# Patient Record
Sex: Female | Born: 1985 | Race: White | Hispanic: No | Marital: Married | Smoking: Former smoker
Health system: Southern US, Community
[De-identification: ages and names within clinical notes are randomized; demographics above are authoritative.]

## PROBLEM LIST (undated history)

## (undated) ENCOUNTER — Inpatient Hospital Stay (HOSPITAL_COMMUNITY): Payer: Self-pay

## (undated) ENCOUNTER — Inpatient Hospital Stay (HOSPITAL_COMMUNITY): Admission: AD | Payer: Federal, State, Local not specified - Other | Source: Intra-hospital | Admitting: Psychiatry

## (undated) DIAGNOSIS — R569 Unspecified convulsions: Secondary | ICD-10-CM

## (undated) DIAGNOSIS — F1020 Alcohol dependence, uncomplicated: Secondary | ICD-10-CM

## (undated) DIAGNOSIS — J40 Bronchitis, not specified as acute or chronic: Secondary | ICD-10-CM

## (undated) DIAGNOSIS — K759 Inflammatory liver disease, unspecified: Secondary | ICD-10-CM

## (undated) DIAGNOSIS — F32A Depression, unspecified: Secondary | ICD-10-CM

## (undated) DIAGNOSIS — J189 Pneumonia, unspecified organism: Secondary | ICD-10-CM

## (undated) DIAGNOSIS — F329 Major depressive disorder, single episode, unspecified: Secondary | ICD-10-CM

## (undated) HISTORY — DX: Major depressive disorder, single episode, unspecified: F32.9

## (undated) HISTORY — DX: Depression, unspecified: F32.A

## (undated) HISTORY — DX: Unspecified convulsions: R56.9

---

## 2006-04-13 HISTORY — PX: BREAST ENHANCEMENT SURGERY: SHX7

## 2016-06-12 ENCOUNTER — Inpatient Hospital Stay (HOSPITAL_COMMUNITY)
Admission: EM | Admit: 2016-06-12 | Discharge: 2016-06-14 | DRG: 897 | Disposition: A | Discharge status: Home / Self Care | Payer: Self-pay | Attending: Internal Medicine | Admitting: Internal Medicine

## 2016-06-12 ENCOUNTER — Emergency Department (HOSPITAL_COMMUNITY): Payer: Self-pay

## 2016-06-12 ENCOUNTER — Encounter (HOSPITAL_COMMUNITY): Payer: Self-pay | Admitting: Emergency Medicine

## 2016-06-12 DIAGNOSIS — G4089 Other seizures: Secondary | ICD-10-CM | POA: Diagnosis present

## 2016-06-12 DIAGNOSIS — F10239 Alcohol dependence with withdrawal, unspecified: Secondary | ICD-10-CM

## 2016-06-12 DIAGNOSIS — F10939 Alcohol use, unspecified with withdrawal, unspecified: Secondary | ICD-10-CM

## 2016-06-12 DIAGNOSIS — F10230 Alcohol dependence with withdrawal, uncomplicated: Secondary | ICD-10-CM

## 2016-06-12 DIAGNOSIS — F1010 Alcohol abuse, uncomplicated: Secondary | ICD-10-CM | POA: Diagnosis present

## 2016-06-12 DIAGNOSIS — R569 Unspecified convulsions: Secondary | ICD-10-CM

## 2016-06-12 DIAGNOSIS — F10930 Alcohol use, unspecified with withdrawal, uncomplicated: Secondary | ICD-10-CM

## 2016-06-12 DIAGNOSIS — Z91011 Allergy to milk products: Secondary | ICD-10-CM

## 2016-06-12 DIAGNOSIS — F10231 Alcohol dependence with withdrawal delirium: Principal | ICD-10-CM | POA: Diagnosis present

## 2016-06-12 DIAGNOSIS — K7010 Alcoholic hepatitis without ascites: Secondary | ICD-10-CM | POA: Diagnosis present

## 2016-06-12 LAB — CBC WITH DIFFERENTIAL/PLATELET
BASOS PCT: 0 %
Basophils Absolute: 0 10*3/uL (ref 0.0–0.1)
EOS ABS: 0 10*3/uL (ref 0.0–0.7)
EOS PCT: 0 %
HCT: 38.3 % (ref 36.0–46.0)
HEMOGLOBIN: 13.5 g/dL (ref 12.0–15.0)
Lymphocytes Relative: 16 %
Lymphs Abs: 0.4 10*3/uL — ABNORMAL LOW (ref 0.7–4.0)
MCH: 33.4 pg (ref 26.0–34.0)
MCHC: 35.2 g/dL (ref 30.0–36.0)
MCV: 94.8 fL (ref 78.0–100.0)
Monocytes Absolute: 0.2 10*3/uL (ref 0.1–1.0)
Monocytes Relative: 8 %
Neutro Abs: 2.1 10*3/uL (ref 1.7–7.7)
Neutrophils Relative %: 76 %
PLATELETS: DECREASED 10*3/uL (ref 150–400)
RBC: 4.04 MIL/uL (ref 3.87–5.11)
RDW: 14.2 % (ref 11.5–15.5)
WBC: 2.8 10*3/uL — ABNORMAL LOW (ref 4.0–10.5)

## 2016-06-12 LAB — I-STAT CHEM 8, ED
CALCIUM ION: 1.04 mmol/L — AB (ref 1.15–1.40)
CHLORIDE: 90 mmol/L — AB (ref 101–111)
CREATININE: 0.5 mg/dL (ref 0.44–1.00)
Glucose, Bld: 97 mg/dL (ref 65–99)
HCT: 41 % (ref 36.0–46.0)
Hemoglobin: 13.9 g/dL (ref 12.0–15.0)
Potassium: 3.5 mmol/L (ref 3.5–5.1)
Sodium: 132 mmol/L — ABNORMAL LOW (ref 135–145)
TCO2: 29 mmol/L (ref 0–100)

## 2016-06-12 LAB — HEPATIC FUNCTION PANEL
ALK PHOS: 101 U/L (ref 38–126)
ALT: 154 U/L — ABNORMAL HIGH (ref 14–54)
AST: 363 U/L — AB (ref 15–41)
Albumin: 4 g/dL (ref 3.5–5.0)
BILIRUBIN DIRECT: 0.6 mg/dL — AB (ref 0.1–0.5)
BILIRUBIN TOTAL: 1.9 mg/dL — AB (ref 0.3–1.2)
Indirect Bilirubin: 1.3 mg/dL — ABNORMAL HIGH (ref 0.3–0.9)
Total Protein: 7.1 g/dL (ref 6.5–8.1)

## 2016-06-12 LAB — RAPID HIV SCREEN (HIV 1/2 AB+AG)
HIV 1/2 Antibodies: NONREACTIVE
HIV-1 P24 ANTIGEN - HIV24: NONREACTIVE

## 2016-06-12 LAB — POC URINE PREG, ED: Preg Test, Ur: NEGATIVE

## 2016-06-12 LAB — PLATELET COUNT: PLATELETS: DECREASED 10*3/uL (ref 150–400)

## 2016-06-12 LAB — ETHANOL

## 2016-06-12 MED ORDER — VITAMIN B-1 100 MG PO TABS
100.0000 mg | ORAL_TABLET | Freq: Every day | ORAL | Status: DC
  Filled 2016-06-12: qty 1

## 2016-06-12 MED ORDER — VITAMIN B-1 100 MG PO TABS
100.0000 mg | ORAL_TABLET | Freq: Every day | ORAL | Status: DC
  Administered 2016-06-12 – 2016-06-14 (×3): 100 mg via ORAL
  Filled 2016-06-12 (×3): qty 1

## 2016-06-12 MED ORDER — LORAZEPAM 1 MG PO TABS
0.0000 mg | ORAL_TABLET | Freq: Four times a day (QID) | ORAL | Status: DC
Start: 2016-06-12 — End: 2016-06-14
  Administered 2016-06-12: 4 mg via ORAL
  Administered 2016-06-13: 1 mg via ORAL
  Administered 2016-06-13 (×2): 2 mg via ORAL
  Administered 2016-06-14: 1 mg via ORAL
  Filled 2016-06-12: qty 4
  Filled 2016-06-12 (×2): qty 1
  Filled 2016-06-12 (×2): qty 2

## 2016-06-12 MED ORDER — NAPROXEN SODIUM 220 MG PO TABS: 220.0000 mg | ORAL_TABLET | Freq: Two times a day (BID) | ORAL | Status: DC | PRN

## 2016-06-12 MED ORDER — LORAZEPAM 1 MG PO TABS
1.0000 mg | ORAL_TABLET | Freq: Four times a day (QID) | ORAL | Status: DC
  Administered 2016-06-12: 1 mg via ORAL
  Filled 2016-06-12: qty 1

## 2016-06-12 MED ORDER — SODIUM CHLORIDE 0.9 % IV SOLN
INTRAVENOUS | Status: AC
  Administered 2016-06-12: 22:00:00 via INTRAVENOUS

## 2016-06-12 MED ORDER — LORAZEPAM 1 MG PO TABS
1.0000 mg | ORAL_TABLET | Freq: Three times a day (TID) | ORAL | Status: DC
  Filled 2016-06-12: qty 1

## 2016-06-12 MED ORDER — HYDROXYZINE HCL 25 MG PO TABS: 25.0000 mg | ORAL_TABLET | Freq: Four times a day (QID) | ORAL | Status: DC | PRN

## 2016-06-12 MED ORDER — FOLIC ACID 1 MG PO TABS
1.0000 mg | ORAL_TABLET | Freq: Every day | ORAL | Status: DC
  Administered 2016-06-12 – 2016-06-14 (×3): 1 mg via ORAL
  Filled 2016-06-12 (×3): qty 1

## 2016-06-12 MED ORDER — ADULT MULTIVITAMIN W/MINERALS CH
1.0000 | ORAL_TABLET | Freq: Every day | ORAL | Status: DC
  Administered 2016-06-12 – 2016-06-14 (×3): 1 via ORAL
  Filled 2016-06-12 (×3): qty 1

## 2016-06-12 MED ORDER — LORAZEPAM 1 MG PO TABS: 0.0000 mg | ORAL_TABLET | Freq: Two times a day (BID) | ORAL | Status: DC

## 2016-06-12 MED ORDER — ONDANSETRON HCL 4 MG/2ML IJ SOLN: 4.0000 mg | Freq: Four times a day (QID) | INTRAMUSCULAR | Status: DC | PRN

## 2016-06-12 MED ORDER — LORAZEPAM 1 MG PO TABS: 1.0000 mg | ORAL_TABLET | Freq: Four times a day (QID) | ORAL | Status: DC | PRN

## 2016-06-12 MED ORDER — ADULT MULTIVITAMIN W/MINERALS CH
1.0000 | ORAL_TABLET | Freq: Every day | ORAL | Status: DC
  Administered 2016-06-12: 1 via ORAL
  Filled 2016-06-12: qty 1

## 2016-06-12 MED ORDER — VITAMIN B-1 100 MG PO TABS
100.0000 mg | ORAL_TABLET | Freq: Every day | ORAL | Status: DC
  Administered 2016-06-12: 100 mg via ORAL

## 2016-06-12 MED ORDER — ONDANSETRON HCL 4 MG PO TABS: 4.0000 mg | ORAL_TABLET | Freq: Four times a day (QID) | ORAL | Status: DC | PRN

## 2016-06-12 MED ORDER — LORAZEPAM 2 MG/ML IJ SOLN: 1.0000 mg | Freq: Four times a day (QID) | INTRAMUSCULAR | Status: DC | PRN

## 2016-06-12 MED ORDER — LORAZEPAM 1 MG PO TABS: 1.0000 mg | ORAL_TABLET | Freq: Every day | ORAL | Status: DC

## 2016-06-12 MED ORDER — LOPERAMIDE HCL 2 MG PO CAPS: 2.0000 mg | ORAL_CAPSULE | ORAL | Status: DC | PRN

## 2016-06-12 MED ORDER — LORAZEPAM 1 MG PO TABS: 1.0000 mg | ORAL_TABLET | Freq: Two times a day (BID) | ORAL | Status: DC

## 2016-06-12 MED ORDER — THIAMINE HCL 100 MG/ML IJ SOLN
100.0000 mg | Freq: Once | INTRAMUSCULAR | Status: DC
  Filled 2016-06-12: qty 2

## 2016-06-12 MED ORDER — SODIUM CHLORIDE 0.9% FLUSH
3.0000 mL | Freq: Two times a day (BID) | INTRAVENOUS | Status: DC
  Administered 2016-06-12 – 2016-06-14 (×3): 3 mL via INTRAVENOUS

## 2016-06-12 MED ORDER — THIAMINE HCL 100 MG/ML IJ SOLN: 100.0000 mg | Freq: Every day | INTRAMUSCULAR | Status: DC

## 2016-06-12 MED ORDER — ONDANSETRON 4 MG PO TBDP: 4.0000 mg | ORAL_TABLET | Freq: Four times a day (QID) | ORAL | Status: DC | PRN

## 2016-06-12 NOTE — ED Notes (Signed)
Attempted to call report
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
## 2016-06-13 DIAGNOSIS — F1010 Alcohol abuse, uncomplicated: Secondary | ICD-10-CM

## 2016-06-13 DIAGNOSIS — F10230 Alcohol dependence with withdrawal, uncomplicated: Secondary | ICD-10-CM

## 2016-06-13 DIAGNOSIS — R569 Unspecified convulsions: Secondary | ICD-10-CM

## 2016-06-13 DIAGNOSIS — F10239 Alcohol dependence with withdrawal, unspecified: Secondary | ICD-10-CM

## 2016-06-13 NOTE — Care Management Note (Signed)
Case Management Note  Patient Details  Name: Charlotterose Nissen MRN: [id number] Date of Birth: [birth year]  Subjective/Objective:  Receieved CM consult for outpt Substance Abuse treatment. Made referral to CSW.                   Action/Plan:CM will follow closely for disposition/discharge needs.    Expected Discharge Date:                  Expected Discharge Plan:  Home/Self Care  In-House Referral:  Clinical Social Work  Discharge planning Services  CM Consult  Post Acute Care Choice:    Choice offered to:     DME Arranged:    DME Agency:     HH Arranged:    HH Agency:     Status of Service:  In process, will continue to follow  If discussed at Long Length of Stay Meetings, dates discussed:    Additional Comments:  Delrae Sawyers, RN 06/13/2016, 4:25 PM
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
## 2016-06-14 LAB — COMPREHENSIVE METABOLIC PANEL
ALK PHOS: 106 U/L (ref 38–126)
ALT: 116 U/L — ABNORMAL HIGH (ref 14–54)
ANION GAP: 9 (ref 5–15)
AST: 194 U/L — ABNORMAL HIGH (ref 15–41)
Albumin: 3.6 g/dL (ref 3.5–5.0)
BILIRUBIN TOTAL: 1.7 mg/dL — AB (ref 0.3–1.2)
BUN: <5 mg/dL — ABNORMAL LOW (ref 6–20)
CALCIUM: 9.8 mg/dL (ref 8.9–10.3)
CO2: 28 mmol/L (ref 22–32)
Chloride: 99 mmol/L — ABNORMAL LOW (ref 101–111)
Creatinine, Ser: 0.65 mg/dL (ref 0.44–1.00)
Glucose, Bld: 120 mg/dL — ABNORMAL HIGH (ref 65–99)
Potassium: 4.9 mmol/L (ref 3.5–5.1)
Sodium: 136 mmol/L (ref 135–145)
TOTAL PROTEIN: 7.1 g/dL (ref 6.5–8.1)

## 2016-06-14 NOTE — Progress Notes (Signed)
Nsg Discharge Note  Admit Date:  06/12/2016 Discharge date: 06/14/2016   Sylvia Maxwell to be D/C'd Home per MD order.  AVS completed.  Copy for chart, and copy for patient signed, and dated. Patient/caregiver able to verbalize understanding.  Discharge Medication: Allergies as of 06/14/2016      Reactions   Lactose Intolerance (gi) Diarrhea, Nausea And Vomiting   Depends on the type of dairy-based product consumed as to which reaction occurs      Medication List    TAKE these medications   acetaminophen 325 MG tablet Commonly known as:  TYLENOL Take 325-650 mg by mouth every 6 (six) hours as needed (for headaches).   ALEVE 220 MG tablet Generic drug:  naproxen sodium Take 220-440 mg by mouth 2 (two) times daily as needed (for headaches).   b complex vitamins tablet Take 1 tablet by mouth daily.   folic acid A999333 MCG tablet Commonly known as:  FOLVITE Take 400-800 mcg by mouth daily.   ibuprofen 200 MG tablet Commonly known as:  ADVIL,MOTRIN Take 800 mg by mouth every 6 (six) hours as needed (for headaches).   MAGNESIUM PO Take 1 tablet by mouth daily.   multivitamin with minerals Tabs tablet Take 1 tablet by mouth daily.       Discharge Assessment: Vitals:   06/14/16 0658 06/14/16 1250  BP: 108/77 119/81  Pulse: 71 81  Resp: 18 18  Temp: 97.9 F (36.6 C) 98.5 F (36.9 C)   Skin clean, dry and intact without evidence of skin break down, no evidence of skin tears noted. IV catheter discontinued intact. Site without signs and symptoms of complications - no redness or edema noted at insertion site, patient denies c/o pain - only slight tenderness at site.  Dressing with slight pressure applied.  D/c Instructions-Education: Discharge instructions given to patient/family with verbalized understanding. D/c education completed with patient/family including follow up instructions, medication list, d/c activities limitations if indicated, with other d/c  instructions as indicated by MD - patient able to verbalize understanding, all questions fully answered. Patient instructed to return to ED, call 911, or call MD for any changes in condition.  Patient escorted via [place], and D/C home via private auto.  Salley Slaughter, RN 06/14/2016 3:07 PM

## 2016-06-14 NOTE — Discharge Instructions (Signed)
Avoid driving until cleared by PCP
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
## 2016-08-02 ENCOUNTER — Emergency Department (HOSPITAL_COMMUNITY)
Admission: EM | Admit: 2016-08-02 | Discharge: 2016-08-02 | Disposition: A | Discharge status: Home / Self Care | Payer: Self-pay | Attending: Emergency Medicine | Admitting: Emergency Medicine

## 2016-08-02 ENCOUNTER — Encounter (HOSPITAL_COMMUNITY): Payer: Self-pay

## 2016-08-02 DIAGNOSIS — Z79899 Other long term (current) drug therapy: Secondary | ICD-10-CM | POA: Insufficient documentation

## 2016-08-02 DIAGNOSIS — F17210 Nicotine dependence, cigarettes, uncomplicated: Secondary | ICD-10-CM | POA: Insufficient documentation

## 2016-08-02 DIAGNOSIS — Y999 Unspecified external cause status: Secondary | ICD-10-CM | POA: Insufficient documentation

## 2016-08-02 DIAGNOSIS — Z7289 Other problems related to lifestyle: Secondary | ICD-10-CM

## 2016-08-02 DIAGNOSIS — R0981 Nasal congestion: Secondary | ICD-10-CM | POA: Insufficient documentation

## 2016-08-02 DIAGNOSIS — Y92410 Unspecified street and highway as the place of occurrence of the external cause: Secondary | ICD-10-CM | POA: Insufficient documentation

## 2016-08-02 DIAGNOSIS — F1010 Alcohol abuse, uncomplicated: Secondary | ICD-10-CM | POA: Insufficient documentation

## 2016-08-02 DIAGNOSIS — Y939 Activity, unspecified: Secondary | ICD-10-CM | POA: Insufficient documentation

## 2016-08-02 DIAGNOSIS — F1090 Alcohol use, unspecified, uncomplicated: Secondary | ICD-10-CM

## 2016-08-02 DIAGNOSIS — S301XXA Contusion of abdominal wall, initial encounter: Secondary | ICD-10-CM | POA: Insufficient documentation

## 2016-08-02 MED ORDER — LORATADINE 10 MG PO TABS: 10.0000 mg | ORAL_TABLET | Freq: Every day | ORAL | 0 refills | Status: DC

## 2016-08-02 MED ORDER — IBUPROFEN 800 MG PO TABS: 800.0000 mg | ORAL_TABLET | Freq: Three times a day (TID) | ORAL | 0 refills | Status: DC | PRN

## 2016-08-02 NOTE — ED Notes (Addendum)
Dr.Knott at bedside. 
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
## 2016-08-02 NOTE — ED Notes (Signed)
Pt given ice pack
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
## 2016-09-27 ENCOUNTER — Encounter (HOSPITAL_COMMUNITY): Payer: Self-pay

## 2016-09-27 ENCOUNTER — Emergency Department (HOSPITAL_COMMUNITY): Payer: Self-pay

## 2016-09-27 ENCOUNTER — Emergency Department (HOSPITAL_COMMUNITY)
Admission: EM | Admit: 2016-09-27 | Discharge: 2016-09-27 | Disposition: A | Discharge status: Home / Self Care | Payer: Self-pay | Attending: Emergency Medicine | Admitting: Emergency Medicine

## 2016-09-27 DIAGNOSIS — W010XXA Fall on same level from slipping, tripping and stumbling without subsequent striking against object, initial encounter: Secondary | ICD-10-CM | POA: Insufficient documentation

## 2016-09-27 DIAGNOSIS — S63502A Unspecified sprain of left wrist, initial encounter: Secondary | ICD-10-CM

## 2016-09-27 DIAGNOSIS — Y999 Unspecified external cause status: Secondary | ICD-10-CM | POA: Insufficient documentation

## 2016-09-27 DIAGNOSIS — F17210 Nicotine dependence, cigarettes, uncomplicated: Secondary | ICD-10-CM | POA: Insufficient documentation

## 2016-09-27 DIAGNOSIS — Y9301 Activity, walking, marching and hiking: Secondary | ICD-10-CM | POA: Insufficient documentation

## 2016-09-27 DIAGNOSIS — Y929 Unspecified place or not applicable: Secondary | ICD-10-CM | POA: Insufficient documentation

## 2016-09-27 DIAGNOSIS — M25532 Pain in left wrist: Secondary | ICD-10-CM

## 2016-09-27 DIAGNOSIS — W19XXXA Unspecified fall, initial encounter: Secondary | ICD-10-CM

## 2016-09-27 DIAGNOSIS — Z79899 Other long term (current) drug therapy: Secondary | ICD-10-CM | POA: Insufficient documentation

## 2016-09-27 MED ORDER — HYDROCODONE-ACETAMINOPHEN 5-325 MG PO TABS: 1.0000 | ORAL_TABLET | Freq: Four times a day (QID) | ORAL | 0 refills | Status: DC | PRN

## 2016-09-27 NOTE — ED Triage Notes (Signed)
Onset 1 hour PTA pt was walking two dogs and got tangled up and pt fell.  Obvious deformity to left wrist.
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
## 2016-09-27 NOTE — ED Notes (Signed)
Waiting on ortho 

## 2016-09-27 NOTE — Discharge Instructions (Signed)
Do not take your prescription pain medication tonight.  Please take Ibuprofen (Advil, motrin) and Tylenol (acetaminophen) to relieve your pain.  You may take up to 800 MG (4 pills) of normal strength ibuprofen every 8 hours as needed.  In between doses of ibuprofen you make take tylenol, up to 1,000 mg (two extra strength pills).  Do not take more than 3,000 mg tylenol in a 24 hour period.  Please check all medication labels as many medications such as pain and cold medications may contain tylenol.  Do not drink alcohol while taking these medications.  Do not take other NSAID'S while taking ibuprofen (such as aleve or naproxen).  Please take the ibuprofen with food to reduce stomach upset. Please be aware that your prescription pain medication contains Tylenol which needs to be counted in your daily allowance. Please only take a prescription pain medication if your pain is unable to be controlled with ibuprofen, Tylenol, ice, and elevation.   You are being prescribed a medication which may make you sleepy. Please please do not drive, operate heavy machinery, care for a small child with out another adult present, or perform any activities that may cause harm to you or someone else if you were to fall asleep or be impaired for 24 hours after one dose until you know how it effects you.

## 2016-09-27 NOTE — Progress Notes (Signed)
Orthopedic Tech Progress Note Patient Details:  Sylvia Maxwell [facility] 1985-08-27 [id number]  Ortho Devices Type of Ortho Device: Thumb velcro splint Ortho Device/Splint Location: lue Ortho Device/Splint Interventions: Ordered, Application, Adjustment   Karolee Stamps 09/27/2016, 9:30 PM

## 2017-01-29 ENCOUNTER — Encounter (HOSPITAL_COMMUNITY): Payer: Self-pay

## 2017-01-29 ENCOUNTER — Emergency Department (HOSPITAL_COMMUNITY)
Admission: EM | Admit: 2017-01-29 | Discharge: 2017-01-31 | Disposition: A | Discharge status: Home / Self Care | Payer: Self-pay | Attending: Emergency Medicine | Admitting: Emergency Medicine

## 2017-01-29 DIAGNOSIS — Z79899 Other long term (current) drug therapy: Secondary | ICD-10-CM | POA: Insufficient documentation

## 2017-01-29 DIAGNOSIS — Y908 Blood alcohol level of 240 mg/100 ml or more: Secondary | ICD-10-CM | POA: Insufficient documentation

## 2017-01-29 DIAGNOSIS — F17210 Nicotine dependence, cigarettes, uncomplicated: Secondary | ICD-10-CM | POA: Insufficient documentation

## 2017-01-29 DIAGNOSIS — F10920 Alcohol use, unspecified with intoxication, uncomplicated: Secondary | ICD-10-CM

## 2017-01-29 DIAGNOSIS — R45851 Suicidal ideations: Secondary | ICD-10-CM | POA: Insufficient documentation

## 2017-01-29 DIAGNOSIS — F329 Major depressive disorder, single episode, unspecified: Secondary | ICD-10-CM | POA: Insufficient documentation

## 2017-01-29 DIAGNOSIS — F10239 Alcohol dependence with withdrawal, unspecified: Secondary | ICD-10-CM | POA: Diagnosis present

## 2017-01-29 DIAGNOSIS — F332 Major depressive disorder, recurrent severe without psychotic features: Secondary | ICD-10-CM

## 2017-01-29 LAB — SALICYLATE LEVEL: Salicylate Lvl: <7 mg/dL (ref 2.8–30.0)

## 2017-01-29 LAB — CBC
HCT: 42.8 % (ref 36.0–46.0)
HEMOGLOBIN: 15 g/dL (ref 12.0–15.0)
MCH: 31.1 pg (ref 26.0–34.0)
MCHC: 35 g/dL (ref 30.0–36.0)
MCV: 88.6 fL (ref 78.0–100.0)
Platelets: 302 10*3/uL (ref 150–400)
RBC: 4.83 MIL/uL (ref 3.87–5.11)
RDW: 12.9 % (ref 11.5–15.5)
WBC: 6.3 10*3/uL (ref 4.0–10.5)

## 2017-01-29 LAB — COMPREHENSIVE METABOLIC PANEL
ALBUMIN: 4.2 g/dL (ref 3.5–5.0)
ALT: 25 U/L (ref 14–54)
AST: 30 U/L (ref 15–41)
Alkaline Phosphatase: 60 U/L (ref 38–126)
Anion gap: 11 (ref 5–15)
BUN: 13 mg/dL (ref 6–20)
CHLORIDE: 104 mmol/L (ref 101–111)
CO2: 26 mmol/L (ref 22–32)
CREATININE: 0.6 mg/dL (ref 0.44–1.00)
Calcium: 8.8 mg/dL — ABNORMAL LOW (ref 8.9–10.3)
GFR calc Af Amer: >60 mL/min (ref >60)
GFR calc non Af Amer: >60 mL/min (ref >60)
GLUCOSE: 100 mg/dL — AB (ref 65–99)
POTASSIUM: 4 mmol/L (ref 3.5–5.1)
SODIUM: 141 mmol/L (ref 135–145)
Total Bilirubin: 0.5 mg/dL (ref 0.3–1.2)
Total Protein: 7.9 g/dL (ref 6.5–8.1)

## 2017-01-29 LAB — ACETAMINOPHEN LEVEL: Acetaminophen (Tylenol), Serum: <10 ug/mL — ABNORMAL LOW (ref 10–30)

## 2017-01-29 LAB — I-STAT BETA HCG BLOOD, ED (MC, WL, AP ONLY): I-stat hCG, quantitative: <5 m[IU]/mL (ref <5)

## 2017-01-29 LAB — ETHANOL: Alcohol, Ethyl (B): 395 mg/dL (ref <10)

## 2017-01-29 MED ORDER — NICOTINE 21 MG/24HR TD PT24
21.0000 mg | MEDICATED_PATCH | Freq: Every day | TRANSDERMAL | Status: DC
  Filled 2017-01-29: qty 1

## 2017-01-29 MED ORDER — LORAZEPAM 2 MG/ML IJ SOLN: 0.0000 mg | Freq: Two times a day (BID) | INTRAMUSCULAR | Status: DC

## 2017-01-29 MED ORDER — LORAZEPAM 2 MG/ML IJ SOLN
0.0000 mg | Freq: Four times a day (QID) | INTRAMUSCULAR | Status: DC
  Administered 2017-01-30: 2 mg via INTRAVENOUS
  Filled 2017-01-29: qty 1

## 2017-01-29 MED ORDER — LORAZEPAM 1 MG PO TABS: 0.0000 mg | ORAL_TABLET | Freq: Two times a day (BID) | ORAL | Status: DC

## 2017-01-29 MED ORDER — THIAMINE HCL 100 MG/ML IJ SOLN: 100.0000 mg | Freq: Every day | INTRAMUSCULAR | Status: DC

## 2017-01-29 MED ORDER — ACETAMINOPHEN 325 MG PO TABS
650.0000 mg | ORAL_TABLET | ORAL | Status: DC | PRN
  Administered 2017-01-30 (×2): 650 mg via ORAL
  Filled 2017-01-29 (×2): qty 2

## 2017-01-29 MED ORDER — LORAZEPAM 1 MG PO TABS
0.0000 mg | ORAL_TABLET | Freq: Four times a day (QID) | ORAL | Status: DC
  Administered 2017-01-30 (×3): 1 mg via ORAL
  Filled 2017-01-29 (×3): qty 1

## 2017-01-29 MED ORDER — ONDANSETRON 8 MG PO TBDP
8.0000 mg | ORAL_TABLET | Freq: Once | ORAL | Status: AC
  Administered 2017-01-29: 8 mg via ORAL
  Filled 2017-01-29: qty 1

## 2017-01-29 MED ORDER — VITAMIN B-1 100 MG PO TABS
100.0000 mg | ORAL_TABLET | Freq: Every day | ORAL | Status: DC
  Administered 2017-01-30 – 2017-01-31 (×2): 100 mg via ORAL
  Filled 2017-01-29 (×2): qty 1
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
## 2017-01-29 NOTE — ED Notes (Signed)
Bed: [facility]4 Expected date:  Expected time:  Means of arrival:  Comments: 

## 2017-01-30 DIAGNOSIS — F10239 Alcohol dependence with withdrawal, unspecified: Secondary | ICD-10-CM

## 2017-01-30 DIAGNOSIS — F332 Major depressive disorder, recurrent severe without psychotic features: Secondary | ICD-10-CM

## 2017-01-30 LAB — RAPID URINE DRUG SCREEN, HOSP PERFORMED
Amphetamines: NOT DETECTED
BARBITURATES: NOT DETECTED
Benzodiazepines: POSITIVE — AB
Cocaine: NOT DETECTED
Opiates: NOT DETECTED
Tetrahydrocannabinol: NOT DETECTED

## 2017-01-30 MED ORDER — PSEUDOEPHEDRINE HCL ER 120 MG PO TB12
120.0000 mg | ORAL_TABLET | Freq: Two times a day (BID) | ORAL | Status: DC | PRN
  Administered 2017-01-30: 120 mg via ORAL
  Filled 2017-01-30: qty 1

## 2017-01-30 MED ORDER — LORAZEPAM 1 MG PO TABS
1.0000 mg | ORAL_TABLET | Freq: Once | ORAL | Status: AC
Start: 2017-01-30 — End: 2017-01-30
  Administered 2017-01-30: 1 mg via ORAL
  Filled 2017-01-30: qty 1

## 2017-01-30 MED ORDER — ONDANSETRON 4 MG PO TBDP
4.0000 mg | ORAL_TABLET | Freq: Three times a day (TID) | ORAL | Status: DC | PRN
  Administered 2017-01-30: 4 mg via ORAL
  Filled 2017-01-30: qty 1

## 2017-01-30 NOTE — ED Notes (Signed)
Pt agreed to stay overnight per dr Cathleen Fears.  TTS aware

## 2017-01-30 NOTE — ED Notes (Signed)
Ambulatory w/o difficulty from room 26, scrubs changed on arrival.

## 2017-01-30 NOTE — Consult Note (Addendum)
[facility] Consult   Reason for Consult:  1. Alcohol withdrawal 2. Major Depression Disorder, recurrent, severe without psychosis 3. Suicidal ideation with plan  Referring Physician:  EDP Patient Identification: Sylvia Maxwell MRN:  [id number] Principal Diagnosis: Major depressive disorder, recurrent, severe without psychosis Diagnosis:   Patient Active Problem List   Diagnosis Date Noted  . Alcohol dependence with withdrawal with complication ([place]) [B01.751] 01/30/2017    Priority: High  . MDD (major depressive disorder), recurrent severe, without psychosis ([place]) [F33.2] 01/30/2017    Priority: High  . Alcohol withdrawal seizure ([place]) [W25.852, R56.9] 06/13/2016  . Seizure ([place]) [R56.9]   . ETOH abuse [F10.10] 06/12/2016  . Alcohol withdrawal seizure without complication ([place]) [D78.242] 06/12/2016    Total Time spent with patient: 45 minutes  Subjective:   Sylvia Maxwell is a 31 y.o. female patient admitted with to the ER after significant acute intoxication and verbalization of suicidal ideation on admission.  HPI:   Patient is a 31 year old caucasian female who presents to the ER following "a bender for the last few days". Patient states increasing alcohol intake since a music concert on Tuesday (4 days total of heavy drinking). Patient notes that she had been feeling depressed regarding her prior home history and current life situation. Patients boyfriend Sylvia Maxwell issued IVC paperwork and brought her to the ER for further evaluation by medical professionals. Patient notes a prior attempt at hurting herself 8-9 years ago with alcohol but stopped as she felt that it would be a selfish act. Patient notes she also has a prior history of alcohol withdrawal seizures. Patient expresses a desire to feel better about herself before medical provider left patients room.  No homicidal ideations, hallucinations.  Tremors fine on assessment, Ativan alcohol detox  protocol in place  Past Psychiatric History: Denies  Risk to Self:None Risk to Others: None Prior Inpatient Therapy: Prior Inpatient Therapy: No Prior Therapy Dates: NA Prior Therapy Facilty/Provider(s): NA Reason for Treatment: NA Prior Outpatient Therapy: Prior Outpatient Therapy: No Prior Therapy Dates: NA Prior Therapy Facilty/Provider(s): NA Reason for Treatment: NA Does patient have an ACCT team?: No Does patient have Intensive In-House Services?  : No Does patient have [facility] services? : No Does patient have P4CC services?: No  Past Medical History: History reviewed. No pertinent past medical history.  Past Surgical History:  Procedure Laterality Date  . BREAST ENHANCEMENT SURGERY  2008   Family History: History reviewed. No pertinent family history. Family Psychiatric  History: substance abuse Social History:  History  Alcohol Use  . Yes    Comment: 1 bottle of wine per day and "shots"     History  Drug Use No    Social History   Social History  . Marital status: Significant Other    Spouse name: N/A  . Number of children: N/A  . Years of education: N/A   Social History Main Topics  . Smoking status: Current Some Day Smoker    Types: Cigarettes  . Smokeless tobacco: Never Used  . Alcohol use Yes     Comment: 1 bottle of wine per day and "shots"  . Drug use: No  . Sexual activity: Yes   Other Topics Concern  . None   Social History Narrative  . None   Additional Social History:    Allergies:   Allergies  Allergen Reactions  . Lactose Intolerance (Gi) Diarrhea and Nausea And Vomiting    Depends on the type of dairy-based product  consumed as to which reaction occurs    Labs:  Results for orders placed or performed during the hospital encounter of 01/29/17 (from the past 48 hour(s))  Rapid urine drug screen (hospital performed)     Status: Abnormal   Collection Time: 01/29/17 10:27 PM  Result Value Ref Range   Opiates NONE DETECTED NONE  DETECTED   Cocaine NONE DETECTED NONE DETECTED   Benzodiazepines POSITIVE (A) NONE DETECTED   Amphetamines NONE DETECTED NONE DETECTED   Tetrahydrocannabinol NONE DETECTED NONE DETECTED   Barbiturates NONE DETECTED NONE DETECTED    Comment:        DRUG SCREEN FOR MEDICAL PURPOSES ONLY.  IF CONFIRMATION IS NEEDED FOR ANY PURPOSE, NOTIFY LAB WITHIN 5 DAYS.        LOWEST DETECTABLE LIMITS FOR URINE DRUG SCREEN Drug Class       Cutoff (ng/mL) Amphetamine      1000 Barbiturate      200 Benzodiazepine   998 Tricyclics       338 Opiates          300 Cocaine          300 THC              50   Comprehensive metabolic panel     Status: Abnormal   Collection Time: 01/29/17 10:29 PM  Result Value Ref Range   Sodium 141 135 - 145 mmol/L   Potassium 4.0 3.5 - 5.1 mmol/L   Chloride 104 101 - 111 mmol/L   CO2 26 22 - 32 mmol/L   Glucose, Bld 100 (H) 65 - 99 mg/dL   BUN 13 6 - 20 mg/dL   Creatinine, Ser 0.60 0.44 - 1.00 mg/dL   Calcium 8.8 (L) 8.9 - 10.3 mg/dL   Total Protein 7.9 6.5 - 8.1 g/dL   Albumin 4.2 3.5 - 5.0 g/dL   AST 30 15 - 41 U/L   ALT 25 14 - 54 U/L   Alkaline Phosphatase 60 38 - 126 U/L   Total Bilirubin 0.5 0.3 - 1.2 mg/dL   GFR calc non Af Amer >60 >60 mL/min   GFR calc Af Amer >60 >60 mL/min    Comment: (NOTE) The eGFR has been calculated using the CKD EPI equation. This calculation has not been validated in all clinical situations. eGFR's persistently <60 mL/min signify possible Chronic Kidney Disease.    Anion gap 11 5 - 15  Ethanol     Status: Abnormal   Collection Time: 01/29/17 10:29 PM  Result Value Ref Range   Alcohol, Ethyl (B) 395 (HH) <10 mg/dL    Comment:        LOWEST DETECTABLE LIMIT FOR SERUM ALCOHOL IS 10 mg/dL FOR MEDICAL PURPOSES ONLY CRITICAL RESULT CALLED TO, READ BACK BY AND VERIFIED WITH: TALKINGTON,J [id number] COVINGTON,N   Salicylate level     Status: None   Collection Time: 01/29/17 10:29 PM  Result Value Ref Range    Salicylate Lvl <7.6 2.8 - 30.0 mg/dL  Acetaminophen level     Status: Abnormal   Collection Time: 01/29/17 10:29 PM  Result Value Ref Range   Acetaminophen (Tylenol), Serum <10 (L) 10 - 30 ug/mL    Comment:        THERAPEUTIC CONCENTRATIONS VARY SIGNIFICANTLY. A RANGE OF 10-30 ug/mL MAY BE AN EFFECTIVE CONCENTRATION FOR MANY PATIENTS. HOWEVER, SOME ARE BEST TREATED AT CONCENTRATIONS OUTSIDE THIS RANGE. ACETAMINOPHEN CONCENTRATIONS >150 ug/mL AT 4 HOURS AFTER INGESTION AND >50 ug/mL AT 12  HOURS AFTER INGESTION ARE OFTEN ASSOCIATED WITH TOXIC REACTIONS.   cbc     Status: None   Collection Time: 01/29/17 10:29 PM  Result Value Ref Range   WBC 6.3 4.0 - 10.5 K/uL   RBC 4.83 3.87 - 5.11 MIL/uL   Hemoglobin 15.0 12.0 - 15.0 g/dL   HCT 42.8 36.0 - 46.0 %   MCV 88.6 78.0 - 100.0 fL   MCH 31.1 26.0 - 34.0 pg   MCHC 35.0 30.0 - 36.0 g/dL   RDW 12.9 11.5 - 15.5 %   Platelets 302 150 - 400 K/uL  I-Stat beta hCG blood, ED     Status: None   Collection Time: 01/29/17 10:40 PM  Result Value Ref Range   I-stat hCG, quantitative <5.0 <5 mIU/mL   Comment 3            Comment:   GEST. AGE      CONC.  (mIU/mL)   <=1 WEEK        5 - 50     2 WEEKS       50 - 500     3 WEEKS       100 - 10,000     4 WEEKS     1,000 - 30,000        FEMALE AND NON-PREGNANT FEMALE:     LESS THAN 5 mIU/mL     Current Facility-Administered Medications  Medication Dose Route Frequency Provider Last Rate Last Dose  . acetaminophen (TYLENOL) tablet 650 mg  650 mg Oral Q4H PRN Antonietta Breach, PA-C   650 mg at 01/30/17 0839  . LORazepam (ATIVAN) injection 0-4 mg  0-4 mg Intravenous Q6H Antonietta Breach, PA-C   2 mg at 01/30/17 0110   Or  . LORazepam (ATIVAN) tablet 0-4 mg  0-4 mg Oral Q6H Antonietta Breach, PA-C      . [START ON 02/01/2017] LORazepam (ATIVAN) injection 0-4 mg  0-4 mg Intravenous Q12H Antonietta Breach, PA-C       Or  . Derrill Memo ON 02/01/2017] LORazepam (ATIVAN) tablet 0-4 mg  0-4 mg Oral Q12H Humes, Kelly, PA-C       . nicotine (NICODERM CQ - dosed in mg/24 hours) patch 21 mg  21 mg Transdermal Daily Antonietta Breach, PA-C      . ondansetron (ZOFRAN-ODT) disintegrating tablet 4 mg  4 mg Oral Q8H PRN Dorie Rank, MD   4 mg at 01/30/17 7048  . thiamine (VITAMIN B-1) tablet 100 mg  100 mg Oral Daily Antonietta Breach, PA-C       Or  . thiamine (B-1) injection 100 mg  100 mg Intravenous Daily Antonietta Breach, PA-C       Current Outpatient Prescriptions  Medication Sig Dispense Refill  . b complex vitamins tablet Take 1 tablet by mouth daily.    . folic acid (FOLVITE) 889 MCG tablet Take 400-800 mcg by mouth daily.    Marland Kitchen MAGNESIUM PO Take 1 tablet by mouth daily.    . Multiple Vitamin (MULTIVITAMIN WITH MINERALS) TABS tablet Take 1 tablet by mouth daily.    . naproxen sodium (ALEVE) 220 MG tablet Take 220-440 mg by mouth 2 (two) times daily as needed (for headaches).    Marland Kitchen HYDROcodone-acetaminophen (NORCO/VICODIN) 5-325 MG tablet Take 1 tablet by mouth every 6 (six) hours as needed for severe pain. (Patient not taking: Reported on 01/29/2017) 10 tablet 0  . ibuprofen (ADVIL,MOTRIN) 800 MG tablet Take 1 tablet (800 mg total) by mouth every 8 (  eight) hours as needed for mild pain or moderate pain. (Patient not taking: Reported on 01/29/2017) 21 tablet 0  . loratadine (CLARITIN) 10 MG tablet Take 1 tablet (10 mg total) by mouth daily. (Patient not taking: Reported on 01/29/2017) 14 tablet 0    Musculoskeletal: Strength & Muscle Tone: within normal limits Gait & Station: normal Patient leans: Right  Psychiatric Specialty Exam: Physical Exam  Constitutional: She is oriented to person, place, and time. She appears well-developed and well-nourished.  HENT:  Head: Normocephalic.  Neck: Normal range of motion.  Respiratory: Effort normal.  Musculoskeletal: Normal range of motion.  Neurological: She is alert and oriented to person, place, and time.  Psychiatric: Her speech is normal and behavior is normal. Judgment  normal. Cognition and memory are normal. She exhibits a depressed mood. She expresses suicidal ideation. She expresses suicidal plans.    Review of Systems  Psychiatric/Behavioral: Positive for depression and suicidal ideas.  All other systems reviewed and are negative.   Blood pressure 112/73, pulse 80, temperature 98.5 F (36.9 C), temperature source Oral, resp. rate 16, height '5\' 7"'  (1.702 m), weight 59 kg (130 lb), SpO2 97 %.Body mass index is 20.36 kg/m.  General Appearance: Fairly Groomed  Eye Contact:  Minimal  Speech:  Clear and Coherent and Normal Rate  Volume:  Normal  Mood:  Depressed and Worthless  Affect:  Blunt, Depressed, Flat and Restricted  Thought Process:  Linear  Orientation:  Full (Time, Place, and Person)  Thought Content:  Logical  Suicidal Thoughts:  No  Homicidal Thoughts:  No  Memory:  Immediate;   Good Recent;   Good Remote;   Good  Judgement:  Fair  Insight:  Shallow  Psychomotor Activity:  Restlessness and Tremor  Concentration:  Concentration: Good and Attention Span: Good  Recall:  Good  Fund of Knowledge:  Good  Language:  Good  Akathisia:  No  Handed:  Right  AIMS (if indicated):     Assets:  Communication Skills Desire for Improvement Physical Health Resilience  ADL's:  Intact  Cognition:  Impaired,  Mild  Sleep:        Treatment Plan Summary: Daily contact with patient to assess and evaluate symptoms and progress in treatment  -Crisis stabilization -Medication management:  Started Ativan alcohol detox protocol,  -Individual and substance abuse counseling -Peer support referral  Disposition: Recommend psychiatric Inpatient admission when medically cleared.  Sylvia Boga, Sylvia Maxwell 01/30/2017 11:13 AM   Patient seen, chart reviewed and case discussed with the treatment team for this face-to-face psychiatric evaluation and developed treatment plan. Reviewed the information documented and agree with the treatment plan.  Sylvia Maxwell 02/02/2017 12:04 PM

## 2017-01-30 NOTE — ED Provider Notes (Addendum)
6:30 PM patient was expressing wish to leave the ED. Reportedly her IVC paperwork was rescinded earlier today by psychiatric attending. I had a lengthy conversation with the patient and she agrees to spend the night   Orlie Dakin, MD 01/30/17 2029    Orlie Dakin, MD 01/30/17 [phone]

## 2017-01-30 NOTE — ED Notes (Signed)
Up to the bathroom 

## 2017-01-30 NOTE — ED Notes (Signed)
Peer councilor into see, IVC has been rescinded

## 2017-01-30 NOTE — Patient Outreach (Signed)
ED Peer Support Specialist Patient Intake (Complete at intake & 30-60 Day Follow-up)  Name: Sylvia Maxwell  MRN: [id number]  Age: 31 y.o.   Date of Admission: 01/30/2017  Intake: Initial Comments:      Primary Reason Admitted: substance induced mood disorder, alcohol use  Lab values: Alcohol/ETOH: Positive Positive UDS? Yes Amphetamines: No Barbiturates: No Benzodiazepines: Yes Cocaine: No Opiates: No Cannabinoids: No  Demographic information: Gender: Female Ethnicity: White Marital Status: Single Insurance Status: Uninsured/Self-pay Ec[employer] (Work Neurosurgeon, Physicist, medical, etc.: No Lives with: Other (comment) (Boyfriend) Living situation: House/Apartment  Reported Patient History: Patient reported health conditions: Anxiety disorders, Depression Patient aware of HIV and hepatitis status: Yes (comment) (Negative)  In past year, has patient visited ED for any reason? Yes  Number of ED visits:    Reason(s) for visit: seuizures  In past year, has patient been hospitalized for any reason? Yes  Number of hospitalizations:    Reason(s) for hospitalization: seizures  In past year, has patient been arrested? Yes  Number of arrests: 1  Reason(s) for arrest: assault  In past year, has patient been incarcerated? Yes  Number of incarcerations: 1  Reason(s) for incarceration: assault  In past year, has patient received medication-assisted treatment?    In past year, patient received the following treatments:    In past year, has patient received any harm reduction services? No  Did this include any of the following?    In past year, has patient received care from a mental health provider for diagnosis other than SUD? No  In past year, is this first time patient has overdosed? No  Number of past overdoses: 0  In past year, is this first time patient has been hospitalized for an overdose? No  Number of hospitalizations  for overdose(s): 0  Is patient currently receiving treatment for a mental health diagnosis? No  Patient reports experiencing difficulty participating in SUD treatment: No    Most important reason(s) for this difficulty?    Has patient received prior services for treatment? No  In past, patient has received services from following agencies:    Plan of Care:  Suggested follow up at these agencies/treatment centers:  (Patient is interested in outpatient substance use treatment services. Plan to continue to work with the patient in helping her gain access to outpatient substance use treatment services.  )  Other information:   Mason Jim, CPSS  01/30/2017 4:57 PM

## 2017-01-30 NOTE — ED Notes (Signed)
TTS into see 

## 2017-01-30 NOTE — ED Notes (Signed)
Requested urine from patient. 

## 2017-01-30 NOTE — ED Notes (Signed)
Peer support in w/ pt.

## 2017-01-30 NOTE — ED Notes (Signed)
On the phone 

## 2017-01-30 NOTE — ED Notes (Signed)
SBAR Report received from previous nurse. Pt received calm and visible on unit. Pt denies current SI/ HI, A/V H, depression, anxiety, or pain at this time, and appears otherwise stable and free of distress. Pt reminded of camera surveillance, q 15 min rounds, and rules of the milieu. Will continue to assess. 
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
## 2017-01-30 NOTE — ED Notes (Signed)
Pt resting quielty, reports that she does not want to be admitted now.  Pt reports that she has a new job and is concerned that she will loose the job.

## 2017-01-30 NOTE — ED Notes (Signed)
22 g IV catheter removed from left posterior hand.  Catheter intact, Site unremarkable; gauze dressing applied.

## 2017-01-30 NOTE — ED Notes (Addendum)
Dr Winfred Leeds into see
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
## 2017-01-30 NOTE — BHH Counselor (Addendum)
Clinician contact pt's boyfriend who initiated IVC paperwork [facility], [phone]) to gather collateral information however the call ended.    Vertell Novak, MS, [facility] Triage Specialist [phone]

## 2017-01-31 ENCOUNTER — Ambulatory Visit (HOSPITAL_COMMUNITY): Payer: Self-pay

## 2017-01-31 MED ORDER — GABAPENTIN 300 MG PO CAPS
300.0000 mg | ORAL_CAPSULE | Freq: Three times a day (TID) | ORAL | Status: DC
  Administered 2017-01-31: 300 mg via ORAL
  Filled 2017-01-31: qty 1

## 2017-01-31 MED ORDER — GABAPENTIN 300 MG PO CAPS: 300.0000 mg | ORAL_CAPSULE | Freq: Three times a day (TID) | ORAL | 0 refills | Status: DC

## 2017-01-31 MED ORDER — GABAPENTIN 300 MG PO CAPS: 300.0000 mg | ORAL_CAPSULE | Freq: Three times a day (TID) | ORAL | 1 refills | Status: DC

## 2017-01-31 NOTE — ED Notes (Signed)
Pts ex-boyfriend called and spoke with this Probation officer after the patient called the ex-boyfriend telling him she was going to be released. Ex-boyfriend stated "I just want the doctor to know that if she gets released, she will be homeless and drink herself to death. She has burned all her bridges and I will not be supporting her. The only way she will get help is if it gets forced upon her." This Probation officer noted that she would pass this on but ultimately the doctor makes the final decision on discharge. [employer] [phone]. This Probation officer did not release any specific information on the patients care, information was only obtained.

## 2017-01-31 NOTE — ED Notes (Signed)
Pt was discharged per order. Pt had spoken with peer support specialist and been given resources. Boyfriend coming to pick her up. NP had been made aware of boyfriend's prior comments. This Probation officer had spoken with boyfriend, with written consent by pt, and made him aware of pt's pending discharge. Pt signed for discharge and for belongings. Pt was given discharge instructions.

## 2017-01-31 NOTE — Consult Note (Signed)
[facility] Consult   Reason for Consult:  1. Alcohol withdrawal 2. Major Depression Disorder, recurrent, severe without psychosis 3. Suicidal ideation with plan  Referring Physician:  EDP Patient Identification: Sylvia Maxwell MRN:  [id number] Principal Diagnosis: Major depressive disorder, mild Diagnosis:   Patient Active Problem List   Diagnosis Date Noted  . Alcohol dependence with withdrawal with complication ([place]) [W09.811] 01/30/2017    Priority: High  . Alcohol withdrawal seizure ([place]) [B14.782, R56.9] 06/13/2016  . Seizure ([place]) [R56.9]   . ETOH abuse [F10.10] 06/12/2016  . Alcohol withdrawal seizure without complication ([place]) [N56.213] 06/12/2016    Total Time spent with patient: 30 minutes  Subjective:   Sylvia Maxwell is a 31 y.o. female patient is stable for discharge.  HPI:   Patient is a 31 year old caucasian female who presents to the ER following "a bender for the last few days".   Yesterday, she had a bed at [facility] for alcohol detox but refused it, agreed to stay over night for monitoring any withdrawal symptoms.  Denies suicidal/homicidal ideations, hallucinations, or withdrawal symptoms.  She is willing to go to outpatient services but feels she has missed too much work at her new job already.  Stable for discharge  Past Psychiatric History: Denies  Risk to Self:None Risk to Others: None Prior Inpatient Therapy: Prior Inpatient Therapy: No Prior Therapy Dates: NA Prior Therapy Facilty/Provider(s): NA Reason for Treatment: NA Prior Outpatient Therapy: Prior Outpatient Therapy: No Prior Therapy Dates: NA Prior Therapy Facilty/Provider(s): NA Reason for Treatment: NA Does patient have an ACCT team?: No Does patient have Intensive In-House Services?  : No Does patient have [facility] services? : No Does patient have P4CC services?: No  Past Medical History: History reviewed. No pertinent past medical history.  Past  Surgical History:  Procedure Laterality Date  . BREAST ENHANCEMENT SURGERY  2008   Family History: History reviewed. No pertinent family history. Family Psychiatric  History: substance abuse Social History:  History  Alcohol Use  . Yes    Comment: 1 bottle of wine per day and "shots"     History  Drug Use No    Social History   Social History  . Marital status: Significant Other    Spouse name: N/A  . Number of children: N/A  . Years of education: N/A   Social History Main Topics  . Smoking status: Current Some Day Smoker    Types: Cigarettes  . Smokeless tobacco: Never Used  . Alcohol use Yes     Comment: 1 bottle of wine per day and "shots"  . Drug use: No  . Sexual activity: Yes   Other Topics Concern  . None   Social History Narrative  . None   Additional Social History:    Allergies:   Allergies  Allergen Reactions  . Lactose Intolerance (Gi) Diarrhea and Nausea And Vomiting    Depends on the type of dairy-based product consumed as to which reaction occurs    Labs:  Results for orders placed or performed during the hospital encounter of 01/29/17 (from the past 48 hour(s))  Rapid urine drug screen (hospital performed)     Status: Abnormal   Collection Time: 01/29/17 10:27 PM  Result Value Ref Range   Opiates NONE DETECTED NONE DETECTED   Cocaine NONE DETECTED NONE DETECTED   Benzodiazepines POSITIVE (A) NONE DETECTED   Amphetamines NONE DETECTED NONE DETECTED   Tetrahydrocannabinol NONE DETECTED NONE DETECTED   Barbiturates NONE DETECTED NONE  DETECTED    Comment:        DRUG SCREEN FOR MEDICAL PURPOSES ONLY.  IF CONFIRMATION IS NEEDED FOR ANY PURPOSE, NOTIFY LAB WITHIN 5 DAYS.        LOWEST DETECTABLE LIMITS FOR URINE DRUG SCREEN Drug Class       Cutoff (ng/mL) Amphetamine      1000 Barbiturate      200 Benzodiazepine   537 Tricyclics       943 Opiates          300 Cocaine          300 THC              50   Comprehensive metabolic panel      Status: Abnormal   Collection Time: 01/29/17 10:29 PM  Result Value Ref Range   Sodium 141 135 - 145 mmol/L   Potassium 4.0 3.5 - 5.1 mmol/L   Chloride 104 101 - 111 mmol/L   CO2 26 22 - 32 mmol/L   Glucose, Bld 100 (H) 65 - 99 mg/dL   BUN 13 6 - 20 mg/dL   Creatinine, Ser 0.60 0.44 - 1.00 mg/dL   Calcium 8.8 (L) 8.9 - 10.3 mg/dL   Total Protein 7.9 6.5 - 8.1 g/dL   Albumin 4.2 3.5 - 5.0 g/dL   AST 30 15 - 41 U/L   ALT 25 14 - 54 U/L   Alkaline Phosphatase 60 38 - 126 U/L   Total Bilirubin 0.5 0.3 - 1.2 mg/dL   GFR calc non Af Amer >60 >60 mL/min   GFR calc Af Amer >60 >60 mL/min    Comment: (NOTE) The eGFR has been calculated using the CKD EPI equation. This calculation has not been validated in all clinical situations. eGFR's persistently <60 mL/min signify possible Chronic Kidney Disease.    Anion gap 11 5 - 15  Ethanol     Status: Abnormal   Collection Time: 01/29/17 10:29 PM  Result Value Ref Range   Alcohol, Ethyl (B) 395 (HH) <10 mg/dL    Comment:        LOWEST DETECTABLE LIMIT FOR SERUM ALCOHOL IS 10 mg/dL FOR MEDICAL PURPOSES ONLY CRITICAL RESULT CALLED TO, READ BACK BY AND VERIFIED WITH: TALKINGTON,J [id number] COVINGTON,N   Salicylate level     Status: None   Collection Time: 01/29/17 10:29 PM  Result Value Ref Range   Salicylate Lvl <0.9 2.8 - 30.0 mg/dL  Acetaminophen level     Status: Abnormal   Collection Time: 01/29/17 10:29 PM  Result Value Ref Range   Acetaminophen (Tylenol), Serum <10 (L) 10 - 30 ug/mL    Comment:        THERAPEUTIC CONCENTRATIONS VARY SIGNIFICANTLY. A RANGE OF 10-30 ug/mL MAY BE AN EFFECTIVE CONCENTRATION FOR MANY PATIENTS. HOWEVER, SOME ARE BEST TREATED AT CONCENTRATIONS OUTSIDE THIS RANGE. ACETAMINOPHEN CONCENTRATIONS >150 ug/mL AT 4 HOURS AFTER INGESTION AND >50 ug/mL AT 12 HOURS AFTER INGESTION ARE OFTEN ASSOCIATED WITH TOXIC REACTIONS.   cbc     Status: None   Collection Time: 01/29/17 10:29 PM  Result Value  Ref Range   WBC 6.3 4.0 - 10.5 K/uL   RBC 4.83 3.87 - 5.11 MIL/uL   Hemoglobin 15.0 12.0 - 15.0 g/dL   HCT 42.8 36.0 - 46.0 %   MCV 88.6 78.0 - 100.0 fL   MCH 31.1 26.0 - 34.0 pg   MCHC 35.0 30.0 - 36.0 g/dL   RDW 12.9 11.5 - 15.5 %  Platelets 302 150 - 400 K/uL  I-Stat beta hCG blood, ED     Status: None   Collection Time: 01/29/17 10:40 PM  Result Value Ref Range   I-stat hCG, quantitative <5.0 <5 mIU/mL   Comment 3            Comment:   GEST. AGE      CONC.  (mIU/mL)   <=1 WEEK        5 - 50     2 WEEKS       50 - 500     3 WEEKS       100 - 10,000     4 WEEKS     1,000 - 30,000        FEMALE AND NON-PREGNANT FEMALE:     LESS THAN 5 mIU/mL     Current Facility-Administered Medications  Medication Dose Route Frequency Provider Last Rate Last Dose  . acetaminophen (TYLENOL) tablet 650 mg  650 mg Oral Q4H PRN Antonietta Breach, PA-C   650 mg at 01/30/17 1824  . LORazepam (ATIVAN) injection 0-4 mg  0-4 mg Intravenous Q6H Antonietta Breach, PA-C   2 mg at 01/30/17 0110   Or  . LORazepam (ATIVAN) tablet 0-4 mg  0-4 mg Oral Q6H Antonietta Breach, PA-C   1 mg at 01/30/17 2312  . [START ON 02/01/2017] LORazepam (ATIVAN) injection 0-4 mg  0-4 mg Intravenous Q12H Antonietta Breach, PA-C       Or  . Derrill Memo ON 02/01/2017] LORazepam (ATIVAN) tablet 0-4 mg  0-4 mg Oral Q12H Humes, Kelly, PA-C      . nicotine (NICODERM CQ - dosed in mg/24 hours) patch 21 mg  21 mg Transdermal Daily Antonietta Breach, PA-C      . ondansetron (ZOFRAN-ODT) disintegrating tablet 4 mg  4 mg Oral Q8H PRN Dorie Rank, MD   4 mg at 01/30/17 8144  . pseudoephedrine (SUDAFED) 12 hr tablet 120 mg  120 mg Oral Q12H PRN Dorie Rank, MD   120 mg at 01/30/17 1726  . thiamine (VITAMIN B-1) tablet 100 mg  100 mg Oral Daily Antonietta Breach, PA-C   100 mg at 01/31/17 8185   Or  . thiamine (B-1) injection 100 mg  100 mg Intravenous Daily Antonietta Breach, PA-C       Current Outpatient Prescriptions  Medication Sig Dispense Refill  . b complex vitamins  tablet Take 1 tablet by mouth daily.    . folic acid (FOLVITE) 631 MCG tablet Take 400-800 mcg by mouth daily.    Marland Kitchen MAGNESIUM PO Take 1 tablet by mouth daily.    . Multiple Vitamin (MULTIVITAMIN WITH MINERALS) TABS tablet Take 1 tablet by mouth daily.    . naproxen sodium (ALEVE) 220 MG tablet Take 220-440 mg by mouth 2 (two) times daily as needed (for headaches).    Marland Kitchen HYDROcodone-acetaminophen (NORCO/VICODIN) 5-325 MG tablet Take 1 tablet by mouth every 6 (six) hours as needed for severe pain. (Patient not taking: Reported on 01/29/2017) 10 tablet 0  . ibuprofen (ADVIL,MOTRIN) 800 MG tablet Take 1 tablet (800 mg total) by mouth every 8 (eight) hours as needed for mild pain or moderate pain. (Patient not taking: Reported on 01/29/2017) 21 tablet 0  . loratadine (CLARITIN) 10 MG tablet Take 1 tablet (10 mg total) by mouth daily. (Patient not taking: Reported on 01/29/2017) 14 tablet 0    Musculoskeletal: Strength & Muscle Tone: within normal limits Gait & Station: normal Patient leans: Right  Psychiatric Specialty  Exam: Physical Exam  Constitutional: She is oriented to person, place, and time. She appears well-developed and well-nourished.  HENT:  Head: Normocephalic.  Neck: Normal range of motion.  Respiratory: Effort normal.  Musculoskeletal: Normal range of motion.  Neurological: She is alert and oriented to person, place, and time.  Psychiatric: Her speech is normal and behavior is normal. Judgment and thought content normal. Her mood appears anxious. Cognition and memory are normal.    Review of Systems  Psychiatric/Behavioral: The patient is nervous/anxious.   All other systems reviewed and are negative.   Blood pressure 112/82, pulse 78, temperature 98.2 F (36.8 C), temperature source Oral, resp. rate 16, height '5\' 7"'  (1.702 m), weight 59 kg (130 lb), SpO2 99 %.Body mass index is 20.36 kg/m.  General Appearance: Casual  Eye Contact:  Good  Speech:  Clear and Coherent and  Normal Rate  Volume:  Normal  Mood:  Anxious  Affect:  Congruent   Thought Process:  Linear  Orientation:  Full (Time, Place, and Person)  Thought Content:  Logical  Suicidal Thoughts:  No  Homicidal Thoughts:  No  Memory:  Immediate;   Good Recent;   Good Remote;   Good  Judgement:  Fair  Insight:  Fair  Psychomotor Activity:  Normal  Concentration:  Concentration: Good and Attention Span: Good  Recall:  Good  Fund of Knowledge:  Good  Language:  Good  Akathisia:  No  Handed:  Right  AIMS (if indicated):     Assets:  Communication Skills Desire for Improvement Physical Health Resilience  ADL's:  Intact  Cognition:  WDL  Sleep:        Treatment Plan Summary: Daily contact with patient to assess and evaluate symptoms and progress in treatment  -Crisis stabilization -Medication management:  Continued Ativan alcohol detox protocol and started gabapentin 300 mg TID for withdrawal symptoms -Individual and substance abuse counseling -Peer support referral  Disposition: Discharge home  Waylan Boga, NP 01/31/2017 11:34 AM   Patient seen, chart reviewed and case discussed with the treatment team for this face-to-face psychiatric evaluation and developed treatment plan. Reviewed the information documented and agree with the treatment plan.  Keary Waterson 02/02/2017 12:04 PM

## 2017-01-31 NOTE — [facility] Suicide Risk Assessment (Signed)
Suicide Risk Assessment  Discharge Assessment   [facility] Discharge Suicide Risk Assessment   Principal Problem: Alcohol dependence with withdrawal with complication [facility]) Discharge Diagnoses:  Patient Active Problem List   Diagnosis Date Noted  . Alcohol dependence with withdrawal with complication ([place]) [V40.981] 01/30/2017    Priority: High  . Alcohol withdrawal seizure ([place]) [X91.478, R56.9] 06/13/2016  . Seizure ([place]) [R56.9]   . ETOH abuse [F10.10] 06/12/2016  . Alcohol withdrawal seizure without complication ([place]) [G95.621] 06/12/2016    Total Time spent with patient: 30 minutes   MusMusculoskeletal: Strength & Muscle Tone: within normal limits Gait & Station: normal Patient leans: Right  Psychiatric Specialty Exam: Physical Exam  Constitutional: She is oriented to person, place, and time. She appears well-developed and well-nourished.  HENT:  Head: Normocephalic.  Neck: Normal range of motion.  Respiratory: Effort normal.  Musculoskeletal: Normal range of motion.  Neurological: She is alert and oriented to person, place, and time.  Psychiatric: Her speech is normal and behavior is normal. Judgment and thought content normal. Her mood appears anxious. Cognition and memory are normal.    Review of Systems  Psychiatric/Behavioral: The patient is nervous/anxious.   All other systems reviewed and are negative.   Blood pressure 112/82, pulse 78, temperature 98.2 F (36.8 C), temperature source Oral, resp. rate 16, height 5\' 7"  (1.702 m), weight 59 kg (130 lb), SpO2 99 %.Body mass index is 20.36 kg/m.  General Appearance: Casual  Eye Contact:  Good  Speech:  Clear and Coherent and Normal Rate  Volume:  Normal  Mood:  Anxious  Affect:  Congruent   Thought Process:  Linear  Orientation:  Full (Time, Place, and Person)  Thought Content:  Logical  Suicidal Thoughts:  No  Homicidal Thoughts:  No  Memory:  Immediate;   Good Recent;   Good Remote;   Good  Judgement:  Fair   Insight:  Fair  Psychomotor Activity:  Normal  Concentration:  Concentration: Good and Attention Span: Good  Recall:  Good  Fund of Knowledge:  Good  Language:  Good  Akathisia:  No  Handed:  Right  AIMS (if indicated):     Assets:  Communication Skills Desire for Improvement Physical Health Resilience  ADL's:  Intact  Cognition:  WDL  Sleep:      Mental Status Per Nursing Assessment::   On Admission:   alcohol intoxication with suicidal ideations  Demographic Factors:  Caucasian  Loss Factors: Legal issues  Historical Factors: NA  Risk Reduction Factors:   Sense of responsibility to family, Employed, Living with another person, especially a relative and Positive social support  Continued Clinical Symptoms:  None   Cognitive Features That Contribute To Risk:  None    Suicide Risk:  Minimal: No identifiable suicidal ideation.  Patients presenting with no risk factors but with morbid ruminations; may be classified as minimal risk based on the severity of the depressive symptoms  Follow-up Information    [facility] Follow up.   Specialty:  Behavioral Health Why:  Please follow up for outpatient resources  Contact information: [place]  [id number] [phone]           Plan Of Care/Follow-up recommendations:  Activity:  as tolerated Diet:  heart healhty diet  Wei Poplaski, NP 01/31/2017, 11:42 AM

## 2017-02-23 ENCOUNTER — Emergency Department (HOSPITAL_BASED_OUTPATIENT_CLINIC_OR_DEPARTMENT_OTHER)
Admission: EM | Admit: 2017-02-23 | Discharge: 2017-02-23 | Disposition: A | Discharge status: Home / Self Care | Payer: Self-pay | Attending: Emergency Medicine | Admitting: Emergency Medicine

## 2017-02-23 ENCOUNTER — Encounter (HOSPITAL_BASED_OUTPATIENT_CLINIC_OR_DEPARTMENT_OTHER): Payer: Self-pay | Admitting: Emergency Medicine

## 2017-02-23 ENCOUNTER — Other Ambulatory Visit: Payer: Self-pay

## 2017-02-23 DIAGNOSIS — Y939 Activity, unspecified: Secondary | ICD-10-CM | POA: Insufficient documentation

## 2017-02-23 DIAGNOSIS — Y998 Other external cause status: Secondary | ICD-10-CM | POA: Insufficient documentation

## 2017-02-23 DIAGNOSIS — S80861A Insect bite (nonvenomous), right lower leg, initial encounter: Secondary | ICD-10-CM | POA: Insufficient documentation

## 2017-02-23 DIAGNOSIS — F17210 Nicotine dependence, cigarettes, uncomplicated: Secondary | ICD-10-CM | POA: Insufficient documentation

## 2017-02-23 DIAGNOSIS — W57XXXA Bitten or stung by nonvenomous insect and other nonvenomous arthropods, initial encounter: Secondary | ICD-10-CM | POA: Insufficient documentation

## 2017-02-23 DIAGNOSIS — L03115 Cellulitis of right lower limb: Secondary | ICD-10-CM | POA: Insufficient documentation

## 2017-02-23 DIAGNOSIS — Y929 Unspecified place or not applicable: Secondary | ICD-10-CM | POA: Insufficient documentation

## 2017-02-23 DIAGNOSIS — Z79899 Other long term (current) drug therapy: Secondary | ICD-10-CM | POA: Insufficient documentation

## 2017-02-23 HISTORY — DX: Alcohol dependence, uncomplicated: F10.20

## 2017-02-23 MED ORDER — CEPHALEXIN 500 MG PO CAPS: 500.0000 mg | ORAL_CAPSULE | Freq: Three times a day (TID) | ORAL | 0 refills | Status: DC

## 2017-02-23 MED ORDER — CEPHALEXIN 250 MG PO CAPS
ORAL_CAPSULE | ORAL | Status: AC
  Filled 2017-02-23: qty 2

## 2017-02-23 MED ORDER — CEPHALEXIN 250 MG PO CAPS
500.0000 mg | ORAL_CAPSULE | Freq: Once | ORAL | Status: AC
  Administered 2017-02-23: 500 mg via ORAL

## 2017-02-23 NOTE — ED Notes (Signed)
Contacted [facility] for transport [employer]

## 2017-02-23 NOTE — ED Triage Notes (Signed)
Pt has redness, swelling, inflammation to left lower leg.  No known fever.

## 2017-02-23 NOTE — ED Provider Notes (Signed)
[facility] Provider Note   CSN: [id number] Arrival date & time: 02/23/17  1826     History   Chief Complaint Chief Complaint  Patient presents with  . Insect Bite    HPI Sylvia Maxwell is a 31 y.o. female. Chief complaint is redness and pain to leg.  HPI 53 through female. She is being treated for substance abuse at [facility]. She noticed a red area on her leg yesterday. His become bigger. His several centimeters wide. States it is painful. No red streaking in the leg. She alleges she may been bitten by something.  Past Medical History:  Diagnosis Date  . Alcoholism [facility])     Patient Active Problem List   Diagnosis Date Noted  . Alcohol dependence with withdrawal with complication ([place]) 78/29/5621  . Alcohol withdrawal seizure ([place]) 06/13/2016  . Seizure ([place])   . ETOH abuse 06/12/2016  . Alcohol withdrawal seizure without complication ([place]) 30/86/5784    Past Surgical History:  Procedure Laterality Date  . BREAST ENHANCEMENT SURGERY  2008    OB History    No data available       Home Medications    Prior to Admission medications   Medication Sig Start Date End Date Taking? Authorizing Provider  gabapentin (NEURONTIN) 300 MG capsule Take 1 capsule (300 mg total) by mouth 3 (three) times daily. 01/31/17  Yes Patrecia Pour, NP  cephALEXin (KEFLEX) 500 MG capsule Take 1 capsule (500 mg total) 3 (three) times daily by mouth. 02/23/17   Tanna Furry, MD    Family History No family history on file.  Social History Social History   Tobacco Use  . Smoking status: Current Some Day Smoker    Types: Cigarettes  . Smokeless tobacco: Never Used  Substance Use Topics  . Alcohol use: Yes    Comment: 1 bottle of wine per day and "shots"  . Drug use: No     Allergies   Lactose intolerance (gi)   Review of Systems Review of Systems  Constitutional: Negative for appetite change, chills, diaphoresis, fatigue and  fever.  HENT: Negative for mouth sores, sore throat and trouble swallowing.   Eyes: Negative for visual disturbance.  Respiratory: Negative for cough, chest tightness, shortness of breath and wheezing.   Cardiovascular: Negative for chest pain.  Gastrointestinal: Negative for abdominal distention, abdominal pain, diarrhea, nausea and vomiting.  Endocrine: Negative for polydipsia, polyphagia and polyuria.  Genitourinary: Negative for dysuria, frequency and hematuria.  Musculoskeletal: Negative for gait problem.  Skin: Positive for color change and wound. Negative for pallor and rash.  Neurological: Negative for dizziness, syncope, light-headedness and headaches.  Hematological: Does not bruise/bleed easily.  Psychiatric/Behavioral: Negative for behavioral problems and confusion.     Physical Exam Updated Vital Signs BP 112/79 (BP Location: Right Arm)   Pulse 74   Temp 98.7 F (37.1 C)   Resp 16   Ht 5\' 7"  (1.702 m)   Wt 68.9 kg (152 lb)   LMP 02/09/2017   SpO2 100%   BMI 23.81 kg/m   Physical Exam  Constitutional: She is oriented to person, place, and time. She appears well-developed and well-nourished. No distress.  HENT:  Head: Normocephalic.  Eyes: Conjunctivae are normal. Pupils are equal, round, and reactive to light. No scleral icterus.  Neck: Normal range of motion. Neck supple. No thyromegaly present.  Cardiovascular: Normal rate and regular rhythm. Exam reveals no gallop and no friction rub.  No murmur heard. Pulmonary/Chest:  Effort normal and breath sounds normal. No respiratory distress. She has no wheezes. She has no rales.  Abdominal: Soft. Bowel sounds are normal. She exhibits no distension. There is no tenderness. There is no rebound.  Musculoskeletal: Normal range of motion.  Neurological: She is alert and oriented to person, place, and time.  Skin: Skin is warm and dry. No rash noted.  Erythema proximally 5 cm in diameter right lower leg anteriorly.  Circular. No lymphangitis. Small area of central break in the skin without eschar. Consistent with bite cellulitis. No fluctuance or abscess.  Psychiatric: She has a normal mood and affect. Her behavior is normal.     ED Treatments / Results  Labs (all labs ordered are listed, but only abnormal results are displayed) Labs Reviewed - No data to display  EKG  EKG Interpretation None       Radiology No results found.  Procedures Procedures (including critical care time)  Medications Ordered in ED Medications  cephALEXin (KEFLEX) 250 MG capsule (not administered)  cephALEXin (KEFLEX) capsule 500 mg (500 mg Oral Given 02/23/17 2056)     Initial Impression / Assessment and Plan / ED Course  I have reviewed the triage vital signs and the nursing notes.  Pertinent labs & imaging results that were available during my care of the patient were reviewed by me and considered in my medical decision making (see chart for details).    Keflex dose here. Prescription. Warm compresses. Recheck as needed.  Final Clinical Impressions(s) / ED Diagnoses   Final diagnoses:  Insect bite, initial encounter  Cellulitis of right lower extremity    ED Discharge Orders        Ordered    cephALEXin (KEFLEX) 500 MG capsule  3 times daily     02/23/17 2052       Tanna Furry, MD 02/23/17 2318

## 2017-05-20 ENCOUNTER — Emergency Department (HOSPITAL_COMMUNITY): Payer: Self-pay

## 2017-05-20 ENCOUNTER — Encounter (HOSPITAL_COMMUNITY): Payer: Self-pay

## 2017-05-20 ENCOUNTER — Inpatient Hospital Stay (HOSPITAL_COMMUNITY)
Admission: EM | Admit: 2017-05-20 | Discharge: 2017-05-27 | DRG: 917 | Disposition: A | Discharge status: Home / Self Care | Payer: Self-pay | Attending: Family Medicine | Admitting: Family Medicine

## 2017-05-20 DIAGNOSIS — R059 Cough, unspecified: Secondary | ICD-10-CM

## 2017-05-20 DIAGNOSIS — R05 Cough: Secondary | ICD-10-CM

## 2017-05-20 DIAGNOSIS — T40601A Poisoning by unspecified narcotics, accidental (unintentional), initial encounter: Secondary | ICD-10-CM

## 2017-05-20 DIAGNOSIS — Y9259 Other trade areas as the place of occurrence of the external cause: Secondary | ICD-10-CM

## 2017-05-20 DIAGNOSIS — E739 Lactose intolerance, unspecified: Secondary | ICD-10-CM | POA: Diagnosis present

## 2017-05-20 DIAGNOSIS — F1910 Other psychoactive substance abuse, uncomplicated: Secondary | ICD-10-CM

## 2017-05-20 DIAGNOSIS — F322 Major depressive disorder, single episode, severe without psychotic features: Secondary | ICD-10-CM

## 2017-05-20 DIAGNOSIS — F1010 Alcohol abuse, uncomplicated: Secondary | ICD-10-CM

## 2017-05-20 DIAGNOSIS — T401X1A Poisoning by heroin, accidental (unintentional), initial encounter: Secondary | ICD-10-CM | POA: Diagnosis present

## 2017-05-20 DIAGNOSIS — K7010 Alcoholic hepatitis without ascites: Secondary | ICD-10-CM | POA: Diagnosis present

## 2017-05-20 DIAGNOSIS — T401X2A Poisoning by heroin, intentional self-harm, initial encounter: Principal | ICD-10-CM | POA: Diagnosis present

## 2017-05-20 DIAGNOSIS — F17210 Nicotine dependence, cigarettes, uncomplicated: Secondary | ICD-10-CM | POA: Diagnosis present

## 2017-05-20 DIAGNOSIS — J189 Pneumonia, unspecified organism: Secondary | ICD-10-CM

## 2017-05-20 DIAGNOSIS — T43622A Poisoning by amphetamines, intentional self-harm, initial encounter: Secondary | ICD-10-CM | POA: Diagnosis present

## 2017-05-20 DIAGNOSIS — F1920 Other psychoactive substance dependence, uncomplicated: Secondary | ICD-10-CM

## 2017-05-20 DIAGNOSIS — Z79899 Other long term (current) drug therapy: Secondary | ICD-10-CM

## 2017-05-20 DIAGNOSIS — J9602 Acute respiratory failure with hypercapnia: Secondary | ICD-10-CM | POA: Diagnosis present

## 2017-05-20 DIAGNOSIS — Y908 Blood alcohol level of 240 mg/100 ml or more: Secondary | ICD-10-CM | POA: Diagnosis present

## 2017-05-20 DIAGNOSIS — T401X4A Poisoning by heroin, undetermined, initial encounter: Secondary | ICD-10-CM

## 2017-05-20 DIAGNOSIS — J9601 Acute respiratory failure with hypoxia: Secondary | ICD-10-CM | POA: Diagnosis present

## 2017-05-20 DIAGNOSIS — J690 Pneumonitis due to inhalation of food and vomit: Secondary | ICD-10-CM | POA: Diagnosis present

## 2017-05-20 DIAGNOSIS — E876 Hypokalemia: Secondary | ICD-10-CM | POA: Diagnosis present

## 2017-05-20 DIAGNOSIS — F10229 Alcohol dependence with intoxication, unspecified: Secondary | ICD-10-CM | POA: Diagnosis present

## 2017-05-20 DIAGNOSIS — D696 Thrombocytopenia, unspecified: Secondary | ICD-10-CM | POA: Diagnosis present

## 2017-05-20 DIAGNOSIS — D649 Anemia, unspecified: Secondary | ICD-10-CM | POA: Diagnosis present

## 2017-05-20 DIAGNOSIS — T43624A Poisoning by amphetamines, undetermined, initial encounter: Secondary | ICD-10-CM

## 2017-05-20 DIAGNOSIS — G9340 Encephalopathy, unspecified: Secondary | ICD-10-CM | POA: Diagnosis present

## 2017-05-20 LAB — CBC WITH DIFFERENTIAL/PLATELET
BASOS ABS: 0 10*3/uL (ref 0.0–0.1)
Basophils Relative: 0 %
EOS ABS: 0 10*3/uL (ref 0.0–0.7)
EOS PCT: 0 %
HCT: 39.4 % (ref 36.0–46.0)
Hemoglobin: 13.4 g/dL (ref 12.0–15.0)
LYMPHS PCT: 18 %
Lymphs Abs: 1.9 10*3/uL (ref 0.7–4.0)
MCH: 30.7 pg (ref 26.0–34.0)
MCHC: 34 g/dL (ref 30.0–36.0)
MCV: 90.2 fL (ref 78.0–100.0)
Monocytes Absolute: 0.3 10*3/uL (ref 0.1–1.0)
Monocytes Relative: 3 %
Neutro Abs: 8 10*3/uL — ABNORMAL HIGH (ref 1.7–7.7)
Neutrophils Relative %: 79 %
PLATELETS: 137 10*3/uL — AB (ref 150–400)
RBC: 4.37 MIL/uL (ref 3.87–5.11)
RDW: 13 % (ref 11.5–15.5)
WBC: 10.2 10*3/uL (ref 4.0–10.5)

## 2017-05-20 LAB — COMPREHENSIVE METABOLIC PANEL
ALK PHOS: 75 U/L (ref 38–126)
ALT: 215 U/L — AB (ref 14–54)
AST: 149 U/L — ABNORMAL HIGH (ref 15–41)
Albumin: 3.7 g/dL (ref 3.5–5.0)
Anion gap: 15 (ref 5–15)
BILIRUBIN TOTAL: 0.5 mg/dL (ref 0.3–1.2)
BUN: 10 mg/dL (ref 6–20)
CALCIUM: 7.9 mg/dL — AB (ref 8.9–10.3)
CO2: 22 mmol/L (ref 22–32)
CREATININE: 0.66 mg/dL (ref 0.44–1.00)
Chloride: 105 mmol/L (ref 101–111)
GFR calc Af Amer: >60 mL/min (ref >60)
Glucose, Bld: 123 mg/dL — ABNORMAL HIGH (ref 65–99)
Potassium: 4 mmol/L (ref 3.5–5.1)
Sodium: 142 mmol/L (ref 135–145)
Total Protein: 6.7 g/dL (ref 6.5–8.1)

## 2017-05-20 LAB — BLOOD GAS, ARTERIAL
ACID-BASE DEFICIT: 4.8 mmol/L — AB (ref 0.0–2.0)
Acid-base deficit: 7 mmol/L — ABNORMAL HIGH (ref 0.0–2.0)
BICARBONATE: 20.5 mmol/L (ref 20.0–28.0)
BICARBONATE: 20.9 mmol/L (ref 20.0–28.0)
DRAWN BY: 232811
Drawn by: 232811
FIO2: 100
FIO2: 40
LHR: 14 {breaths}/min
LHR: 16 {breaths}/min
MECHVT: 440 mL
MECHVT: 440 mL
O2 SAT: 90.2 %
O2 Saturation: 98.9 %
PATIENT TEMPERATURE: 36.1
PEEP/CPAP: 8 cmH2O
PEEP/CPAP: 5 cmH2O
PO2 ART: 316 mmHg — AB (ref 83.0–108.0)
PO2 ART: 70 mmHg — AB (ref 83.0–108.0)
Patient temperature: 37.7
pCO2 arterial: 49.2 mmHg — ABNORMAL HIGH (ref 32.0–48.0)
pCO2 arterial: 44.8 mmHg (ref 32.0–48.0)
pH, Arterial: 7.236 — ABNORMAL LOW (ref 7.350–7.450)
pH, Arterial: 7.295 — ABNORMAL LOW (ref 7.350–7.450)

## 2017-05-20 LAB — PROTIME-INR
INR: 0.93
Prothrombin Time: 12.4 seconds (ref 11.4–15.2)

## 2017-05-20 LAB — RAPID URINE DRUG SCREEN, HOSP PERFORMED
Amphetamines: POSITIVE — AB
BARBITURATES: POSITIVE — AB
BENZODIAZEPINES: POSITIVE — AB
Cocaine: POSITIVE — AB
Opiates: POSITIVE — AB
Tetrahydrocannabinol: POSITIVE — AB

## 2017-05-20 LAB — TRIGLYCERIDES: TRIGLYCERIDES: 91 mg/dL (ref <150)

## 2017-05-20 LAB — I-STAT BETA HCG BLOOD, ED (MC, WL, AP ONLY)

## 2017-05-20 LAB — CBG MONITORING, ED: Glucose-Capillary: 121 mg/dL — ABNORMAL HIGH (ref 65–99)

## 2017-05-20 LAB — ACETAMINOPHEN LEVEL: Acetaminophen (Tylenol), Serum: <10 ug/mL — ABNORMAL LOW (ref 10–30)

## 2017-05-20 LAB — LACTIC ACID, PLASMA: LACTIC ACID, VENOUS: 2.1 mmol/L — AB (ref 0.5–1.9)

## 2017-05-20 LAB — SALICYLATE LEVEL: Salicylate Lvl: <7 mg/dL (ref 2.8–30.0)

## 2017-05-20 LAB — MRSA PCR SCREENING: MRSA by PCR: POSITIVE — AB

## 2017-05-20 LAB — ETHANOL: Alcohol, Ethyl (B): 258 mg/dL — ABNORMAL HIGH (ref <10)

## 2017-05-20 LAB — PROCALCITONIN: Procalcitonin: 5.36 ng/mL

## 2017-05-20 MED ORDER — SODIUM CHLORIDE 0.9 % IV SOLN
INTRAVENOUS | Status: DC
  Administered 2017-05-20 (×2): via INTRAVENOUS

## 2017-05-20 MED ORDER — FOLIC ACID 5 MG/ML IJ SOLN
1.0000 mg | Freq: Every day | INTRAMUSCULAR | Status: DC
  Administered 2017-05-20 – 2017-05-21 (×2): 1 mg via INTRAVENOUS
  Filled 2017-05-20 (×3): qty 0.2

## 2017-05-20 MED ORDER — FENTANYL CITRATE (PF) 100 MCG/2ML IJ SOLN: 100.0000 ug | INTRAMUSCULAR | Status: DC | PRN

## 2017-05-20 MED ORDER — PROPOFOL 1000 MG/100ML IV EMUL
0.0000 ug/kg/min | INTRAVENOUS | Status: DC
  Administered 2017-05-20: 5 ug/kg/min via INTRAVENOUS
  Filled 2017-05-20: qty 100

## 2017-05-20 MED ORDER — ONDANSETRON HCL 4 MG/2ML IJ SOLN
4.0000 mg | Freq: Four times a day (QID) | INTRAMUSCULAR | Status: DC | PRN
  Administered 2017-05-21: 4 mg via INTRAVENOUS
  Filled 2017-05-20: qty 2

## 2017-05-20 MED ORDER — HEPARIN SODIUM (PORCINE) 5000 UNIT/ML IJ SOLN
5000.0000 [IU] | Freq: Three times a day (TID) | INTRAMUSCULAR | Status: DC
  Administered 2017-05-20 – 2017-05-22 (×7): 5000 [IU] via SUBCUTANEOUS
  Filled 2017-05-20 (×8): qty 1

## 2017-05-20 MED ORDER — DOCUSATE SODIUM 50 MG/5ML PO LIQD: 100.0000 mg | Freq: Two times a day (BID) | ORAL | Status: DC | PRN

## 2017-05-20 MED ORDER — SODIUM CHLORIDE 0.9 % IV SOLN: 250.0000 mL | INTRAVENOUS | Status: DC | PRN

## 2017-05-20 MED ORDER — CHLORHEXIDINE GLUCONATE CLOTH 2 % EX PADS
6.0000 | MEDICATED_PAD | Freq: Every day | CUTANEOUS | Status: AC
  Administered 2017-05-20 – 2017-05-25 (×4): 6 via TOPICAL

## 2017-05-20 MED ORDER — SODIUM CHLORIDE 0.9 % IV BOLUS (SEPSIS)
1000.0000 mL | Freq: Once | INTRAVENOUS | Status: AC
  Administered 2017-05-20: 1000 mL via INTRAVENOUS

## 2017-05-20 MED ORDER — SODIUM CHLORIDE 0.9 % IV SOLN
0.0000 ug/h | INTRAVENOUS | Status: DC
  Administered 2017-05-20: 25 ug/h via INTRAVENOUS
  Administered 2017-05-21: 125 ug/h via INTRAVENOUS
  Filled 2017-05-20 (×2): qty 50

## 2017-05-20 MED ORDER — SODIUM CHLORIDE 0.9 % IV SOLN
3.0000 g | Freq: Four times a day (QID) | INTRAVENOUS | Status: DC
  Administered 2017-05-20 – 2017-05-24 (×15): 3 g via INTRAVENOUS
  Filled 2017-05-20 (×16): qty 3

## 2017-05-20 MED ORDER — MIDAZOLAM HCL 2 MG/2ML IJ SOLN: 1.0000 mg | INTRAMUSCULAR | Status: DC | PRN

## 2017-05-20 MED ORDER — RANITIDINE HCL 150 MG/10ML PO SYRP
150.0000 mg | ORAL_SOLUTION | Freq: Two times a day (BID) | ORAL | Status: DC
  Administered 2017-05-20 (×2): 150 mg
  Filled 2017-05-20 (×3): qty 10

## 2017-05-20 MED ORDER — MIDAZOLAM HCL 2 MG/2ML IJ SOLN: 2.0000 mg | INTRAMUSCULAR | Status: DC | PRN

## 2017-05-20 MED ORDER — SODIUM CHLORIDE 0.9 % IV SOLN
INTRAVENOUS | Status: DC
  Administered 2017-05-20 (×2): via INTRAVENOUS

## 2017-05-20 MED ORDER — LACTATED RINGERS IV BOLUS (SEPSIS)
750.0000 mL | Freq: Once | INTRAVENOUS | Status: AC
  Administered 2017-05-20: 750 mL via INTRAVENOUS

## 2017-05-20 MED ORDER — ACETAMINOPHEN 325 MG PO TABS: 650.0000 mg | ORAL_TABLET | ORAL | Status: DC | PRN

## 2017-05-20 MED ORDER — MUPIROCIN 2 % EX OINT
1.0000 "application " | TOPICAL_OINTMENT | Freq: Two times a day (BID) | CUTANEOUS | Status: AC
  Administered 2017-05-20 – 2017-05-25 (×10): 1 via NASAL
  Filled 2017-05-20 (×2): qty 22

## 2017-05-20 MED ORDER — PROPOFOL 1000 MG/100ML IV EMUL
0.0000 ug/kg/min | INTRAVENOUS | Status: DC
  Administered 2017-05-20: 25 ug/kg/min via INTRAVENOUS
  Administered 2017-05-20: 40 ug/kg/min via INTRAVENOUS
  Filled 2017-05-20 (×3): qty 100

## 2017-05-20 MED ORDER — FAMOTIDINE 40 MG/5ML PO SUSR: 20.0000 mg | Freq: Two times a day (BID) | ORAL | Status: DC

## 2017-05-20 MED ORDER — LACTATED RINGERS IV SOLN
INTRAVENOUS | Status: DC
  Administered 2017-05-20 – 2017-05-25 (×9): via INTRAVENOUS

## 2017-05-20 MED ORDER — ACETAMINOPHEN 160 MG/5ML PO SOLN
650.0000 mg | ORAL | Status: DC | PRN
  Administered 2017-05-20 – 2017-05-22 (×3): 650 mg
  Filled 2017-05-20 (×3): qty 20.3

## 2017-05-20 MED ORDER — ORAL CARE MOUTH RINSE
15.0000 mL | Freq: Four times a day (QID) | OROMUCOSAL | Status: DC
  Administered 2017-05-20 – 2017-05-26 (×9): 15 mL via OROMUCOSAL

## 2017-05-20 MED ORDER — ADULT MULTIVITAMIN LIQUID CH
15.0000 mL | Freq: Every day | ORAL | Status: DC
  Administered 2017-05-20: 15 mL via ORAL
  Filled 2017-05-20 (×2): qty 15

## 2017-05-20 MED ORDER — CHLORHEXIDINE GLUCONATE 0.12% ORAL RINSE (MEDLINE KIT)
15.0000 mL | Freq: Two times a day (BID) | OROMUCOSAL | Status: DC
  Administered 2017-05-20 – 2017-05-27 (×6): 15 mL via OROMUCOSAL

## 2017-05-20 MED ORDER — THIAMINE HCL 100 MG/ML IJ SOLN
100.0000 mg | Freq: Every day | INTRAMUSCULAR | Status: DC
  Administered 2017-05-20 – 2017-05-21 (×2): 100 mg via INTRAVENOUS
  Filled 2017-05-20 (×2): qty 2

## 2017-05-20 MED ORDER — DEXTROSE 5 % IN LACTATED RINGERS IV BOLUS
1000.0000 mL | Freq: Once | INTRAVENOUS | Status: AC
  Administered 2017-05-20: 1000 mL via INTRAVENOUS

## 2017-05-20 NOTE — ED Notes (Signed)
MD notified regarding pt BP and temperature. Ordered to give tylenol per OG tube.

## 2017-05-20 NOTE — Progress Notes (Signed)
[facility] afternoon follow-up      Interval/events   Sedated on propofol gtt BP boarderline, nursing sees some variation w/ titration of sedating gtts BUT she is tachycardic AND has fever. There was also concern about aspiration.    Vitals/hemodynamics/gtts   BP 93/67   Pulse (!) 105   Temp 99.1 F (37.3 C)   Resp 17   Ht 5\' 4"  (1.626 m)   Wt 152 lb (68.9 kg)   LMP  (LMP Unknown)   SpO2 93%   BMI 26.09 kg/m    . sodium chloride 125 mL/hr at 05/20/17 0436  . sodium chloride    . sodium chloride Stopped (05/20/17 1225)  . fentaNYL infusion INTRAVENOUS 100 mcg/hr (05/20/17 1159)  . propofol (DIPRIVAN) infusion 25 mcg/kg/min (05/20/17 0932)    Intake/Output Summary (Last 24 hours) at 05/20/2017 1453 Last data filed at 05/20/2017 0338 Gross per 24 hour  Intake 1000 ml  Output -  Net 1000 ml    Physical exam   General 32 yowf, sedated on vent. Will open eyes. Follow commands. Not in distress on vent HENT: NCAT, no JVD.orally intubated Pulm: clear w/ equal chest rise Card: Tachy rrr  Abd: soft not tender + bowel sounds Neuro/psych: sedated.  Extremities/MS: warm and dry   Recent Labs  Lab 05/20/17 0241  NA 142  K 4.0  CL 105  CO2 22  BUN 10  CREATININE 0.66  GLUCOSE 123*   Recent Labs  Lab 05/20/17 0241  HGB 13.4  HCT 39.4  WBC 10.2  PLT 137*   ABG    Component Value Date/Time   PHART 7.295 (L) 05/20/2017 0628   PCO2ART 44.8 05/20/2017 0628   PO2ART 70.0 (L) 05/20/2017 0628   HCO3 20.9 05/20/2017 0628   TCO2 29 06/12/2016 1555   ACIDBASEDEF 4.8 (H) 05/20/2017 0628   O2SAT 90.2 05/20/2017 0628   Recent Labs  Lab 05/20/17 0241 05/20/17 1248  PROCALCITON  --  5.36  WBC 10.2  --   LATICACIDVEN  --  2.1*    Impression/plan    acute hypoxic respiratory failure in setting of Heroine overdose and aspiration event  -PCXR: no clear infiltrates  -spiking fever -PCT elevated  -still requiring high FIO2 Plan Peep adjusted.   Cont full vent support Sputum sent  Add unasyn  Repeat abg and cxr in am   SIRS/sepsis. Septic shock w/ mildly elevated LA Presume that this is 2/2 evolving aspiration event.  -I have already administered 750 ml bolus  Plan will complete 70ml/kg challenge Repeat LA  abx as above  Acute encephalopathy -improving Plan PAD protocol     Erick Colace ACNP-BC [place] Pager # [phone] OR # [phone] if no answer

## 2017-05-20 NOTE — Progress Notes (Signed)
Patient transported to ICU from ED on 100% FiO2 and full support. No complications noted. RT will continue to monitor patient.

## 2017-05-20 NOTE — ED Notes (Signed)
Patient's boyfriend Marcello Moores [phone] wanted update

## 2017-05-20 NOTE — ED Notes (Signed)
Bed: RESB Expected date:  Expected time:  Means of arrival:  Comments: EMS 32 yo female heroin overdose narcan CO2 24 100/50 HR 120 unresponsive

## 2017-05-20 NOTE — H&P (Signed)
PULMONARY / CRITICAL CARE MEDICINE   Name: Sylvia Maxwell MRN: [id number] DOB: Dec 31, 1985    ADMISSION DATE:  05/20/2017 CONSULTATION DATE: 05/20/17  REFERRING MD: ER MD  CHIEF COMPLAINT: Heroin overdose  HISTORY OF PRESENT ILLNESS:   60yoF with hx Etoh abuse, presents to the ER this evening after a heroin overdose. Patient presented to the ER via EMS with boyfriend who admitted patient had taken heroin and meth earlier today. Patient received narcan x 2 en route without response. She was intubated on ER arrival. No family at bedside during my exam to give more history.   PAST MEDICAL HISTORY :  She  has a past medical history of Alcoholism ([place]).  PAST SURGICAL HISTORY: She  has a past surgical history that includes Breast enhancement surgery (2008).  Allergies  Allergen Reactions  . Lactose Intolerance (Gi) Diarrhea and Nausea And Vomiting    Depends on the type of dairy-based product consumed as to which reaction occurs    No current facility-administered medications on file prior to encounter.    Current Outpatient Medications on File Prior to Encounter  Medication Sig  . cephALEXin (KEFLEX) 500 MG capsule Take 1 capsule (500 mg total) 3 (three) times daily by mouth.  . gabapentin (NEURONTIN) 300 MG capsule Take 1 capsule (300 mg total) by mouth 3 (three) times daily.   FAMILY HISTORY:  Her has no family status information on file.   SOCIAL HISTORY: She  reports that she has been smoking cigarettes.  she has never used smokeless tobacco. She reports that she drinks alcohol. She reports that she uses drugs.  REVIEW OF SYSTEMS:   Review of Systems  Unable to perform ROS: Critical illness   SUBJECTIVE:  Intubated and sedated   VITAL SIGNS: BP 104/74   Pulse 90   Temp (!) 96.8 F (36 C)   Resp (!) 23   Ht 5\' 4"  (1.626 m)   Wt 68.9 kg (152 lb)   LMP  (LMP Unknown)   SpO2 100%   BMI 26.09 kg/m   VENTILATOR SETTINGS: Vent Mode: PRVC FiO2 (%):  [40  %-100 %] 40 % Set Rate:  [14 bmp-16 bmp] 16 bmp Vt Set:  [440 mL] 440 mL PEEP:  [5 cmH20-8 cmH20] 5 cmH20 Plateau Pressure:  [15 cmH20-26 cmH20] 15 cmH20  INTAKE / OUTPUT: No intake/output data recorded.  PHYSICAL EXAMINATION: General: WDWN Adult female, intubated and sedated, critically ill. Vomit present in her hair Neuro: PERRL, no response to voice but coughs and attempts to sit up in bed in response to abdomen palpation. Coughs in response to ETT suctioning. Moving all extremities. Not obeying commands  HEENT: Orally intubated, OP clear, MM moist  Cardiovascular: RRR no m/r/g Lungs: CTA b/l Abdomen: Soft NTND, BS normoactive Musculoskeletal: no LE edema GU: foley in place Skin: tattoos  LABS:  BMET Recent Labs  Lab 05/20/17 0241  NA 142  K 4.0  CL 105  CO2 22  BUN 10  CREATININE 0.66  GLUCOSE 123*   Electrolytes Recent Labs  Lab 05/20/17 0241  CALCIUM 7.9*   CBC Recent Labs  Lab 05/20/17 0241  WBC 10.2  HGB 13.4  HCT 39.4  PLT 137*   Coag's No results for input(s): APTT, INR in the last 168 hours.  Sepsis Markers No results for input(s): LATICACIDVEN, PROCALCITON, O2SATVEN in the last 168 hours.  ABG Recent Labs  Lab 05/20/17 0310  PHART 7.236*  PCO2ART 49.2*  PO2ART 316*   Liver Enzymes Recent  Labs  Lab 05/20/17 0241  AST 149*  ALT 215*  ALKPHOS 75  BILITOT 0.5  ALBUMIN 3.7   Cardiac Enzymes No results for input(s): TROPONINI, PROBNP in the last 168 hours.  Glucose Recent Labs  Lab 05/20/17 0245  GLUCAP 121*   Imaging Dg Abdomen 1 View  Result Date: 05/20/2017 CLINICAL DATA:  Initial evaluation for OG tube placement EXAM: ABDOMEN - 1 VIEW COMPARISON:  None. FINDINGS: Enteric tube in place with tip in side hole overlying the partially visualized stomach, beyond the GE junction. Tip projects inferiorly. Visualized bowel gas pattern within normal limits. IMPRESSION: Tip and side hole of enteric tube overlying the stomach, well  beyond the GE junction. Electronically Signed   By: Jeannine Boga M.D.   On: 05/20/2017 02:48   Dg Chest Port 1 View  Result Date: 05/20/2017 CLINICAL DATA:  Initial evaluation status post intubation. EXAM: PORTABLE CHEST 1 VIEW COMPARISON:  None. FINDINGS: Endotracheal tube in place with tip positioned 18 mm above the carina. Enteric tube courses in the abdomen. Cardiac and mediastinal silhouettes within normal limits. Lungs hypoinflated. Secondary bilateral bronchovascular crowding with perihilar congestion. No focal infiltrates. No pulmonary edema or pleural effusion. No pneumothorax. Osseous structures within normal limits. IMPRESSION: 1. Tip of the endotracheal tube well positioned 18 mm above the carina. 2. Shallow lung inflation with secondary perihilar vascular congestion. Electronically Signed   By: Jeannine Boga M.D.   On: 05/20/2017 02:48   SIGNIFICANT EVENTS: 2/7: presented to ER via EMS after heroin overdose >> intubated in ER  LINES/TUBES: ETT 2/7 >> Foley catheter 2/7 >> OG tube 2/7 >> PIV's  DISCUSSION: 32yoF with hx Etoh abuse, presents to the ER this evening after a heroin overdose  ASSESSMENT / PLAN:  PULMONARY 1. Acute hypoxic and acute hypercapneic respiratory failure; Aspiration: - ABG showed acute respiratory acidosis; RR was increased on vent. Repeat ABG in AM - continue mechanical ventilation; repeat CXR daily - CXR on my review showed mild pulmonary edema (likely from the heroin) but no discrete infiltrates; ETT in good position.   CARDIOVASCULAR No active issues   RENAL No active issues   GASTROINTESTINAL No active issues. NPO. GI prophylaxis  HEMATOLOGIC No active issues. Heparin for DVT prophylaxis  INFECTIOUS 1. Aspiration event: emesis present in patient's hair and am able to suction small amount thick secretions from ETT, so it is likely that she aspirated at some point prior to intubation. However, the lack of fever or leukocytosis  and the lack of infiltrate on CXR seem to indicate no actual pneumonia at this time. Therefore will hold off on antibiotics.   ENDOCRINE No active issues   NEUROLOGIC 1. Heroin and Methamphetamine overdose: - supportive care - will need drug treatment rehab following discharge  2. Hx Etoh abuse; Etoh intoxication; Etoh hepatitis: - CIWA - Thiamine, Folate, MVI - Etoh level 258 in ER - mild hepatocellular pattern of transaminitis, likely due to alcoholic hepatitis. Will check coags so that we can calculate her maddrey score. Repeat LFT's in AM  FAMILY  - Updates: no family present in ER at time of my exam - Inter-disciplinary family meet or Palliative Care meeting due by: 05/27/17  60 minutes critical care time  Vernie Murders, MD  Pulmonary and [place] Pager: [phone]  05/20/2017, 3:53 AM

## 2017-05-20 NOTE — ED Provider Notes (Signed)
[facility] DEPT Provider Note   CSN: [id number] Arrival date & time: 05/20/17  0201     History   Chief Complaint Chief Complaint  Patient presents with  . Drug Overdose    HPI Sylvia Maxwell is a 32 y.o. female.  HPI  32 year old female comes in with chief complaint of overdose. Level 5 caveat for altered mental status.  According to EMS, they were requested to help patient out at a hotel where she was with her boyfriend.  Patient was apneic.  Boyfriend admitted to heroin and methamphetamine use.  Per EMS patient was cyanotic, they gave her Narcan 2 mg into 2 with slight response.  Patient did have large amount of emesis.  Patient arrives to the ER covered in emesis, and requiring supplemental oxygenation.  Patient was also noted to be hypercapnic, with O2 sats in the 60s and 70s.   Past Medical History:  Diagnosis Date  . Alcoholism [facility])     Patient Active Problem List   Diagnosis Date Noted  . Heroin overdose ([place]) 05/20/2017  . Alcohol dependence with withdrawal with complication ([place]) 56/43/3295  . Alcohol withdrawal seizure ([place]) 06/13/2016  . Seizure ([place])   . ETOH abuse 06/12/2016  . Alcohol withdrawal seizure without complication ([place]) 18/84/1660    Past Surgical History:  Procedure Laterality Date  . BREAST ENHANCEMENT SURGERY  2008    OB History    No data available       Home Medications    Prior to Admission medications   Medication Sig Start Date End Date Taking? Authorizing Provider  cephALEXin (KEFLEX) 500 MG capsule Take 1 capsule (500 mg total) 3 (three) times daily by mouth. 02/23/17   Tanna Furry, MD  gabapentin (NEURONTIN) 300 MG capsule Take 1 capsule (300 mg total) by mouth 3 (three) times daily. 01/31/17   Patrecia Pour, NP    Family History No family history on file.  Social History Social History   Tobacco Use  . Smoking status: Current Some Day Smoker    Types: Cigarettes  .  Smokeless tobacco: Never Used  Substance Use Topics  . Alcohol use: Yes    Comment: 1 bottle of wine per day and "shots"  . Drug use: Yes    Comment: Meth, heroin      Allergies   Lactose intolerance (gi)   Review of Systems Review of Systems  Unable to perform ROS: Mental status change     Physical Exam Updated Vital Signs BP 102/72   Pulse 92   Temp (!) 97 F (36.1 C)   Resp (!) 21   Ht _0  (1.626 m)   Wt 68.9 kg (152 lb)   LMP  (LMP Unknown)   SpO2 100%   BMI 26.09 kg/m   Physical Exam  Constitutional: She appears well-developed.  HENT:  Minimal gag reflex  Eyes:  4 mm and equal  Neck: Normal range of motion. Neck supple.  Cardiovascular: Normal rate.  Pulmonary/Chest: Effort normal.  Abdominal: Soft.  Neurological:  Not following any commands, responding to noxious stimuli only  Skin: Skin is warm and dry.  Nursing note and vitals reviewed.    ED Treatments / Results  Labs (all labs ordered are listed, but only abnormal results are displayed) Labs Reviewed  COMPREHENSIVE METABOLIC PANEL - Abnormal; Notable for the following components:      Result Value   Glucose, Bld 123 (*)    Calcium 7.9 (*)  AST 149 (*)    ALT 215 (*)    All other components within normal limits  ACETAMINOPHEN LEVEL - Abnormal; Notable for the following components:   Acetaminophen (Tylenol), Serum <10 (*)    All other components within normal limits  ETHANOL - Abnormal; Notable for the following components:   Alcohol, Ethyl (B) 258 (*)    All other components within normal limits  RAPID URINE DRUG SCREEN, HOSP PERFORMED - Abnormal; Notable for the following components:   Opiates POSITIVE (*)    Cocaine POSITIVE (*)    Benzodiazepines POSITIVE (*)    Amphetamines POSITIVE (*)    Tetrahydrocannabinol POSITIVE (*)    Barbiturates POSITIVE (*)    All other components within normal limits  CBC WITH DIFFERENTIAL/PLATELET - Abnormal; Notable for the following components:    Platelets 137 (*)    Neutro Abs 8.0 (*)    All other components within normal limits  BLOOD GAS, ARTERIAL - Abnormal; Notable for the following components:   pH, Arterial 7.236 (*)    pCO2 arterial 49.2 (*)    pO2, Arterial 316 (*)    Acid-base deficit 7.0 (*)    All other components within normal limits  CBG MONITORING, ED - Abnormal; Notable for the following components:   Glucose-Capillary 121 (*)    All other components within normal limits  SALICYLATE LEVEL  TRIGLYCERIDES  PROTIME-INR  I-STAT BETA HCG BLOOD, ED ([facility], [facility], [facility] ONLY)    EKG  EKG Interpretation  Date/Time:  Thursday May 20 2017 02:39:19 EST Ventricular Rate:  115 PR Interval:    QRS Duration: 81 QT Interval:  336 QTC Calculation: 465 R Axis:   77 Text Interpretation:  Sinus tachycardia No acute changes Nonspecific ST and T wave abnormality Confirmed by Varney Biles ([id number]) on 05/20/2017 4:47:23 AM       Radiology Dg Abdomen 1 View  Result Date: 05/20/2017 CLINICAL DATA:  Initial evaluation for OG tube placement EXAM: ABDOMEN - 1 VIEW COMPARISON:  None. FINDINGS: Enteric tube in place with tip in side hole overlying the partially visualized stomach, beyond the GE junction. Tip projects inferiorly. Visualized bowel gas pattern within normal limits. IMPRESSION: Tip and side hole of enteric tube overlying the stomach, well beyond the GE junction. Electronically Signed   By: Jeannine Boga M.D.   On: 05/20/2017 02:48   Dg Chest Port 1 View  Result Date: 05/20/2017 CLINICAL DATA:  Initial evaluation status post intubation. EXAM: PORTABLE CHEST 1 VIEW COMPARISON:  None. FINDINGS: Endotracheal tube in place with tip positioned 18 mm above the carina. Enteric tube courses in the abdomen. Cardiac and mediastinal silhouettes within normal limits. Lungs hypoinflated. Secondary bilateral bronchovascular crowding with perihilar congestion. No focal infiltrates. No pulmonary edema or pleural effusion. No  pneumothorax. Osseous structures within normal limits. IMPRESSION: 1. Tip of the endotracheal tube well positioned 18 mm above the carina. 2. Shallow lung inflation with secondary perihilar vascular congestion. Electronically Signed   By: Jeannine Boga M.D.   On: 05/20/2017 02:48    Procedures Procedure Name: Intubation Date/Time: 05/20/2017 4:47 AM Performed by: Varney Biles, MD Pre-anesthesia Checklist: Patient identified, Patient being monitored, Emergency Drugs available, Timeout performed and Suction available Oxygen Delivery Method: Non-rebreather mask Preoxygenation: Pre-oxygenation with 100% oxygen Induction Type: Rapid sequence Ventilation: Mask ventilation without difficulty Laryngoscope Size: Glidescope and 3 Tube size: 7.5 mm Airway Equipment and Method: Rigid stylet and Video-laryngoscopy Placement Confirmation: ETT inserted through vocal cords under direct vision,  CO2 detector and  Breath sounds checked- equal and bilateral Secured at: 23 cm Tube secured with: ETT holder      CRITICAL CARE Performed by: Brinton Brandel   Total critical care time: 38 minutes  Critical care time was exclusive of separately billable procedures and treating other patients.  Critical care was necessary to treat or prevent imminent or life-threatening deterioration.  Critical care was time spent personally by me on the following activities: development of treatment plan with patient and/or surrogate as well as nursing, discussions with consultants, evaluation of patient's response to treatment, examination of patient, obtaining history from patient or surrogate, ordering and performing treatments and interventions, ordering and review of laboratory studies, ordering and review of radiographic studies, pulse oximetry and re-evaluation of patient's condition.    (including critical care time)    Medications Ordered in ED Medications  sodium chloride 0.9 % bolus 1,000 mL (0 mLs  Intravenous Stopped 05/20/17 0338)    And  0.9 %  sodium chloride infusion ( Intravenous New Bag/Given 05/20/17 0436)  0.9 %  sodium chloride infusion (not administered)  heparin injection 5,000 Units (not administered)  famotidine (PEPCID) 40 MG/5ML suspension 20 mg (not administered)  0.9 %  sodium chloride infusion ( Intravenous New Bag/Given 05/20/17 0436)  acetaminophen (TYLENOL) tablet 650 mg (not administered)  ondansetron (ZOFRAN) injection 4 mg (not administered)  chlorhexidine gluconate (MEDLINE KIT) (PERIDEX) 0.12 % solution 15 mL (not administered)  MEDLINE mouth rinse (15 mLs Mouth Rinse Given 05/20/17 0434)  fentaNYL (SUBLIMAZE) injection 100 mcg (not administered)  propofol (DIPRIVAN) 1000 MG/100ML infusion (40 mcg/kg/min  68.9 kg Intravenous New Bag/Given 05/20/17 0435)  midazolam (VERSED) injection 2 mg (not administered)  docusate (COLACE) 50 MG/5ML liquid 100 mg (not administered)  fentaNYL (SUBLIMAZE) 2,500 mcg in sodium chloride 0.9 % 250 mL (10 mcg/mL) infusion (25 mcg/hr Intravenous New Bag/Given 05/20/17 0433)  thiamine (B-1) injection 100 mg (not administered)  folic acid injection 1 mg (not administered)  multivitamin liquid 15 mL (not administered)  midazolam (VERSED) injection 1-2 mg (not administered)     Initial Impression / Assessment and Plan / ED Course  I have reviewed the triage vital signs and the nursing notes.  Pertinent labs & imaging results that were available during my care of the patient were reviewed by me and considered in my medical decision making (see chart for details).     32 year old female with history of polysubstance abuse comes in with chief complaint of altered mental status.  Patient allegedly was apneic per EMS when they arrived, with boyfriend admitting that there was heroin use.  Patient had received 4 mg of Narcan prior to ER arrival and had emesis.  Patient was noted to be hypoxic with respiratory rate in the teens.  Patient was given 2  more IV Narcan with minimal response.  All the patient's respiratory rate improved to 20s, she continued to stay hypoxic despite being on nonrebreather.  I suspect that patient's hypoxia is due to aspiration.  Patient also was constantly coughing and vomiting in the ER, and she had a weak gag.  Decision was made to intubate patient mainly to control her airway.  CCM to admit.  Final Clinical Impressions(s) / ED Diagnoses   Final diagnoses:  Opiate overdose, accidental or unintentional, initial encounter ([place])  Acute respiratory failure with hypoxia ([place])  Aspiration pneumonia, unspecified aspiration pneumonia type, unspecified laterality, unspecified part of lung ([place])  Polysubstance abuse [facility])    ED Discharge Orders    None  Varney Biles, MD 05/20/17 [phone]

## 2017-05-20 NOTE — ED Triage Notes (Signed)
Pt arrived from home via [facility] with reports of heroin overdose, boyfriend also admitted they used meth earlier today. Pt received 2 mg narcan IM and 2 mg narcan IV en route.

## 2017-05-20 NOTE — ED Notes (Signed)
20mg  Etomidate given 0219 100 mg Succs given 0219 Tube placed 23 @ lip at 0220

## 2017-05-20 NOTE — Progress Notes (Signed)
Pharmacy Antibiotic Note  Sylvia Maxwell is a 32 y.o. female admitted on 05/20/2017 with aspiration pneumonia.  Pharmacy has been consulted for ampicillin/sulbactam dosing.  Plan:   Ampicillin/sulbactam 3gm IV q6h  Do not anticipate need for dose adjustment, pharmacy to sign-off  Height: 5\' 4"  (162.6 cm) Weight: 152 lb (68.9 kg) IBW/kg (Calculated) : 54.7  Temp (24hrs), Avg:99.7 F (37.6 C), Min:96.6 F (35.9 C), Max:101.8 F (38.8 C)  Recent Labs  Lab 05/20/17 0241 05/20/17 1248  WBC 10.2  --   CREATININE 0.66  --   LATICACIDVEN  --  2.1*    Estimated Creatinine Clearance: 94.5 mL/min (by C-G formula based on SCr of 0.66 mg/dL).    Allergies  Allergen Reactions  . Lactose Intolerance (Gi) Diarrhea and Nausea And Vomiting    Depends on the type of dairy-based product consumed as to which reaction occurs    Antimicrobials this admission: 2/7 amp/sulb >>  Dose adjustments this admission:  Microbiology results: 2/7 Sputum:    Thank you for allowing pharmacy to be a part of this patient's care.  Doreene Eland, PharmD, BCPS.   Pager: [phone] 05/20/2017 3:27 PM

## 2017-05-21 ENCOUNTER — Inpatient Hospital Stay (HOSPITAL_COMMUNITY): Payer: Self-pay

## 2017-05-21 LAB — COMPREHENSIVE METABOLIC PANEL
ALK PHOS: 51 U/L (ref 38–126)
ALT: 123 U/L — AB (ref 14–54)
AST: 65 U/L — ABNORMAL HIGH (ref 15–41)
Albumin: 2.7 g/dL — ABNORMAL LOW (ref 3.5–5.0)
Anion gap: 5 (ref 5–15)
BILIRUBIN TOTAL: 0.6 mg/dL (ref 0.3–1.2)
BUN: 6 mg/dL (ref 6–20)
CALCIUM: 7.2 mg/dL — AB (ref 8.9–10.3)
CO2: 28 mmol/L (ref 22–32)
CREATININE: 0.42 mg/dL — AB (ref 0.44–1.00)
Chloride: 104 mmol/L (ref 101–111)
GFR calc Af Amer: >60 mL/min (ref >60)
Glucose, Bld: 99 mg/dL (ref 65–99)
Potassium: 3.5 mmol/L (ref 3.5–5.1)
Sodium: 137 mmol/L (ref 135–145)
Total Protein: 5.2 g/dL — ABNORMAL LOW (ref 6.5–8.1)

## 2017-05-21 LAB — CBC
HCT: 34 % — ABNORMAL LOW (ref 36.0–46.0)
Hemoglobin: 11.4 g/dL — ABNORMAL LOW (ref 12.0–15.0)
MCH: 30.7 pg (ref 26.0–34.0)
MCHC: 33.5 g/dL (ref 30.0–36.0)
MCV: 91.6 fL (ref 78.0–100.0)
PLATELETS: 98 10*3/uL — AB (ref 150–400)
RBC: 3.71 MIL/uL — ABNORMAL LOW (ref 3.87–5.11)
RDW: 13.5 % (ref 11.5–15.5)
WBC: 10.7 10*3/uL — AB (ref 4.0–10.5)

## 2017-05-21 LAB — PROCALCITONIN: PROCALCITONIN: 4.88 ng/mL

## 2017-05-21 LAB — PHOSPHORUS: Phosphorus: 2.6 mg/dL (ref 2.5–4.6)

## 2017-05-21 LAB — MAGNESIUM: MAGNESIUM: 1.1 mg/dL — AB (ref 1.7–2.4)

## 2017-05-21 MED ORDER — IPRATROPIUM-ALBUTEROL 0.5-2.5 (3) MG/3ML IN SOLN
3.0000 mL | Freq: Four times a day (QID) | RESPIRATORY_TRACT | Status: DC | PRN
  Administered 2017-05-21: 3 mL via RESPIRATORY_TRACT

## 2017-05-21 MED ORDER — POTASSIUM CHLORIDE 20 MEQ/15ML (10%) PO SOLN
20.0000 meq | ORAL | Status: AC
  Administered 2017-05-21 (×2): 20 meq
  Filled 2017-05-21 (×2): qty 15

## 2017-05-21 MED ORDER — IPRATROPIUM-ALBUTEROL 0.5-2.5 (3) MG/3ML IN SOLN
RESPIRATORY_TRACT | Status: AC
  Administered 2017-05-21: 3 mL via RESPIRATORY_TRACT
  Filled 2017-05-21: qty 3

## 2017-05-21 NOTE — Progress Notes (Signed)
[facility]     30yoF with hx Etoh abuse, presents to the ER on 2/7 s/p heroin overdose. Patient presented to the ER via EMS with boyfriend who admitted patient had taken heroin and meth earlier today. Patient received narcan x 2 en route without response. She was intubated on ER arrival.    Interval/events   Required several fluid challenges on day of admit. Started on unasyn 2/7 given concern for aspiration.  Clinically looking much improved.  Hemodynamically stable overnight excellent Vt on SBT. Fully awake and interactive  Vitals/hemodynamics/gtts   BP 101/85   Pulse (!) 109   Temp 99.9 F (37.7 C)   Resp 19   Ht 5\' 4"  (1.626 m)   Wt 171 lb 1.2 oz (77.6 kg)   LMP  (LMP Unknown)   SpO2 93%   BMI 29.37 kg/m    . sodium chloride    . ampicillin-sulbactam (UNASYN) IV Stopped (05/21/17 0600)  . lactated ringers 125 mL/hr at 05/20/17 1617  . propofol (DIPRIVAN) infusion 25 mcg/kg/min (05/20/17 1618)    Intake/Output Summary (Last 24 hours) at 05/21/2017 0858 Last data filed at 05/21/2017 0600 Gross per 24 hour  Intake 1557.59 ml  Output 1350 ml  Net 207.59 ml    Physical exam   General: 32 year old white female resting comfortably on the ventilator. HEENT: Normocephalic atraumatic no jugular venous distention endotracheal tube in place Pulmonary: Some rhonchi that clear with cough excellent tidal volume on spontaneous breathing trial no accessory use no distress Cardiac: Mild tachycardia otherwise regular rate and rhythm no murmur rub or gallop Abdomen: Soft nontender no organomegaly Extremities/muscular skeletal: Brisk cap refill some trace edema today, strong pulses warm Neuro/psych: Awake oriented, calm, following commands  Recent Labs  Lab 05/20/17 0241 05/21/17 0257  NA 142 137  K 4.0 3.5  CL 105 104  CO2 22 28  BUN 10 6  CREATININE 0.66 0.42*  GLUCOSE 123* 99   Recent Labs  Lab 05/20/17 0241 05/21/17 0257  HGB 13.4 11.4*  HCT 39.4  34.0*  WBC 10.2 10.7*  PLT 137* 98*   ABG    Component Value Date/Time   PHART 7.295 (L) 05/20/2017 0628   PCO2ART 44.8 05/20/2017 0628   PO2ART 70.0 (L) 05/20/2017 0628   HCO3 20.9 05/20/2017 0628   TCO2 29 06/12/2016 1555   ACIDBASEDEF 4.8 (H) 05/20/2017 0628   O2SAT 90.2 05/20/2017 0628   Recent Labs  Lab 05/20/17 0241 05/20/17 1248 05/21/17 0257  PROCALCITON  --  5.36 4.88  WBC 10.2  --  10.7*  LATICACIDVEN  --  2.1*  --     Impression/plan  Resolved issues: Septic shock, lactic acidosis    acute hypoxic respiratory failure in setting of Heroine overdose and aspiration event  -PCXR: Personally reviewed, demonstrates evolution of somewhat diffuse bilateral infiltrates consistent with concern for aspiration event -Passed spontaneous breathing trial -Procalcitonin falling Plan Extubate Wean oxygen Incentive spirometry Day #2 Unasyn Follow-up culture data Repeat chest x-ray, may consider diuretics if remains hemodynamically stable  Acute encephalopathy; following heroin overdose (unintentional) -improving Plan DC sedation Supportive care  Anemia.  No evidence of bleeding.  Suspect some degree of hemodilution Plan Trend CBC Subcu heparin  DVT prophylaxis: [place] heparin SUP: stopped  Diet: advance  Activity: OOB Disposition : [place] ACNP-BC [place] Pager # [phone] OR # [phone] if no answer

## 2017-05-21 NOTE — Progress Notes (Signed)
[facility] ADULT ICU REPLACEMENT PROTOCOL FOR AM LAB REPLACEMENT ONLY  The patient does apply for the [facility] Adult ICU Electrolyte Replacment Protocol based on the criteria listed below:   1. Is GFR >/= 40 ml/min? Yes.    Patient's GFR today is >60 2. Is urine output >/= 0.5 ml/kg/hr for the last 6 hours? Yes.   Patient's UOP is 1.54 ml/kg/hr 3. Is BUN < 60 mg/dL? Yes.    Patient's BUN today is 6 4. Abnormal electrolyte(s): Potassium 3.5 5. Ordered repletion with: Potassium per Protocol 6. If a panic level lab has been reported, has the CCM MD in charge been notified? No..   Physician:    Adam Phenix 05/21/2017 4:39 AM

## 2017-05-21 NOTE — Procedures (Signed)
Extubation Procedure Note  Patient Details:   Name: Malayshia All DOB: 25-Jan-1986 MRN: [id number]   Airway Documentation:     Evaluation  O2 sats: stable throughout Complications: No apparent complications Patient did tolerate procedure well. Bilateral Breath Sounds: Diminished, Expiratory wheezes(slight coarse)  Pt placed on 4 lpm [place] Extubated @ 0915   Yes  Mika Griffitts L 05/21/2017, 10:06 AM

## 2017-05-21 NOTE — Progress Notes (Signed)
210 cc of fentanyl wasted in sink. Polly Cobia, RN witness to waste.

## 2017-05-21 NOTE — Progress Notes (Signed)
NUTRITION NOTE  Patient screened for new vent. Pt was extubated a few minutes ago and did not screen for any other reason for RD assessment. Should nutrition-related needs arise during hospitalization, please consult RD.    Jarome Matin, MS, RD, LDN, [facility] Inpatient Clinical Dietitian Pager # [phone] After hours/weekend pager # [phone]

## 2017-05-21 NOTE — Care Management Note (Signed)
Case Management Note  Patient Details  Name: Eleni Frank MRN: [id number] Date of Birth: 1985/12/04  Subjective/Objective:                  Overdose   Action/Plan: Date: May 21, 2017 Velva Harman, BSN, [place], [place] [phone] Chart and notes review for patient progress and needs. Will follow for case management and discharge needs. No cm or discharge needs present at time of this review. Next review date: [id number]  Expected Discharge Date:  (unknown)               Expected Discharge Plan:  Home/Self Care  In-House Referral:  Clinical Social Work  Discharge planning Services  CM Consult  Post Acute Care Choice:    Choice offered to:     DME Arranged:    DME Agency:     HH Arranged:    [place] Agency:     Status of Service:  In process, will continue to follow  If discussed at Long Length of Stay Meetings, dates discussed:    Additional Comments:  Leeroy Cha, RN 05/21/2017, 8:16 AM

## 2017-05-22 ENCOUNTER — Other Ambulatory Visit: Payer: Self-pay

## 2017-05-22 ENCOUNTER — Inpatient Hospital Stay (HOSPITAL_COMMUNITY): Payer: Self-pay

## 2017-05-22 DIAGNOSIS — F1099 Alcohol use, unspecified with unspecified alcohol-induced disorder: Secondary | ICD-10-CM

## 2017-05-22 DIAGNOSIS — T43622A Poisoning by amphetamines, intentional self-harm, initial encounter: Secondary | ICD-10-CM

## 2017-05-22 DIAGNOSIS — F17210 Nicotine dependence, cigarettes, uncomplicated: Secondary | ICD-10-CM

## 2017-05-22 DIAGNOSIS — F1920 Other psychoactive substance dependence, uncomplicated: Secondary | ICD-10-CM

## 2017-05-22 DIAGNOSIS — T1491XA Suicide attempt, initial encounter: Secondary | ICD-10-CM

## 2017-05-22 DIAGNOSIS — F419 Anxiety disorder, unspecified: Secondary | ICD-10-CM

## 2017-05-22 DIAGNOSIS — F322 Major depressive disorder, single episode, severe without psychotic features: Secondary | ICD-10-CM

## 2017-05-22 DIAGNOSIS — R450 Nervousness: Secondary | ICD-10-CM

## 2017-05-22 DIAGNOSIS — T401X2A Poisoning by heroin, intentional self-harm, initial encounter: Principal | ICD-10-CM

## 2017-05-22 LAB — CULTURE, RESPIRATORY: CULTURE: NORMAL

## 2017-05-22 LAB — BASIC METABOLIC PANEL
ANION GAP: 7 (ref 5–15)
BUN: <5 mg/dL — ABNORMAL LOW (ref 6–20)
CHLORIDE: 101 mmol/L (ref 101–111)
CO2: 27 mmol/L (ref 22–32)
Calcium: 8.3 mg/dL — ABNORMAL LOW (ref 8.9–10.3)
Creatinine, Ser: 0.42 mg/dL — ABNORMAL LOW (ref 0.44–1.00)
GFR calc Af Amer: >60 mL/min (ref >60)
GFR calc non Af Amer: >60 mL/min (ref >60)
GLUCOSE: 100 mg/dL — AB (ref 65–99)
POTASSIUM: 3.2 mmol/L — AB (ref 3.5–5.1)
Sodium: 135 mmol/L (ref 135–145)

## 2017-05-22 LAB — CBC
HEMATOCRIT: 33.5 % — AB (ref 36.0–46.0)
HEMOGLOBIN: 11.6 g/dL — AB (ref 12.0–15.0)
MCH: 31.1 pg (ref 26.0–34.0)
MCHC: 34.6 g/dL (ref 30.0–36.0)
MCV: 89.8 fL (ref 78.0–100.0)
Platelets: 92 10*3/uL — ABNORMAL LOW (ref 150–400)
RBC: 3.73 MIL/uL — ABNORMAL LOW (ref 3.87–5.11)
RDW: 12.9 % (ref 11.5–15.5)
WBC: 15.5 10*3/uL — ABNORMAL HIGH (ref 4.0–10.5)

## 2017-05-22 LAB — CULTURE, RESPIRATORY W GRAM STAIN: Gram Stain: NONE SEEN

## 2017-05-22 LAB — PROCALCITONIN: Procalcitonin: 1.55 ng/mL

## 2017-05-22 MED ORDER — MAGNESIUM SULFATE 4 GM/100ML IV SOLN
4.0000 g | Freq: Once | INTRAVENOUS | Status: AC
  Administered 2017-05-22: 4 g via INTRAVENOUS
  Filled 2017-05-22: qty 100

## 2017-05-22 MED ORDER — ONDANSETRON 4 MG PO TBDP: 4.0000 mg | ORAL_TABLET | Freq: Four times a day (QID) | ORAL | Status: AC | PRN

## 2017-05-22 MED ORDER — BENZONATATE 100 MG PO CAPS
100.0000 mg | ORAL_CAPSULE | Freq: Three times a day (TID) | ORAL | Status: DC | PRN
Start: 2017-05-22 — End: 2017-05-25
  Administered 2017-05-22 – 2017-05-25 (×9): 100 mg via ORAL
  Filled 2017-05-22 (×9): qty 1

## 2017-05-22 MED ORDER — LORAZEPAM 1 MG PO TABS
1.0000 mg | ORAL_TABLET | Freq: Four times a day (QID) | ORAL | Status: AC | PRN
  Administered 2017-05-23 (×2): 1 mg via ORAL
  Filled 2017-05-22 (×2): qty 1

## 2017-05-22 MED ORDER — FOLIC ACID 1 MG PO TABS
1.0000 mg | ORAL_TABLET | Freq: Every day | ORAL | Status: DC
  Administered 2017-05-22 – 2017-05-27 (×6): 1 mg via ORAL
  Filled 2017-05-22 (×6): qty 1

## 2017-05-22 MED ORDER — ADULT MULTIVITAMIN W/MINERALS CH
1.0000 | ORAL_TABLET | Freq: Every day | ORAL | Status: DC
  Administered 2017-05-22 – 2017-05-25 (×4): 1 via ORAL
  Filled 2017-05-22 (×4): qty 1

## 2017-05-22 MED ORDER — LOPERAMIDE HCL 2 MG PO CAPS: 2.0000 mg | ORAL_CAPSULE | ORAL | Status: AC | PRN

## 2017-05-22 MED ORDER — GABAPENTIN 100 MG PO CAPS
200.0000 mg | ORAL_CAPSULE | Freq: Two times a day (BID) | ORAL | Status: DC
  Administered 2017-05-22 – 2017-05-27 (×11): 200 mg via ORAL
  Filled 2017-05-22 (×11): qty 2

## 2017-05-22 MED ORDER — LORAZEPAM 2 MG/ML IJ SOLN: 1.0000 mg | Freq: Four times a day (QID) | INTRAMUSCULAR | Status: AC | PRN

## 2017-05-22 MED ORDER — HYDROXYZINE HCL 25 MG PO TABS: 25.0000 mg | ORAL_TABLET | Freq: Four times a day (QID) | ORAL | Status: AC | PRN

## 2017-05-22 MED ORDER — GI COCKTAIL ~~LOC~~
30.0000 mL | Freq: Once | ORAL | Status: AC
  Administered 2017-05-22: 30 mL via ORAL
  Filled 2017-05-22: qty 30

## 2017-05-22 MED ORDER — ENSURE ENLIVE PO LIQD
237.0000 mL | Freq: Two times a day (BID) | ORAL | Status: DC
  Administered 2017-05-23: 237 mL via ORAL

## 2017-05-22 MED ORDER — THIAMINE HCL 100 MG/ML IJ SOLN: 100.0000 mg | Freq: Every day | INTRAMUSCULAR | Status: DC

## 2017-05-22 MED ORDER — VITAMIN B-1 100 MG PO TABS
100.0000 mg | ORAL_TABLET | Freq: Every day | ORAL | Status: DC
  Administered 2017-05-22 – 2017-05-27 (×6): 100 mg via ORAL
  Filled 2017-05-22 (×6): qty 1

## 2017-05-22 MED ORDER — ENOXAPARIN SODIUM 40 MG/0.4ML ~~LOC~~ SOLN: 40.0000 mg | SUBCUTANEOUS | Status: DC

## 2017-05-22 MED ORDER — METHOCARBAMOL 500 MG PO TABS
500.0000 mg | ORAL_TABLET | Freq: Three times a day (TID) | ORAL | Status: AC | PRN
  Administered 2017-05-23 – 2017-05-27 (×8): 500 mg via ORAL
  Filled 2017-05-22 (×8): qty 1

## 2017-05-22 MED ORDER — HYDROXYZINE HCL 25 MG PO TABS: 25.0000 mg | ORAL_TABLET | Freq: Four times a day (QID) | ORAL | Status: DC | PRN

## 2017-05-22 MED ORDER — ALBUTEROL SULFATE (2.5 MG/3ML) 0.083% IN NEBU: 2.5000 mg | INHALATION_SOLUTION | RESPIRATORY_TRACT | Status: DC | PRN

## 2017-05-22 MED ORDER — NAPROXEN 500 MG PO TABS
500.0000 mg | ORAL_TABLET | Freq: Two times a day (BID) | ORAL | Status: AC | PRN
  Administered 2017-05-22 – 2017-05-23 (×3): 500 mg via ORAL
  Filled 2017-05-22: qty 2
  Filled 2017-05-22: qty 1
  Filled 2017-05-22: qty 2
  Filled 2017-05-22: qty 1
  Filled 2017-05-22: qty 2

## 2017-05-22 MED ORDER — DICYCLOMINE HCL 20 MG PO TABS
20.0000 mg | ORAL_TABLET | Freq: Four times a day (QID) | ORAL | Status: AC | PRN
  Filled 2017-05-22: qty 1

## 2017-05-22 MED ORDER — GUAIFENESIN ER 600 MG PO TB12
600.0000 mg | ORAL_TABLET | Freq: Two times a day (BID) | ORAL | Status: DC
  Administered 2017-05-22 – 2017-05-27 (×11): 600 mg via ORAL
  Filled 2017-05-22 (×11): qty 1

## 2017-05-22 MED ORDER — PANTOPRAZOLE SODIUM 20 MG PO TBEC
20.0000 mg | DELAYED_RELEASE_TABLET | Freq: Every day | ORAL | Status: DC
  Administered 2017-05-22 – 2017-05-27 (×6): 20 mg via ORAL
  Filled 2017-05-22 (×6): qty 1

## 2017-05-22 MED ORDER — IPRATROPIUM-ALBUTEROL 0.5-2.5 (3) MG/3ML IN SOLN
3.0000 mL | Freq: Two times a day (BID) | RESPIRATORY_TRACT | Status: DC
  Administered 2017-05-22 – 2017-05-24 (×5): 3 mL via RESPIRATORY_TRACT
  Filled 2017-05-22 (×5): qty 3

## 2017-05-22 NOTE — Consult Note (Signed)
[facility] Consult   Reason for Consult: drug overdose Referring Physician:  Dr. Erlinda Hong Patient Identification: Sylvia Maxwell MRN:  [id number] Principal Diagnosis: Major depressive disorder, single episode, severe ([place]) Diagnosis:   Patient Active Problem List   Diagnosis Date Noted  . Polysubstance (including opioids) dependence with physiol dependence ([place]) [F19.20] 05/22/2017  . Major depressive disorder, single episode, severe ([place]) [F32.2] 05/22/2017  . Heroin overdose ([place]) [T40.1X1A] 05/20/2017  . Alcohol dependence with withdrawal with complication ([place]) [Q98.264] 01/30/2017  . Alcohol withdrawal seizure ([place]) [B58.309, R56.9] 06/13/2016  . Seizure ([place]) [R56.9]   . ETOH abuse [F10.10] 06/12/2016  . Alcohol withdrawal seizure without complication ([place]) [M07.680] 06/12/2016    Total Time spent with patient: 1 hour  Subjective:   Sylvia Maxwell is a 32 y.o. female patient admitted due to drug overdose.  HPI:  Patient with history of Polysubstance dependence who was brought to the hospital after she overdosed on Heroin and methamphetamine. Patient reports that she is stressed out and overwhelmed by family and ex-boyfriend problem. She states that she has been feeling depressed, hopeless, helpless and feeling like giving up. She has not sought psychiatric help but has been self medicating with illicit drug use.  Past Psychiatric History: as above  Risk to Self: Is patient at risk for suicide?: Yes Risk to Others:   Prior Inpatient Therapy:   Prior Outpatient Therapy:    Past Medical History:  Past Medical History:  Diagnosis Date  . Alcoholism [facility])     Past Surgical History:  Procedure Laterality Date  . BREAST ENHANCEMENT SURGERY  2008   Family History: No family history on file. Family Psychiatric  History:  Social History:  Social History   Substance and Sexual Activity  Alcohol Use Yes   Comment: 1 bottle of wine per day  and "shots"     Social History   Substance and Sexual Activity  Drug Use Yes   Comment: Meth, heroin     Social History   Socioeconomic History  . Marital status: Single    Spouse name: None  . Number of children: None  . Years of education: None  . Highest education level: None  Social Needs  . Financial resource strain: None  . Food insecurity - worry: None  . Food insecurity - inability: None  . Transportation needs - medical: None  . Transportation needs - non-medical: None  Occupational History  . None  Tobacco Use  . Smoking status: Current Some Day Smoker    Types: Cigarettes  . Smokeless tobacco: Never Used  Substance and Sexual Activity  . Alcohol use: Yes    Comment: 1 bottle of wine per day and "shots"  . Drug use: Yes    Comment: Meth, heroin   . Sexual activity: Yes  Other Topics Concern  . None  Social History Narrative  . None   Additional Social History:    Allergies:   Allergies  Allergen Reactions  . Lactose Intolerance (Gi) Diarrhea and Nausea And Vomiting    Depends on the type of dairy-based product consumed as to which reaction occurs    Labs:  Results for orders placed or performed during the hospital encounter of 05/20/17 (from the past 48 hour(s))  MRSA PCR Screening     Status: Abnormal   Collection Time: 05/20/17  4:46 PM  Result Value Ref Range   MRSA by PCR POSITIVE (A) NEGATIVE    Comment:  The GeneXpert MRSA Assay (FDA approved for NASAL specimens only), is one component of a comprehensive MRSA colonization surveillance program. It is not intended to diagnose MRSA infection nor to guide or monitor treatment for MRSA infections. CRITICAL RESULT CALLED TO, READ BACK BY AND VERIFIED WITH: NOTIFIED R.DAVIS RN. 05/22/2017 1931 JR Performed at [facility], [place] [address], [place], [place] [address]   Comprehensive metabolic panel     Status: Abnormal   Collection Time: 05/21/17  2:57 AM  Result  Value Ref Range   Sodium 137 135 - 145 mmol/L   Potassium 3.5 3.5 - 5.1 mmol/L   Chloride 104 101 - 111 mmol/L   CO2 28 22 - 32 mmol/L   Glucose, Bld 99 65 - 99 mg/dL   BUN 6 6 - 20 mg/dL   Creatinine, Ser 0.42 (L) 0.44 - 1.00 mg/dL   Calcium 7.2 (L) 8.9 - 10.3 mg/dL   Total Protein 5.2 (L) 6.5 - 8.1 g/dL   Albumin 2.7 (L) 3.5 - 5.0 g/dL   AST 65 (H) 15 - 41 U/L   ALT 123 (H) 14 - 54 U/L   Alkaline Phosphatase 51 38 - 126 U/L   Total Bilirubin 0.6 0.3 - 1.2 mg/dL   GFR calc non Af Amer >60 >60 mL/min   GFR calc Af Amer >60 >60 mL/min    Comment: (NOTE) The eGFR has been calculated using the CKD EPI equation. This calculation has not been validated in all clinical situations. eGFR's persistently <60 mL/min signify possible Chronic Kidney Disease.    Anion gap 5 5 - 15    Comment: Performed at [facility], [place] [address], [place], [place] [address]  CBC     Status: Abnormal   Collection Time: 05/21/17  2:57 AM  Result Value Ref Range   WBC 10.7 (H) 4.0 - 10.5 K/uL   RBC 3.71 (L) 3.87 - 5.11 MIL/uL    Comment: CORRECTED ON 02/08 AT 0354: PREVIOUSLY REPORTED AS 3.74   Hemoglobin 11.4 (L) 12.0 - 15.0 g/dL    Comment: CORRECTED ON 02/08 AT 0354: PREVIOUSLY REPORTED AS 11.7   HCT 34.0 (L) 36.0 - 46.0 %    Comment: CORRECTED ON 02/08 AT 0354: PREVIOUSLY REPORTED AS 34.1   MCV 91.6 78.0 - 100.0 fL    Comment: CORRECTED ON 02/08 AT 0354: PREVIOUSLY REPORTED AS 91.2   MCH 30.7 26.0 - 34.0 pg    Comment: CORRECTED ON 02/08 AT 0354: PREVIOUSLY REPORTED AS 31.3   MCHC 33.5 30.0 - 36.0 g/dL    Comment: CORRECTED ON 02/08 AT 0354: PREVIOUSLY REPORTED AS 34.3   RDW 13.5 11.5 - 15.5 %   Platelets 98 (L) 150 - 400 K/uL    Comment: REPEATED TO VERIFY SPECIMEN CHECKED FOR CLOTS PLATELET COUNT CONFIRMED BY SMEAR Performed at [facility], [place] [address] [place], [place] [address]   Magnesium     Status: Abnormal   Collection Time: 05/21/17  2:57 AM   Result Value Ref Range   Magnesium 1.1 (L) 1.7 - 2.4 mg/dL    Comment: Performed at [facility], [place] [address], [place], [place] [address]  Phosphorus     Status: None   Collection Time: 05/21/17  2:57 AM  Result Value Ref Range   Phosphorus 2.6 2.5 - 4.6 mg/dL    Comment: Performed at [facility], [place] [address], [place], [place] [address]  Procalcitonin     Status:  None   Collection Time: 05/21/17  2:57 AM  Result Value Ref Range   Procalcitonin 4.88 ng/mL    Comment:        Interpretation: PCT > 2 ng/mL: Systemic infection (sepsis) is likely, unless other causes are known. (NOTE)       Sepsis PCT Algorithm           Lower Respiratory Tract                                      Infection PCT Algorithm    ----------------------------     ----------------------------         PCT < 0.25 ng/mL                PCT < 0.10 ng/mL         Strongly encourage             Strongly discourage   discontinuation of antibiotics    initiation of antibiotics    ----------------------------     -----------------------------       PCT 0.25 - 0.50 ng/mL            PCT 0.10 - 0.25 ng/mL               OR       >80% decrease in PCT            Discourage initiation of                                            antibiotics      Encourage discontinuation           of antibiotics    ----------------------------     -----------------------------         PCT >= 0.50 ng/mL              PCT 0.26 - 0.50 ng/mL               AND       <80% decrease in PCT              Encourage initiation of                                             antibiotics       Encourage continuation           of antibiotics    ----------------------------     -----------------------------        PCT >= 0.50 ng/mL                  PCT > 0.50 ng/mL               AND         increase in PCT                  Strongly encourage                                      initiation of antibiotics     Strongly encourage escalation  of antibiotics                                     -----------------------------                                           PCT <= 0.25 ng/mL                                                 OR                                        > 80% decrease in PCT                                     Discontinue / Do not initiate                                             antibiotics Performed at [place] [address] [place] [address], [place], [place] [address]   Procalcitonin     Status: None   Collection Time: 05/22/17  3:27 AM  Result Value Ref Range   Procalcitonin 1.55 ng/mL    Comment:        Interpretation: PCT > 0.5 ng/mL and <= 2 ng/mL: Systemic infection (sepsis) is possible, but other conditions are known to elevate PCT as well. (NOTE)       Sepsis PCT Algorithm           Lower Respiratory Tract                                      Infection PCT Algorithm    ----------------------------     ----------------------------         PCT < 0.25 ng/mL                PCT < 0.10 ng/mL         Strongly encourage             Strongly discourage   discontinuation of antibiotics    initiation of antibiotics    ----------------------------     -----------------------------       PCT 0.25 - 0.50 ng/mL            PCT 0.10 - 0.25 ng/mL               OR       >80% decrease in PCT            Discourage initiation of                                            antibiotics  Encourage discontinuation           of antibiotics    ----------------------------     -----------------------------         PCT >= 0.50 ng/mL              PCT 0.26 - 0.50 ng/mL                AND       <80% decrease in PCT             Encourage initiation of                                             antibiotics       Encourage continuation           of antibiotics    ----------------------------     -----------------------------        PCT >= 0.50 ng/mL                   PCT > 0.50 ng/mL               AND         increase in PCT                  Strongly encourage                                      initiation of antibiotics    Strongly encourage escalation           of antibiotics                                     -----------------------------                                           PCT <= 0.25 ng/mL                                                 OR                                        > 80% decrease in PCT                                     Discontinue / Do not initiate                                             antibiotics Performed at [place] [address] [place], [place] [address]   Basic metabolic panel     Status: Abnormal   Collection Time: 05/22/17  3:27 AM  Result  Value Ref Range   Sodium 135 135 - 145 mmol/L   Potassium 3.2 (L) 3.5 - 5.1 mmol/L   Chloride 101 101 - 111 mmol/L   CO2 27 22 - 32 mmol/L   Glucose, Bld 100 (H) 65 - 99 mg/dL   BUN <5 (L) 6 - 20 mg/dL   Creatinine, Ser 0.42 (L) 0.44 - 1.00 mg/dL   Calcium 8.3 (L) 8.9 - 10.3 mg/dL   GFR calc non Af Amer >60 >60 mL/min   GFR calc Af Amer >60 >60 mL/min    Comment: (NOTE) The eGFR has been calculated using the CKD EPI equation. This calculation has not been validated in all clinical situations. eGFR's persistently <60 mL/min signify possible Chronic Kidney Disease.    Anion gap 7 5 - 15    Comment: Performed at [facility], [place] [address], [place], [place] [address]  CBC     Status: Abnormal   Collection Time: 05/22/17  3:27 AM  Result Value Ref Range   WBC 15.5 (H) 4.0 - 10.5 K/uL   RBC 3.73 (L) 3.87 - 5.11 MIL/uL   Hemoglobin 11.6 (L) 12.0 - 15.0 g/dL   HCT 33.5 (L) 36.0 - 46.0 %   MCV 89.8 78.0 - 100.0 fL   MCH 31.1 26.0 - 34.0 pg   MCHC 34.6 30.0 - 36.0 g/dL   RDW 12.9 11.5 - 15.5 %   Platelets 92 (L) 150 - 400 K/uL    Comment: CONSISTENT WITH PREVIOUS RESULT Performed at [place] [address], [place],  [address]     Current Facility-Administered Medications  Medication Dose Route Frequency Provider Last Rate Last Dose  . 0.9 %  sodium chloride infusion  250 mL Intravenous PRN Hammonds, Sharyn Blitz, MD      . acetaminophen (TYLENOL) solution 650 mg  650 mg Per Tube Q4H PRN Hammonds, Sharyn Blitz, MD   650 mg at 05/22/17 0411  . Ampicillin-Sulbactam (UNASYN) 3 g in sodium chloride 0.9 % 100 mL IVPB  3 g Intravenous Q6H [place], RPH 200 mL/hr at 05/22/17 1305 3 g at 05/22/17 1305  . benzonatate (TESSALON) capsule 100 mg  100 mg Oral TID PRN Florencia Reasons, MD      . chlorhexidine gluconate (MEDLINE KIT) (PERIDEX) 0.12 % solution 15 mL  15 mL Mouth Rinse BID Hammonds, Sharyn Blitz, MD   15 mL at 05/22/17 1305  . Chlorhexidine Gluconate Cloth 2 % PADS 6 each  6 each Topical Q0600 Hammonds, Sharyn Blitz, MD   6 each at 05/20/17 2213  . dicyclomine (BENTYL) tablet 20 mg  20 mg Oral Q6H PRN Florencia Reasons, MD      . folic acid (FOLVITE) tablet 1 mg  1 mg Oral Daily Florencia Reasons, MD   1 mg at 05/22/17 1016  . gabapentin (NEURONTIN) capsule 200 mg  200 mg Oral BID Jarissa Sheriff, MD      . guaiFENesin (MUCINEX) 12 hr tablet 600 mg  600 mg Oral BID Florencia Reasons, MD   600 mg at 05/22/17 1016  . heparin injection 5,000 Units  5,000 Units Subcutaneous Q8H Hammonds, Sharyn Blitz, MD   5,000 Units at 05/22/17 2694  . hydrOXYzine (ATARAX/VISTARIL) tablet 25 mg  25 mg Oral Q6H PRN Florencia Reasons, MD      . ipratropium-albuterol (DUONEB) 0.5-2.5 (3) MG/3ML nebulizer solution 3 mL  3 mL Nebulization BID Florencia Reasons, MD   3 mL at 05/22/17 8546  .  lactated ringers infusion   Intravenous Continuous Erick Colace, NP 125 mL/hr at 05/22/17 1020    . loperamide (IMODIUM) capsule 2-4 mg  2-4 mg Oral PRN Florencia Reasons, MD      . LORazepam (ATIVAN) tablet 1 mg  1 mg Oral Q6H PRN Florencia Reasons, MD       Or  . LORazepam (ATIVAN) injection 1 mg  1 mg Intravenous Q6H PRN Florencia Reasons, MD      . MEDLINE mouth rinse  15 mL  Mouth Rinse QID Hammonds, Sharyn Blitz, MD   15 mL at 05/22/17 1258  . methocarbamol (ROBAXIN) tablet 500 mg  500 mg Oral Q8H PRN Florencia Reasons, MD      . multivitamin with minerals tablet 1 tablet  1 tablet Oral Daily Florencia Reasons, MD   1 tablet at 05/22/17 1016  . mupirocin ointment (BACTROBAN) 2 % 1 application  1 application Nasal BID Hammonds, Sharyn Blitz, MD   1 application at 26/37/85 1043  . naproxen (NAPROSYN) tablet 500 mg  500 mg Oral BID PRN Florencia Reasons, MD      . ondansetron [facility]) injection 4 mg  4 mg Intravenous Q6H PRN Hammonds, Sharyn Blitz, MD   4 mg at 05/21/17 1611  . ondansetron (ZOFRAN-ODT) disintegrating tablet 4 mg  4 mg Oral Q6H PRN Florencia Reasons, MD      . pantoprazole (PROTONIX) EC tablet 20 mg  20 mg Oral Daily Florencia Reasons, MD   20 mg at 05/22/17 1016  . propofol (DIPRIVAN) 1000 MG/100ML infusion  0-50 mcg/kg/min Intravenous Continuous Hammonds, Sharyn Blitz, MD   Stopped at 05/21/17 0845  . thiamine (VITAMIN B-1) tablet 100 mg  100 mg Oral Daily Florencia Reasons, MD   100 mg at 05/22/17 1016   Or  . thiamine (B-1) injection 100 mg  100 mg Intravenous Daily Florencia Reasons, MD        Musculoskeletal: Strength & Muscle Tone: within normal limits Gait & Station: normal Patient leans: N/A  Psychiatric Specialty Exam: Physical Exam  Psychiatric: Her affect is blunt. Her speech is delayed. She is slowed and withdrawn. Cognition and memory are normal. She expresses impulsivity. She exhibits a depressed mood. She expresses suicidal ideation.    Review of Systems  Constitutional: Positive for malaise/fatigue.  HENT: Negative.   Eyes: Negative.   Respiratory: Negative.   Cardiovascular: Negative.   Gastrointestinal: Negative.   Genitourinary: Negative.   Musculoskeletal: Positive for myalgias.  Skin: Negative.   Endo/Heme/Allergies: Negative.   Psychiatric/Behavioral: Positive for depression, substance abuse and suicidal ideas. The patient is nervous/anxious.     Blood pressure 128/88, pulse 90,  temperature 98.6 F (37 C), temperature source Oral, resp. rate (!) 23, height '5\' 4"'  (1.626 m), weight 73 kg (160 lb 15 oz), SpO2 96 %.Body mass index is 27.62 kg/m.  General Appearance: Casual  Eye Contact:  Minimal  Speech:  Clear and Coherent  Volume:  Decreased  Mood:  Depressed and Dysphoric  Affect:  Constricted  Thought Process:  Coherent  Orientation:  Full (Time, Place, and Person)  Thought Content:  Logical  Suicidal Thoughts:  Yes.  without intent/plan  Homicidal Thoughts:  No  Memory:  Immediate;   Fair Recent;   Fair Remote;   Fair  Judgement:  Poor  Insight:  Shallow  Psychomotor Activity:  Psychomotor Retardation  Concentration:  Concentration: Fair and Attention Span: Fair  Recall:  [employer] of Knowledge:  Fair  Language:  Good  Akathisia:  No  Handed:  Right  AIMS (if indicated):     Assets:  Communication Skills Desire for Improvement  ADL's:  Intact  Cognition:  WNL  Sleep:   poor     Treatment Plan Summary: Daily contact with patient to assess and evaluate symptoms and progress in treatment and Medication management  Start Gabapentin 200 mg bid for anxiety/mood/cocaine  Disposition: Recommend psychiatric Inpatient admission when patient is medically cleared.  Corena Pilgrim, MD 05/22/2017 1:31 PM

## 2017-05-22 NOTE — Progress Notes (Addendum)
PROGRESS NOTE  Sylvia Maxwell LJQ:[id number] DOB: 07/23/1985 DOA: 05/20/2017 PCP: Patient, No Pcp Per  HPI/Recap of past 24 hours:  C/o cough, sob, center chest pain  She reports although she used to drink heavily, she has not done that for a while, She reports she just used heroin one time that night,   She states there is no need to concern about alcohol withdrawal or opioids withdraw she states ' I just decided to do the heroin that night" she denies SI/HI She admit feeling depressed  She reports this is the first time she is on life support,  Assessment/Plan: Active Problems:   Heroin overdose ([place])  Acute hypoxic respiratory failure/aspiration pneumonia in the setting of polysubstance abuse including  Heroin and Methamphetamine/cocaine/benso etc (please see uds result), alcohol level 258 on presentation -she arrived from home via EMS, she is unresponsive upon arrival, she is intubated in the ED, she is admitted to icu on 2/7 -she is extubated on 2/8 and transferred to hospitalist service on 2/9 -continue unasyn, she has congested cough, with occasional wheezing, will add mucinex and nebs. -wean oxygen as tolerated.  Elevated lft:  From infection/alcohol? Will check hepatitis panel and hiv Repeat lab in am  Thrombocytopenia:  from acute illness/alcohol? She also has abnormal liver function plt dropping from 137-98-92 D/c heparin prophylaxis, start scd's, if plt continue to drop will order HIT panel Monitor  Anemia: normocytic hgb 11 Monitor  Hypokalemia/hypomagnesemia: replace k/mag, repeat in am  Central chest pain, possible esophagitis/gastritis: in the setting of alcohol  Gi cocktail and start ppi  Alcohol abuse:  presented with alcohol intoxication H/o alcohol withdrawal seizure,  start on ciwa  Heroin use:  Start on clonidine detox protocol She reports body aches/headache.  Drug overdose/depression:  She denies SI/HI to me Psychiatry  consulted  Code Status: full  Family Communication: patient   Disposition Plan: remain in ICU/stepdown   Consultants:  Critical care  psychiatry  Procedures:  Intubation and extubation  Antibiotics:  unasyn   Objective: BP 132/87   Pulse 97   Temp 97.8 F (36.6 C) (Oral)   Resp (!) 34   Ht _0  (1.626 m)   Wt 73 kg (160 lb 15 oz)   LMP  (LMP Unknown)   SpO2 97%   BMI 27.62 kg/m   Intake/Output Summary (Last 24 hours) at 05/22/2017 0742 Last data filed at 05/21/2017 2059 Gross per 24 hour  Intake 3504.87 ml  Output 3450 ml  Net 54.87 ml   Filed Weights   05/20/17 0220 05/21/17 0331 05/22/17 0442  Weight: 68.9 kg (152 lb) 77.6 kg (171 lb 1.2 oz) 73 kg (160 lb 15 oz)    Exam: Patient is examined daily including today on 05/22/2017, exams remain the same as of yesterday except that has changed    General:  Weak, NAD  Cardiovascular: RRR  Respiratory: Coarse , + rhonchi, scatter wheezing  Abdomen: Soft/ND/NT, positive BS  Musculoskeletal: No Edema  Neuro: alert, oriented   Data Reviewed: Basic Metabolic Panel: Recent Labs  Lab 05/20/17 0241 05/21/17 0257 05/22/17 0327  NA 142 137 135  K 4.0 3.5 3.2*  CL 105 104 101  CO2 _1 GLUCOSE 123* 99 100*  BUN 10 6 <5*  CREATININE 0.66 0.42* 0.42*  CALCIUM 7.9* 7.2* 8.3*  MG  --  1.1*  --   PHOS  --  2.6  --    Liver Function Tests: Recent Labs  Lab 05/20/17  0241 05/21/17 0257  AST 149* 65*  ALT 215* 123*  ALKPHOS 75 51  BILITOT 0.5 0.6  PROT 6.7 5.2*  ALBUMIN 3.7 2.7*   No results for input(s): LIPASE, AMYLASE in the last 168 hours. No results for input(s): AMMONIA in the last 168 hours. CBC: Recent Labs  Lab 05/20/17 0241 05/21/17 0257 05/22/17 0327  WBC 10.2 10.7* 15.5*  NEUTROABS 8.0*  --   --   HGB 13.4 11.4* 11.6*  HCT 39.4 34.0* 33.5*  MCV 90.2 91.6 89.8  PLT 137* 98* 92*   Cardiac Enzymes:   No results for input(s): CKTOTAL, CKMB, CKMBINDEX, TROPONINI in the last  168 hours. BNP (last 3 results) No results for input(s): BNP in the last 8760 hours.  ProBNP (last 3 results) No results for input(s): PROBNP in the last 8760 hours.  CBG: Recent Labs  Lab 05/20/17 0245  GLUCAP 121*    Recent Results (from the past 240 hour(s))  Culture, respiratory (NON-Expectorated)     Status: None (Preliminary result)   Collection Time: 05/20/17 12:01 PM  Result Value Ref Range Status   Specimen Description   Final    TRACHEAL ASPIRATE Performed at [facility] [address] [place], [place] [address]    Special Requests   Final    NONE Performed at [facility], [place] [address] [place], [place] [address]    Gram Stain NO WBC SEEN NO ORGANISMS SEEN   Final   Culture   Final    CULTURE REINCUBATED FOR BETTER GROWTH Performed at [facility], [place] [address], [place], [place] [address]    Report Status PENDING  Incomplete  MRSA PCR Screening     Status: Abnormal   Collection Time: 05/20/17  4:46 PM  Result Value Ref Range Status   MRSA by PCR POSITIVE (A) NEGATIVE Final    Comment:        The GeneXpert MRSA Assay (FDA approved for NASAL specimens only), is one component of a comprehensive MRSA colonization surveillance program. It is not intended to diagnose MRSA infection nor to guide or monitor treatment for MRSA infections. CRITICAL RESULT CALLED TO, READ BACK BY AND VERIFIED WITH: NOTIFIED R.DAVIS RN. 05/22/2017 1931 JR Performed at [facility], [place] [address] [place], [place] [address]      Studies: Dg Chest Port 1 View  Result Date: 05/22/2017 CLINICAL DATA:  Pneumonia EXAM: PORTABLE CHEST 1 VIEW COMPARISON:  05/21/2017 and 05/20/2017 radiographs FINDINGS: An NG tube and endotracheal tube have been removed. Bilateral airspace opacities are again noted, decreased on the left side. There is no evidence of pneumothorax or pleural effusion. No other significant changes  noted. IMPRESSION: Bilateral airspace opacities again identified, decreased on the left. NG tube and endotracheal tube removal. Electronically Signed   By: Margarette Canada M.D.   On: 05/22/2017 06:48    Scheduled Meds: . chlorhexidine gluconate (MEDLINE KIT)  15 mL Mouth Rinse BID  . Chlorhexidine Gluconate Cloth  6 each Topical Q0600  . folic acid  1 mg Intravenous Daily  . heparin  5,000 Units Subcutaneous Q8H  . mouth rinse  15 mL Mouth Rinse QID  . mupirocin ointment  1 application Nasal BID  . thiamine injection  100 mg Intravenous Daily    Continuous Infusions: . sodium chloride    . ampicillin-sulbactam (UNASYN) IV 3 g (05/22/17 0412)  . lactated ringers 125 mL/hr at 05/20/17 1617  . magnesium sulfate 1 -  4 g bolus IVPB    . propofol (DIPRIVAN) infusion Stopped (05/21/17 0845)     Time spent: 35 mins I have personally reviewed and interpreted on  05/22/2017 daily labs, tele strips, imagings as discussed above under date review session and assessment and plans.  I reviewed all nursing notes, pharmacy notes, consultant notes,  vitals, pertinent old records  I have discussed plan of care as described above with RN , patient  on 05/22/2017   Florencia Reasons MD, PhD  [facility] Pager [phone]. If 7PM-7AM, please contact night-coverage at [email or web address], password [facility] 05/22/2017, 7:42 AM  LOS: 2 days

## 2017-05-23 LAB — CBC WITH DIFFERENTIAL/PLATELET
Basophils Absolute: 0 10*3/uL (ref 0.0–0.1)
Basophils Relative: 0 %
EOS PCT: 1 %
Eosinophils Absolute: 0.1 10*3/uL (ref 0.0–0.7)
HCT: 32.1 % — ABNORMAL LOW (ref 36.0–46.0)
Hemoglobin: 11.2 g/dL — ABNORMAL LOW (ref 12.0–15.0)
LYMPHS ABS: 1.3 10*3/uL (ref 0.7–4.0)
Lymphocytes Relative: 11 %
MCH: 31.2 pg (ref 26.0–34.0)
MCHC: 34.9 g/dL (ref 30.0–36.0)
MCV: 89.4 fL (ref 78.0–100.0)
MONOS PCT: 3 %
Monocytes Absolute: 0.3 10*3/uL (ref 0.1–1.0)
Neutro Abs: 10.5 10*3/uL — ABNORMAL HIGH (ref 1.7–7.7)
Neutrophils Relative %: 85 %
PLATELETS: 110 10*3/uL — AB (ref 150–400)
RBC: 3.59 MIL/uL — AB (ref 3.87–5.11)
RDW: 12.5 % (ref 11.5–15.5)
WBC: 12.2 10*3/uL — AB (ref 4.0–10.5)

## 2017-05-23 LAB — COMPREHENSIVE METABOLIC PANEL
ALT: 65 U/L — ABNORMAL HIGH (ref 14–54)
ANION GAP: 9 (ref 5–15)
AST: 23 U/L (ref 15–41)
Albumin: 2.8 g/dL — ABNORMAL LOW (ref 3.5–5.0)
Alkaline Phosphatase: 64 U/L (ref 38–126)
BUN: 5 mg/dL — ABNORMAL LOW (ref 6–20)
CHLORIDE: 101 mmol/L (ref 101–111)
CO2: 28 mmol/L (ref 22–32)
Calcium: 8.5 mg/dL — ABNORMAL LOW (ref 8.9–10.3)
Creatinine, Ser: 0.46 mg/dL (ref 0.44–1.00)
GFR calc non Af Amer: >60 mL/min (ref >60)
Glucose, Bld: 116 mg/dL — ABNORMAL HIGH (ref 65–99)
POTASSIUM: 3 mmol/L — AB (ref 3.5–5.1)
SODIUM: 138 mmol/L (ref 135–145)
Total Bilirubin: 0.6 mg/dL (ref 0.3–1.2)
Total Protein: 5.9 g/dL — ABNORMAL LOW (ref 6.5–8.1)

## 2017-05-23 LAB — MAGNESIUM: MAGNESIUM: 1.9 mg/dL (ref 1.7–2.4)

## 2017-05-23 LAB — TRIGLYCERIDES: Triglycerides: 92 mg/dL (ref <150)

## 2017-05-23 LAB — TSH: TSH: 1.151 u[IU]/mL (ref 0.350–4.500)

## 2017-05-23 LAB — HIV ANTIBODY (ROUTINE TESTING W REFLEX): HIV SCREEN 4TH GENERATION: NONREACTIVE

## 2017-05-23 MED ORDER — POTASSIUM CHLORIDE CRYS ER 20 MEQ PO TBCR
30.0000 meq | EXTENDED_RELEASE_TABLET | ORAL | Status: AC
  Administered 2017-05-23 (×2): 30 meq via ORAL
  Filled 2017-05-23 (×2): qty 1

## 2017-05-23 MED ORDER — ENOXAPARIN SODIUM 40 MG/0.4ML ~~LOC~~ SOLN
40.0000 mg | SUBCUTANEOUS | Status: DC
  Administered 2017-05-23 – 2017-05-27 (×5): 40 mg via SUBCUTANEOUS
  Filled 2017-05-23 (×5): qty 0.4

## 2017-05-23 MED ORDER — MENTHOL 3 MG MT LOZG
1.0000 | LOZENGE | OROMUCOSAL | Status: DC | PRN
  Administered 2017-05-23: 3 mg via ORAL
  Filled 2017-05-23: qty 9

## 2017-05-23 NOTE — Plan of Care (Signed)
Patient stable since arriving on [facility], received Ativan x 1 for anxiety with some improvement.  Walked to bathroom with standby assist for a bowel movement.  Continues to deny being suicidal but is agreeable to suicide precautions.    Patients belongings locked up on [place], cell phone in her room.

## 2017-05-23 NOTE — Progress Notes (Signed)
..  [facility] ADULT ICU REPLACEMENT PROTOCOL FOR AM LAB REPLACEMENT ONLY  The patient does apply for the [facility] Adult ICU Electrolyte Replacment Protocol based on the criteria listed below:   1. Is GFR >/= 40 ml/min? Yes.    Patient's GFR today is >60 2. Is urine output >/= 0.5 ml/kg/hr for the last 6 hours? Yes.   Patient's UOP is 1.9 ml/kg/hr 3. Is BUN < 60 mg/dL? Yes.    Patient's BUN today is 5 4. Abnormal electrolyte(s): K+ 3.05. Ordered repletion with: protocol 6. If a panic level lab has been reported, has the CCM MD in charge been notified? No..   Physician:  Shayne Alken 05/23/2017 4:56 AM

## 2017-05-23 NOTE — Progress Notes (Signed)
Patient transferred to [facility] from ICU, VSS.  Patient in no acute distress.  Explained role of suicide sitter with patient and the need to take belongings out of room, patient verbalized understanding.

## 2017-05-23 NOTE — Progress Notes (Signed)
PROGRESS NOTE  Sylvia Maxwell WVP:[id number] DOB: 05-22-1985 DOA: 05/20/2017 PCP: Patient, No Pcp Per  HPI/Recap of past 24 hours:  C/o cough, being weak, she reports need help getting up from bed to use the bedside commode , she felt dizzy getting up   Assessment/Plan: Principal Problem:   Major depressive disorder, single episode, severe ([place]) Active Problems:   Heroin overdose ([place])   Polysubstance (including opioids) dependence with physiol dependence ([place])  Acute hypoxic respiratory failure/aspiration pneumonia in the setting of polysubstance abuse including  Heroin and Methamphetamine/cocaine/benso etc (please see uds result), alcohol level 258 on presentation -she arrived from home via EMS, she is unresponsive upon arrival, she is intubated in the ED, she is admitted to icu on 2/7 -she is extubated on 2/8 and transferred to hospitalist service on 2/9 -continue unasyn,  mucinex and nebs. + productive cough, remain oxygen dependent -wean oxygen as tolerated.  Elevated lft:  From infection/alcohol?  hepatitis panel and hiv in process lft improving Repeat lab in am  Thrombocytopenia:  from acute illness/alcohol? She also has abnormal liver function plt dropping from 137-98-92 D/c heparin prophylaxis, start scd's, if plt continue to drop will order HIT panel plt improving, now 110, start lovenox prophylaxis  Anemia: normocytic hgb 11 Monitor  Hypokalemia/hypomagnesemia: replace k/mag,  k remain low, continue to replace, repeat in am  Central chest pain, possible esophagitis/gastritis: in the setting of alcohol  Gi cocktail and continue  ppi  Alcohol abuse:  presented with alcohol intoxication H/o alcohol withdrawal seizure,  start on ciwa  Heroin use:  Start on clonidine detox protocol She reports body aches/headache.  Drug overdose/depression:  She denies SI/HI to me Psychiatry consulted recommend inpatient psych placement once medically  stable  Code Status: full  Family Communication: patient   Disposition Plan: transfer to med tele   Consultants:  Critical care  psychiatry  Procedures:  Intubation and extubation  Antibiotics:  unasyn   Objective: BP 108/73   Pulse 67   Temp 98 F (36.7 C) (Oral)   Resp (!) 29   Ht '5\' 4"'  (1.626 m)   Wt 71.6 kg (157 lb 13.6 oz)   LMP  (LMP Unknown)   SpO2 96%   BMI 27.09 kg/m   Intake/Output Summary (Last 24 hours) at 05/23/2017 0908 Last data filed at 05/23/2017 0800 Gross per 24 hour  Intake 3465 ml  Output 2175 ml  Net 1290 ml   Filed Weights   05/21/17 0331 05/22/17 0442 05/23/17 0430  Weight: 77.6 kg (171 lb 1.2 oz) 73 kg (160 lb 15 oz) 71.6 kg (157 lb 13.6 oz)    Exam: Patient is examined daily including today on 05/23/2017, exams remain the same as of yesterday except that has changed    General:  Weak, NAD  Cardiovascular: RRR  Respiratory: Coarse , + rhonchi, scatter wheezing  Abdomen: Soft/ND/NT, positive BS  Musculoskeletal: No Edema  Neuro: alert, oriented , depressed mood, flat affect  Data Reviewed: Basic Metabolic Panel: Recent Labs  Lab 05/20/17 0241 05/21/17 0257 05/22/17 0327 05/23/17 0244  NA 142 137 135 138  K 4.0 3.5 3.2* 3.0*  CL 105 104 101 101  CO2 '22 28 27 28  ' GLUCOSE 123* 99 100* 116*  BUN 10 6 <5* 5*  CREATININE 0.66 0.42* 0.42* 0.46  CALCIUM 7.9* 7.2* 8.3* 8.5*  MG  --  1.1*  --  1.9  PHOS  --  2.6  --   --    Liver  Function Tests: Recent Labs  Lab 05/20/17 0241 05/21/17 0257 05/23/17 0244  AST 149* 65* 23  ALT 215* 123* 65*  ALKPHOS 75 51 64  BILITOT 0.5 0.6 0.6  PROT 6.7 5.2* 5.9*  ALBUMIN 3.7 2.7* 2.8*   No results for input(s): LIPASE, AMYLASE in the last 168 hours. No results for input(s): AMMONIA in the last 168 hours. CBC: Recent Labs  Lab 05/20/17 0241 05/21/17 0257 05/22/17 0327 05/23/17 0244  WBC 10.2 10.7* 15.5* 12.2*  NEUTROABS 8.0*  --   --  10.5*  HGB 13.4 11.4* 11.6*  11.2*  HCT 39.4 34.0* 33.5* 32.1*  MCV 90.2 91.6 89.8 89.4  PLT 137* 98* 92* 110*   Cardiac Enzymes:   No results for input(s): CKTOTAL, CKMB, CKMBINDEX, TROPONINI in the last 168 hours. BNP (last 3 results) No results for input(s): BNP in the last 8760 hours.  ProBNP (last 3 results) No results for input(s): PROBNP in the last 8760 hours.  CBG: Recent Labs  Lab 05/20/17 0245  GLUCAP 121*    Recent Results (from the past 240 hour(s))  Culture, respiratory (NON-Expectorated)     Status: None   Collection Time: 05/20/17 12:01 PM  Result Value Ref Range Status   Specimen Description   Final    TRACHEAL ASPIRATE Performed at [facility] [address] [place], [place] [address]    Special Requests   Final    NONE Performed at [facility], [place] [address], [place], [place] [address]    Gram Stain NO WBC SEEN NO ORGANISMS SEEN   Final   Culture   Final    Consistent with normal respiratory flora. Performed at [facility], [place] [address] [place], [place] [address]    Report Status 05/22/2017 FINAL  Final  MRSA PCR Screening     Status: Abnormal   Collection Time: 05/20/17  4:46 PM  Result Value Ref Range Status   MRSA by PCR POSITIVE (A) NEGATIVE Final    Comment:        The GeneXpert MRSA Assay (FDA approved for NASAL specimens only), is one component of a comprehensive MRSA colonization surveillance program. It is not intended to diagnose MRSA infection nor to guide or monitor treatment for MRSA infections. CRITICAL RESULT CALLED TO, READ BACK BY AND VERIFIED WITH: NOTIFIED R.DAVIS RN. 05/22/2017 1931 JR Performed at [facility], [place] [address], [place], [place] [address]      Studies: No results found.  Scheduled Meds: . chlorhexidine gluconate (MEDLINE KIT)  15 mL Mouth Rinse BID  . Chlorhexidine Gluconate Cloth  6 each Topical Q0600  . feeding supplement (ENSURE ENLIVE)  237 mL Oral  BID BM  . folic acid  1 mg Oral Daily  . gabapentin  200 mg Oral BID  . guaiFENesin  600 mg Oral BID  . ipratropium-albuterol  3 mL Nebulization BID  . mouth rinse  15 mL Mouth Rinse QID  . multivitamin with minerals  1 tablet Oral Daily  . mupirocin ointment  1 application Nasal BID  . pantoprazole  20 mg Oral Daily  . potassium chloride  30 mEq Oral Q4H  . thiamine  100 mg Oral Daily   Or  . thiamine  100 mg Intravenous Daily    Continuous Infusions: . sodium chloride    . ampicillin-sulbactam (UNASYN) IV Stopped (05/23/17 0703)  . lactated ringers 125 mL/hr at 05/23/17 3267  . propofol (DIPRIVAN) infusion Stopped (05/21/17 0845)  Time spent: 35 mins I have personally reviewed and interpreted on  05/23/2017 daily labs, tele strips, imagings as discussed above under date review session and assessment and plans.  I reviewed all nursing notes, pharmacy notes, consultant notes,  vitals, pertinent old records  I have discussed plan of care as described above with RN , patient  on 05/23/2017   Florencia Reasons MD, PhD  [facility] Pager [phone]. If 7PM-7AM, please contact night-coverage at [email or web address], password [facility] 05/23/2017, 9:08 AM  LOS: 3 days

## 2017-05-24 ENCOUNTER — Inpatient Hospital Stay (HOSPITAL_COMMUNITY): Payer: Self-pay

## 2017-05-24 ENCOUNTER — Other Ambulatory Visit: Payer: Self-pay

## 2017-05-24 ENCOUNTER — Encounter (HOSPITAL_COMMUNITY): Payer: Self-pay

## 2017-05-24 LAB — BASIC METABOLIC PANEL
ANION GAP: 9 (ref 5–15)
BUN: 5 mg/dL — ABNORMAL LOW (ref 6–20)
CALCIUM: 8.5 mg/dL — AB (ref 8.9–10.3)
CO2: 24 mmol/L (ref 22–32)
CREATININE: 0.39 mg/dL — AB (ref 0.44–1.00)
Chloride: 104 mmol/L (ref 101–111)
GFR calc non Af Amer: >60 mL/min (ref >60)
GLUCOSE: 99 mg/dL (ref 65–99)
Potassium: 3.3 mmol/L — ABNORMAL LOW (ref 3.5–5.1)
Sodium: 137 mmol/L (ref 135–145)

## 2017-05-24 LAB — CBC WITH DIFFERENTIAL/PLATELET
BASOS ABS: 0 10*3/uL (ref 0.0–0.1)
BASOS PCT: 0 %
EOS ABS: 0.1 10*3/uL (ref 0.0–0.7)
Eosinophils Relative: 2 %
HEMATOCRIT: 33.3 % — AB (ref 36.0–46.0)
Hemoglobin: 11.6 g/dL — ABNORMAL LOW (ref 12.0–15.0)
Lymphocytes Relative: 19 %
Lymphs Abs: 1.2 10*3/uL (ref 0.7–4.0)
MCH: 31 pg (ref 26.0–34.0)
MCHC: 34.8 g/dL (ref 30.0–36.0)
MCV: 89 fL (ref 78.0–100.0)
MONO ABS: 0.7 10*3/uL (ref 0.1–1.0)
MONOS PCT: 11 %
NEUTROS ABS: 4.4 10*3/uL (ref 1.7–7.7)
NEUTROS PCT: 68 %
Platelets: 155 10*3/uL (ref 150–400)
RBC: 3.74 MIL/uL — ABNORMAL LOW (ref 3.87–5.11)
RDW: 12.6 % (ref 11.5–15.5)
WBC: 6.4 10*3/uL (ref 4.0–10.5)

## 2017-05-24 MED ORDER — HYDROCODONE-HOMATROPINE 5-1.5 MG/5ML PO SYRP
5.0000 mL | ORAL_SOLUTION | Freq: Four times a day (QID) | ORAL | Status: DC | PRN
  Administered 2017-05-24 – 2017-05-25 (×3): 5 mL via ORAL
  Filled 2017-05-24 (×3): qty 5

## 2017-05-24 MED ORDER — IPRATROPIUM-ALBUTEROL 0.5-2.5 (3) MG/3ML IN SOLN
3.0000 mL | Freq: Three times a day (TID) | RESPIRATORY_TRACT | Status: DC
  Administered 2017-05-24 – 2017-05-25 (×2): 3 mL via RESPIRATORY_TRACT
  Filled 2017-05-24 (×2): qty 3

## 2017-05-24 MED ORDER — GUAIFENESIN-DM 100-10 MG/5ML PO SYRP
10.0000 mL | ORAL_SOLUTION | Freq: Three times a day (TID) | ORAL | Status: DC | PRN
Start: 2017-05-24 — End: 2017-05-24
  Administered 2017-05-24: 10 mL via ORAL
  Filled 2017-05-24: qty 10

## 2017-05-24 MED ORDER — IPRATROPIUM-ALBUTEROL 0.5-2.5 (3) MG/3ML IN SOLN: 3.0000 mL | Freq: Three times a day (TID) | RESPIRATORY_TRACT | Status: DC

## 2017-05-24 MED ORDER — METHYLPREDNISOLONE SODIUM SUCC 125 MG IJ SOLR
60.0000 mg | INTRAMUSCULAR | Status: DC
  Administered 2017-05-24 – 2017-05-26 (×3): 60 mg via INTRAVENOUS
  Filled 2017-05-24 (×3): qty 2

## 2017-05-24 MED ORDER — POTASSIUM CHLORIDE CRYS ER 20 MEQ PO TBCR
40.0000 meq | EXTENDED_RELEASE_TABLET | Freq: Once | ORAL | Status: AC
  Administered 2017-05-24: 40 meq via ORAL
  Filled 2017-05-24: qty 2

## 2017-05-24 MED ORDER — SODIUM CHLORIDE 0.9 % IV SOLN
3.0000 g | Freq: Four times a day (QID) | INTRAVENOUS | Status: DC
  Administered 2017-05-24 – 2017-05-25 (×4): 3 g via INTRAVENOUS
  Filled 2017-05-24 (×5): qty 3

## 2017-05-24 NOTE — Progress Notes (Signed)
PROGRESS NOTE  Sylvia Maxwell ANV:[id number] DOB: 16-Mar-1986 DOA: 05/20/2017 PCP: Patient, No Pcp Per  HPI/Recap of past 24 hours:   still has significant cough , no productive, no fever   Assessment/Plan: Principal Problem:   Major depressive disorder, single episode, severe ([place]) Active Problems:   Heroin overdose ([place])   Polysubstance (including opioids) dependence with physiol dependence ([place])  Acute hypoxic respiratory failure/aspiration pneumonia in the setting of polysubstance abuse including  Heroin and Methamphetamine/cocaine/benso etc (please see uds result), alcohol level 258 on presentation -she arrived from home via EMS, she is unresponsive upon arrival, she is intubated in the ED, she is admitted to icu on 2/7 -she is extubated on 2/8 and transferred to hospitalist service on 2/9 -continue unasyn,  mucinex and nebs. Although labs are improving, she continue to have significant cough, mostly nonproductive, repeat ct chest "Severe multilobar bronchopneumonia throughout all aspects of the lungs bilaterally' -will add steroids, increase nebs, continue ivf, mucinex, add hycodan for significant cough -wean oxygen as tolerated.  Elevated lft:  From infection/alcohol?  hepatitis panel in process and hiv negative lft improving   Thrombocytopenia:  from acute illness/alcohol? She also has abnormal liver function plt nadir at 92, has normalized, start lovenox prophylaxis  Anemia: normocytic hgb 11, no bleeding Monitor  Hypokalemia/hypomagnesemia: replace k/mag,  k remain low, continue to replace, repeat in am  Central chest pain, possible esophagitis/gastritis: in the setting of alcohol  Gi cocktail and continue  ppi  Alcohol abuse:  presented with alcohol intoxication H/o alcohol withdrawal seizure,  start on ciwa, so far, no significant withdrawal symptoms  Heroin use:  Start on clonidine detox protocol She reports body aches/headache.  Drug  overdose/depression:  She denies SI/HI to me Psychiatry consulted recommend inpatient psych placement once medically stable  Code Status: full  Family Communication: patient   Disposition Plan: d/c tele, ambulate, wean oxygen,  hopefully to [facility] on [facility]/12 once cough improves, likely will need a short course of steroids   Consultants:  Critical care  psychiatry  Procedures:  Intubation and extubation  Antibiotics:  unasyn   Objective: BP 111/72 (BP Location: Left Arm)   Pulse 86   Temp 98.3 F (36.8 C) (Oral)   Resp 20   Ht '5\' 4"'  (1.626 m)   Wt 72.2 kg (159 lb 2.8 oz)   LMP  (LMP Unknown)   SpO2 97%   BMI 27.32 kg/m   Intake/Output Summary (Last 24 hours) at 05/24/2017 1417 Last data filed at 05/24/2017 0900 Gross per 24 hour  Intake 2200 ml  Output 400 ml  Net 1800 ml   Filed Weights   05/22/17 0442 05/23/17 0430 05/24/17 0530  Weight: 73 kg (160 lb 15 oz) 71.6 kg (157 lb 13.6 oz) 72.2 kg (159 lb 2.8 oz)    Exam: Patient is examined daily including today on 05/24/2017, exams remain the same as of yesterday except that has changed    General:  Weak, NAD  Cardiovascular: RRR  Respiratory: lung exam has much improved, improved aeration, less rhonchi, wheezing has resolved  Abdomen: Soft/ND/NT, positive BS  Musculoskeletal: No Edema  Neuro: alert, oriented , depressed mood, flat affect  Data Reviewed: Basic Metabolic Panel: Recent Labs  Lab 05/20/17 0241 05/21/17 0257 05/22/17 0327 05/23/17 0244 05/24/17 0720  NA 142 137 135 138 137  K 4.0 3.5 3.2* 3.0* 3.3*  CL 105 104 101 101 104  CO2 '22 28 27 28 24  ' GLUCOSE 123* 99 100* 116* 99  BUN 10 6 <5* 5* 5*  CREATININE 0.66 0.42* 0.42* 0.46 0.39*  CALCIUM 7.9* 7.2* 8.3* 8.5* 8.5*  MG  --  1.1*  --  1.9  --   PHOS  --  2.6  --   --   --    Liver Function Tests: Recent Labs  Lab 05/20/17 0241 05/21/17 0257 05/23/17 0244  AST 149* 65* 23  ALT 215* 123* 65*  ALKPHOS 75 51 64  BILITOT 0.5 0.6  0.6  PROT 6.7 5.2* 5.9*  ALBUMIN 3.7 2.7* 2.8*   No results for input(s): LIPASE, AMYLASE in the last 168 hours. No results for input(s): AMMONIA in the last 168 hours. CBC: Recent Labs  Lab 05/20/17 0241 05/21/17 0257 05/22/17 0327 05/23/17 0244 05/24/17 0720  WBC 10.2 10.7* 15.5* 12.2* 6.4  NEUTROABS 8.0*  --   --  10.5* 4.4  HGB 13.4 11.4* 11.6* 11.2* 11.6*  HCT 39.4 34.0* 33.5* 32.1* 33.3*  MCV 90.2 91.6 89.8 89.4 89.0  PLT 137* 98* 92* 110* 155   Cardiac Enzymes:   No results for input(s): CKTOTAL, CKMB, CKMBINDEX, TROPONINI in the last 168 hours. BNP (last 3 results) No results for input(s): BNP in the last 8760 hours.  ProBNP (last 3 results) No results for input(s): PROBNP in the last 8760 hours.  CBG: Recent Labs  Lab 05/20/17 0245  GLUCAP 121*    Recent Results (from the past 240 hour(s))  Culture, respiratory (NON-Expectorated)     Status: None   Collection Time: 05/20/17 12:01 PM  Result Value Ref Range Status   Specimen Description   Final    TRACHEAL ASPIRATE Performed at [facility] [address] [place], [place] [address]    Special Requests   Final    NONE Performed at [facility], [place] [address], [place], [place] [address]    Gram Stain NO WBC SEEN NO ORGANISMS SEEN   Final   Culture   Final    Consistent with normal respiratory flora. Performed at [facility], [place] [address], [place], [place] [address]    Report Status 05/22/2017 FINAL  Final  MRSA PCR Screening     Status: Abnormal   Collection Time: 05/20/17  4:46 PM  Result Value Ref Range Status   MRSA by PCR POSITIVE (A) NEGATIVE Final    Comment:        The GeneXpert MRSA Assay (FDA approved for NASAL specimens only), is one component of a comprehensive MRSA colonization surveillance program. It is not intended to diagnose MRSA infection nor to guide or monitor treatment for MRSA infections. CRITICAL RESULT CALLED TO, READ  BACK BY AND VERIFIED WITH: NOTIFIED R.DAVIS RN. 05/22/2017 1931 JR Performed at [facility], [place] [address] [place], [place] [address]      Studies: Dg Chest 2 View  Result Date: 05/24/2017 CLINICAL DATA:  Cough and shortness of breath. Polysubstance abuse, depression, current smoker. EXAM: CHEST  2 VIEW COMPARISON:  Portable chest x-ray of May 22, 2017 FINDINGS: The lungs are well-expanded. The interstitial markings are coarse. There are patchy densities inferiorly in the right upper lobe, at the right lung base, and at the left lung base. The heart is normal in size. The pulmonary vascularity is not engorged. There is no significant pleural effusion. IMPRESSION: Patchy interstitial and alveolar opacities bilaterally worrisome for pneumonia. The appearance of the chest is slightly improved overall as compared to the previous study. Electronically Signed  By: David  Martinique M.D.   On: 05/24/2017 13:00    Scheduled Meds: . chlorhexidine gluconate (MEDLINE KIT)  15 mL Mouth Rinse BID  . Chlorhexidine Gluconate Cloth  6 each Topical Q0600  . enoxaparin (LOVENOX) injection  40 mg Subcutaneous Q24H  . feeding supplement (ENSURE ENLIVE)  237 mL Oral BID BM  . folic acid  1 mg Oral Daily  . gabapentin  200 mg Oral BID  . guaiFENesin  600 mg Oral BID  . ipratropium-albuterol  3 mL Nebulization BID  . mouth rinse  15 mL Mouth Rinse QID  . multivitamin with minerals  1 tablet Oral Daily  . mupirocin ointment  1 application Nasal BID  . pantoprazole  20 mg Oral Daily  . thiamine  100 mg Oral Daily   Or  . thiamine  100 mg Intravenous Daily    Continuous Infusions: . sodium chloride    . ampicillin-sulbactam (UNASYN) IV 3 g (05/24/17 1343)  . lactated ringers 125 mL/hr at 05/24/17 0944  . propofol (DIPRIVAN) infusion Stopped (05/21/17 0845)     Time spent: 35 mins I have personally reviewed and interpreted on  05/24/2017 daily labs, tele strips, imagings as  discussed above under date review session and assessment and plans.  I reviewed all nursing notes, pharmacy notes, consultant notes,  vitals, pertinent old records  I have discussed plan of care as described above with RN , patient  on 05/24/2017   Florencia Reasons MD, PhD  [facility] Pager [phone]. If 7PM-7AM, please contact night-coverage at [email or web address], password [facility] 05/24/2017, 2:17 PM  LOS: 4 days

## 2017-05-24 NOTE — Evaluation (Signed)
Physical Therapy One Time Evaluation Patient Details Name: Sylvia Maxwell MRN: [id number] DOB: 09-Aug-1985 Today's Date: 05/24/2017   History of Present Illness  Patient with history of Polysubstance dependence who was brought to the hospital after she overdosed on Heroin and methamphetamine.  Pt admitted with Acute hypoxic and acute hypercapneic respiratory failure; Aspiration; requiring ETT 2/7-2/8  Clinical Impression  Patient evaluated by Physical Therapy with no further acute PT needs identified. All education has been completed and the patient has no further questions.  Pt ambulated in hallway without difficulty, only reporting minimal dyspnea however SpO2 91% room air upon returning to room. See below for any follow-up Physical Therapy or equipment needs. PT is signing off. Thank you for this referral.     Follow Up Recommendations No PT follow up    Equipment Recommendations  None recommended by PT    Recommendations for Other Services       Precautions / Restrictions Precautions Precaution Comments: safety sitter      Mobility  Bed Mobility Overal bed mobility: Modified Independent                Transfers Overall transfer level: Modified independent                  Ambulation/Gait Ambulation/Gait assistance: Supervision;Modified independent (Device/Increase time) Ambulation Distance (Feet): 180 Feet Assistive device: None Gait Pattern/deviations: WFL(Within Functional Limits)     General Gait Details: required short rest break halfway for dyspnea, Spo2 remained 95% on room air, once back to room lowest SPo2 reading 91-92% on room air and quickly improved once pt sitting on bed  Stairs            Wheelchair Mobility    Modified Rankin (Stroke Patients Only)       Balance Overall balance assessment: No apparent balance deficits (not formally assessed)                                           Pertinent  Vitals/Pain Pain Assessment: (only c/o coughing)    Home Living                   Additional Comments: lives with her boyfriend per chart review; did not obtain PLOF as pt mobilizing modified independently for evaluation, also plans per chart for inpatient psych    Prior Function Level of Independence: Independent               Hand Dominance        Extremity/Trunk Assessment   Upper Extremity Assessment Upper Extremity Assessment: Overall WFL for tasks assessed    Lower Extremity Assessment Lower Extremity Assessment: Overall WFL for tasks assessed    Cervical / Trunk Assessment Cervical / Trunk Assessment: Normal  Communication   Communication: No difficulties  Cognition Arousal/Alertness: Awake/alert Behavior During Therapy: Flat affect Overall Cognitive Status: Within Functional Limits for tasks assessed                                        General Comments      Exercises     Assessment/Plan    PT Assessment Patent does not need any further PT services  PT Problem List         PT Treatment Interventions  PT Goals (Current goals can be found in the Care Plan section)  Acute Rehab PT Goals PT Goal Formulation: All assessment and education complete, DC therapy    Frequency     Barriers to discharge        Co-evaluation               AM-PAC PT "6 Clicks" Daily Activity  Outcome Measure Difficulty turning over in bed (including adjusting bedclothes, sheets and blankets)?: None Difficulty moving from lying on back to sitting on the side of the bed? : None Difficulty sitting down on and standing up from a chair with arms (e.g., wheelchair, bedside commode, etc,.)?: None Help needed moving to and from a bed to chair (including a wheelchair)?: None Help needed walking in hospital room?: A Little Help needed climbing 3-5 steps with a railing? : A Little 6 Click Score: 22    End of Session   Activity Tolerance:  Patient tolerated treatment well Patient left: in bed;with call bell/phone within reach;with nursing/sitter in room Nurse Communication: Mobility status PT Visit Diagnosis: Difficulty in walking, not elsewhere classified (R26.2)    Time: 8341-9622 PT Time Calculation (min) (ACUTE ONLY): 11 min   Charges:   PT Evaluation $PT Eval Low Complexity: 1 Low     PT G CodesCarmelia Bake, PT, DPT 05/24/2017 Pager: [phone]  York Ram E 05/24/2017, 3:22 PM

## 2017-05-24 NOTE — Progress Notes (Signed)
SATURATION QUALIFICATIONS: (This note is used to comply with regulatory documentation for home oxygen)  Patient Saturations on Room Air at Rest = 97%  Patient Saturations on Room Air while Ambulating = 91%   

## 2017-05-24 NOTE — Progress Notes (Signed)
LCSW following for inpatient psych placement.  Patient not medically stable today. Anticipate dc tomorrow.   LCSW will continue to follow.   Carolin Coy Roscoe Long [place]

## 2017-05-24 NOTE — Clinical Social Work Note (Signed)
Clinical Social Work Assessment  Patient Details  Name: Sylvia Maxwell MRN: [id number] Date of Birth: 08/10/1985  Date of referral:  05/24/17               Reason for consult:  Facility Placement                Permission sought to share information with:  Case Manager, Customer service manager, Psychiatrist Permission granted to share information::  Yes, Verbal Permission Granted  Name::        Agency::     Relationship::     Contact Information:     Housing/Transportation Living arrangements for the past 2 months:  Apartment Source of Information:  Patient Patient Interpreter Needed:  None, [place] Criminal Activity/Legal Involvement Pertinent to Current Situation/Hospitalization:  Yes Significant Relationships:  Significant Other, Pets Lives with:  Significant Other, Pets Do you feel safe going back to the place where you live?  Yes Need for family participation in patient care:  Yes (Comment)  Care giving concerns:  No care giving concerns at the time of assessment.    Social Worker assessment / plan:  LCSW following for inpatient psych placement.  LCSW met at bedside with patient. Patient has sitter for safety. No family present.   Patient reports that she is confused about rec for inpatient psych. Patient reports that she is not suicide and was not suicidal at the time of admission. Patient reports that she had a bad night.   According to H&P patient was admitted for intentional OD.   Patient reports that she lives with her boyfriend and dog. Patient reports that her boyfriend is out of town and her dog is home alone. She stated that her boyfriend asked a friend to look after the dog.   Patient reports that she has never been inpatient psych and is not followed in the community. Patient reports that she is not on any meds currently. Patient is willing to go inpatient, but would prefer outpatient.   PLAN: Patient will go inpatient psych when medically  stable.    Employment status:  Unemployed Forensic scientist:  Self Pay (Medicaid Pending) PT Recommendations:  Not assessed at this time Information / Referral to community resources:  Inpatient Psychiatric Care (Comment Required)  Patient/Family's Response to care:  Patient appears to be frustrated with current rec for inpatient psych. LCSW explained process and voluntary vs. IVC. Patient reports understanding of options.   Patient/Family's Understanding of and Emotional Response to Diagnosis, Current Treatment, and Prognosis:  Patient is understanding of diagnosis and agreeable to treatment plan.   Emotional Assessment Appearance:  Appears stated age Attitude/Demeanor/Rapport:    Affect (typically observed):  Overwhelmed, Frustrated Orientation:  Oriented to Self, Oriented to Place, Oriented to  Time, Oriented to Situation Alcohol / Substance use:  Illicit Drugs Psych involvement (Current and /or in the community):  No (Comment)  Discharge Needs  Concerns to be addressed:  Financial / Insurance Concerns, Lack of Support Readmission within the last 30 days:  No Current discharge risk:  None Barriers to Discharge:  Inadequate or no insurance, Psych Bed not available   Servando Snare, LCSW 05/24/2017, 11:55 AM

## 2017-05-25 LAB — HEPATITIS PANEL, ACUTE
HCV Ab: <0.1 s/co ratio (ref 0.0–0.9)
HEP A IGM: NEGATIVE
HEP B C IGM: NEGATIVE
HEP B S AG: NEGATIVE

## 2017-05-25 LAB — COMPREHENSIVE METABOLIC PANEL
ALBUMIN: 2.8 g/dL — AB (ref 3.5–5.0)
ALK PHOS: 57 U/L (ref 38–126)
ALT: 33 U/L (ref 14–54)
ANION GAP: 12 (ref 5–15)
AST: 16 U/L (ref 15–41)
BUN: 7 mg/dL (ref 6–20)
CO2: 22 mmol/L (ref 22–32)
Calcium: 8.6 mg/dL — ABNORMAL LOW (ref 8.9–10.3)
Chloride: 103 mmol/L (ref 101–111)
Creatinine, Ser: 0.4 mg/dL — ABNORMAL LOW (ref 0.44–1.00)
GFR calc Af Amer: >60 mL/min (ref >60)
GFR calc non Af Amer: >60 mL/min (ref >60)
GLUCOSE: 120 mg/dL — AB (ref 65–99)
POTASSIUM: 3.5 mmol/L (ref 3.5–5.1)
SODIUM: 137 mmol/L (ref 135–145)
Total Bilirubin: 0.4 mg/dL (ref 0.3–1.2)
Total Protein: 6.1 g/dL — ABNORMAL LOW (ref 6.5–8.1)

## 2017-05-25 LAB — CBC WITH DIFFERENTIAL/PLATELET
BASOS ABS: 0 10*3/uL (ref 0.0–0.1)
BASOS PCT: 0 %
EOS ABS: 0 10*3/uL (ref 0.0–0.7)
Eosinophils Relative: 0 %
HCT: 29.9 % — ABNORMAL LOW (ref 36.0–46.0)
HEMOGLOBIN: 10.6 g/dL — AB (ref 12.0–15.0)
Lymphocytes Relative: 34 %
Lymphs Abs: 1.6 10*3/uL (ref 0.7–4.0)
MCH: 31.1 pg (ref 26.0–34.0)
MCHC: 35.5 g/dL (ref 30.0–36.0)
MCV: 87.7 fL (ref 78.0–100.0)
Monocytes Absolute: 0.6 10*3/uL (ref 0.1–1.0)
Monocytes Relative: 12 %
NEUTROS ABS: 2.7 10*3/uL (ref 1.7–7.7)
NEUTROS PCT: 54 %
Platelets: 210 10*3/uL (ref 150–400)
RBC: 3.41 MIL/uL — ABNORMAL LOW (ref 3.87–5.11)
RDW: 12.5 % (ref 11.5–15.5)
WBC: 4.9 10*3/uL (ref 4.0–10.5)

## 2017-05-25 LAB — MAGNESIUM: Magnesium: 1.6 mg/dL — ABNORMAL LOW (ref 1.7–2.4)

## 2017-05-25 MED ORDER — MAGNESIUM SULFATE 4 GM/100ML IV SOLN
4.0000 g | Freq: Once | INTRAVENOUS | Status: AC
  Administered 2017-05-25: 4 g via INTRAVENOUS
  Filled 2017-05-25: qty 100

## 2017-05-25 MED ORDER — BENZONATATE 100 MG PO CAPS
100.0000 mg | ORAL_CAPSULE | Freq: Three times a day (TID) | ORAL | Status: DC | PRN
  Administered 2017-05-25 – 2017-05-27 (×7): 100 mg via ORAL
  Filled 2017-05-25 (×7): qty 1

## 2017-05-25 MED ORDER — ACETAMINOPHEN 325 MG PO TABS
650.0000 mg | ORAL_TABLET | Freq: Four times a day (QID) | ORAL | Status: DC | PRN
Start: 2017-05-25 — End: 2017-05-27
  Administered 2017-05-25 – 2017-05-27 (×4): 650 mg via ORAL
  Filled 2017-05-25 (×4): qty 2

## 2017-05-25 MED ORDER — POTASSIUM CHLORIDE CRYS ER 20 MEQ PO TBCR
40.0000 meq | EXTENDED_RELEASE_TABLET | Freq: Once | ORAL | Status: AC
  Administered 2017-05-25: 40 meq via ORAL
  Filled 2017-05-25: qty 2

## 2017-05-25 MED ORDER — HYDROCODONE-HOMATROPINE 5-1.5 MG/5ML PO SYRP
5.0000 mL | ORAL_SOLUTION | Freq: Four times a day (QID) | ORAL | Status: DC | PRN
  Administered 2017-05-25 – 2017-05-27 (×7): 5 mL via ORAL
  Filled 2017-05-25 (×7): qty 5

## 2017-05-25 MED ORDER — ADULT MULTIVITAMIN W/MINERALS CH
1.0000 | ORAL_TABLET | Freq: Every day | ORAL | Status: DC
  Administered 2017-05-26 – 2017-05-27 (×2): 1 via ORAL
  Filled 2017-05-25: qty 1

## 2017-05-25 MED ORDER — AMOXICILLIN-POT CLAVULANATE 875-125 MG PO TABS
1.0000 | ORAL_TABLET | Freq: Two times a day (BID) | ORAL | Status: DC
  Administered 2017-05-25 – 2017-05-27 (×5): 1 via ORAL
  Filled 2017-05-25 (×5): qty 1

## 2017-05-25 MED ORDER — DOXYCYCLINE HYCLATE 100 MG PO TABS
100.0000 mg | ORAL_TABLET | Freq: Two times a day (BID) | ORAL | Status: DC
  Administered 2017-05-25 – 2017-05-27 (×5): 100 mg via ORAL
  Filled 2017-05-25 (×5): qty 1

## 2017-05-25 MED ORDER — ACETAMINOPHEN 325 MG PO TABS: 650.0000 mg | ORAL_TABLET | ORAL | Status: DC | PRN

## 2017-05-25 MED ORDER — IPRATROPIUM-ALBUTEROL 0.5-2.5 (3) MG/3ML IN SOLN
3.0000 mL | Freq: Two times a day (BID) | RESPIRATORY_TRACT | Status: DC
  Administered 2017-05-25: 3 mL via RESPIRATORY_TRACT
  Filled 2017-05-25: qty 3

## 2017-05-25 NOTE — Progress Notes (Signed)
LCSW following for inpatient psych placement.   Patient not medically stable for transfer today.   LCSW will continue to follow.   Sylvia Maxwell

## 2017-05-25 NOTE — Progress Notes (Signed)
PROGRESS NOTE  Sylvia Maxwell ALP:[id number] DOB: May 19, 1985 DOA: 05/20/2017 PCP: Patient, No Pcp Per  HPI/Recap of past 24 hours:   feeling a little better, still has significant cough , still non productive,  tmax 100 last night  Suicide sitter at bedside  Assessment/Plan: Principal Problem:   Major depressive disorder, single episode, severe ([place]) Active Problems:   Heroin overdose ([place])   Polysubstance (including opioids) dependence with physiol dependence ([place])  Acute hypoxic respiratory failure/aspiration pneumonia in the setting of polysubstance abuse including  Heroin and Methamphetamine/cocaine/benso etc (please see uds result), alcohol level 258 on presentation -she arrived from home via EMS, she is unresponsive upon arrival, she is intubated in the ED, she is admitted to icu on 2/7 -she is extubated on 2/8 and transferred to hospitalist service on 2/9 - Although labs are improving, she continues to have significant cough, mostly nonproductive, repeat ct chest "Severe multilobar bronchopneumonia throughout all aspects of the lungs bilaterally' -she is stared on steroids on 2/11, she has been on unasyn since admission, change abx to augmentin and add doxycycline to cover mrsa and atypical, continue  mucinex and nebs.  Continue mucinex, add hycodan for significant cough -d/c ivf,  -wean oxygen as tolerated.  Elevated lft:  -From infection/alcohol? -hepatitis panel negative and hiv negative lft improving   Thrombocytopenia:  from acute illness/alcohol? She also has abnormal liver function plt nadir at 92, has normalized, start lovenox prophylaxis  Anemia: normocytic hgb 11, no bleeding Monitor  Hypokalemia/hypomagnesemia: replace k/mag,  k and mag  remain low, continue to replace, repeat in am  Central chest pain, possible esophagitis/gastritis: in the setting of alcohol  Gi cocktail and continue  ppi improving  Alcohol abuse:  presented with alcohol  intoxication H/o alcohol withdrawal seizure,  start on ciwa, so far, no significant withdrawal symptoms  Heroin use:  Start on clonidine detox protocol She reports body aches/headache.  Drug overdose/depression:  She denies SI/HI to me Psychiatry consulted recommend inpatient psych placement once medically stable  Code Status: full  Family Communication: patient   Disposition Plan: ambulate,  hopefully to [facility] on 2/13 once cough improves, likely will need a short course of steroids   Consultants:  Critical care  psychiatry  Procedures:  Intubation and extubation  Antibiotics:  unasyn from admission to 2/12  Augmentin/doxycycline from 2/12   Objective: BP 128/86 (BP Location: Left Arm)   Pulse 70   Temp 99.2 F (37.3 C) (Oral)   Resp 20   Ht '5\' 4"'  (1.626 m)   Wt 69.2 kg (152 lb 8.9 oz)   LMP 05/10/2017 (Approximate)   SpO2 95%   BMI 26.19 kg/m   Intake/Output Summary (Last 24 hours) at 05/25/2017 1832 Last data filed at 05/25/2017 1354 Gross per 24 hour  Intake 2770 ml  Output -  Net 2770 ml   Filed Weights   05/23/17 0430 05/24/17 0530 05/25/17 0410  Weight: 71.6 kg (157 lb 13.6 oz) 72.2 kg (159 lb 2.8 oz) 69.2 kg (152 lb 8.9 oz)    Exam: Patient is examined daily including today on 05/25/2017, exams remain the same as of yesterday except that has changed    General:  Weak, NAD  Cardiovascular: RRR  Respiratory: lung exam has much improved, improved aeration, less rhonchi, wheezing has resolved  Abdomen: Soft/ND/NT, positive BS  Musculoskeletal: No Edema  Neuro: alert, oriented , depressed mood, flat affect  Data Reviewed: Basic Metabolic Panel: Recent Labs  Lab 05/21/17 0257 05/22/17 0327 05/23/17  0244 05/24/17 0720 05/25/17 0521  NA 137 135 138 137 137  K 3.5 3.2* 3.0* 3.3* 3.5  CL 104 101 101 104 103  CO2 '28 27 28 24 22  ' GLUCOSE 99 100* 116* 99 120*  BUN 6 <5* 5* 5* 7  CREATININE 0.42* 0.42* 0.46 0.39* 0.40*  CALCIUM 7.2*  8.3* 8.5* 8.5* 8.6*  MG 1.1*  --  1.9  --  1.6*  PHOS 2.6  --   --   --   --    Liver Function Tests: Recent Labs  Lab 05/20/17 0241 05/21/17 0257 05/23/17 0244 05/25/17 0521  AST 149* 65* 23 16  ALT 215* 123* 65* 33  ALKPHOS 75 51 64 57  BILITOT 0.5 0.6 0.6 0.4  PROT 6.7 5.2* 5.9* 6.1*  ALBUMIN 3.7 2.7* 2.8* 2.8*   No results for input(s): LIPASE, AMYLASE in the last 168 hours. No results for input(s): AMMONIA in the last 168 hours. CBC: Recent Labs  Lab 05/20/17 0241 05/21/17 0257 05/22/17 0327 05/23/17 0244 05/24/17 0720 05/25/17 0521  WBC 10.2 10.7* 15.5* 12.2* 6.4 4.9  NEUTROABS 8.0*  --   --  10.5* 4.4 2.7  HGB 13.4 11.4* 11.6* 11.2* 11.6* 10.6*  HCT 39.4 34.0* 33.5* 32.1* 33.3* 29.9*  MCV 90.2 91.6 89.8 89.4 89.0 87.7  PLT 137* 98* 92* 110* 155 210   Cardiac Enzymes:   No results for input(s): CKTOTAL, CKMB, CKMBINDEX, TROPONINI in the last 168 hours. BNP (last 3 results) No results for input(s): BNP in the last 8760 hours.  ProBNP (last 3 results) No results for input(s): PROBNP in the last 8760 hours.  CBG: Recent Labs  Lab 05/20/17 0245  GLUCAP 121*    Recent Results (from the past 240 hour(s))  Culture, respiratory (NON-Expectorated)     Status: None   Collection Time: 05/20/17 12:01 PM  Result Value Ref Range Status   Specimen Description   Final    TRACHEAL ASPIRATE Performed at [place] [address] [place], [place] [address]    Special Requests   Final    NONE Performed at [facility], [place] [address] [place], [place] [address]    Gram Stain NO WBC SEEN NO ORGANISMS SEEN   Final   Culture   Final    Consistent with normal respiratory flora. Performed at [facility], [place] [address] [place], [place] [address]    Report Status 05/22/2017 FINAL  Final  MRSA PCR Screening     Status: Abnormal   Collection Time: 05/20/17  4:46 PM  Result Value Ref Range Status   MRSA by PCR  POSITIVE (A) NEGATIVE Final    Comment:        The GeneXpert MRSA Assay (FDA approved for NASAL specimens only), is one component of a comprehensive MRSA colonization surveillance program. It is not intended to diagnose MRSA infection nor to guide or monitor treatment for MRSA infections. CRITICAL RESULT CALLED TO, READ BACK BY AND VERIFIED WITH: NOTIFIED R.DAVIS RN. 05/22/2017 1931 JR Performed at [facility], [place] [address] [place], [place] [address]      Studies: No results found.  Scheduled Meds: . amoxicillin-clavulanate  1 tablet Oral Q12H  . chlorhexidine gluconate (MEDLINE KIT)  15 mL Mouth Rinse BID  . Chlorhexidine Gluconate Cloth  6 each Topical Q0600  . doxycycline  100 mg Oral BID  . enoxaparin (LOVENOX) injection  40 mg Subcutaneous Q24H  . feeding supplement (ENSURE  ENLIVE)  237 mL Oral BID BM  . folic acid  1 mg Oral Daily  . gabapentin  200 mg Oral BID  . guaiFENesin  600 mg Oral BID  . ipratropium-albuterol  3 mL Nebulization BID  . mouth rinse  15 mL Mouth Rinse QID  . methylPREDNISolone (SOLU-MEDROL) injection  60 mg Intravenous Q24H  . [START ON 05/26/2017] multivitamin with minerals  1 tablet Oral Q lunch  . pantoprazole  20 mg Oral Daily  . thiamine  100 mg Oral Daily   Or  . thiamine  100 mg Intravenous Daily    Continuous Infusions: . sodium chloride    . propofol (DIPRIVAN) infusion Stopped (05/21/17 0845)     Time spent: 25 mins I have personally reviewed and interpreted on  05/25/2017 daily labs,  imagings as discussed above under date review session and assessment and plans.  I reviewed all nursing notes, pharmacy notes, consultant notes,  vitals, pertinent old records  I have discussed plan of care as described above with RN , patient  on 05/25/2017   Florencia Reasons MD, PhD  [facility] Pager [phone]. If 7PM-7AM, please contact night-coverage at [email or web address], password [facility] 05/25/2017, 6:32 PM  LOS: 5 days

## 2017-05-25 NOTE — Progress Notes (Signed)
Encouraged patient to ambulate/shower/mobilize.  Patient has refused today.  Will continue to monitor.

## 2017-05-26 LAB — CBC WITH DIFFERENTIAL/PLATELET
BASOS ABS: 0 10*3/uL (ref 0.0–0.1)
Basophils Relative: 0 %
EOS ABS: 0 10*3/uL (ref 0.0–0.7)
Eosinophils Relative: 0 %
HCT: 35.1 % — ABNORMAL LOW (ref 36.0–46.0)
HEMOGLOBIN: 12.3 g/dL (ref 12.0–15.0)
LYMPHS ABS: 1.3 10*3/uL (ref 0.7–4.0)
LYMPHS PCT: 16 %
MCH: 30.8 pg (ref 26.0–34.0)
MCHC: 35 g/dL (ref 30.0–36.0)
MCV: 87.8 fL (ref 78.0–100.0)
Monocytes Absolute: 0.4 10*3/uL (ref 0.1–1.0)
Monocytes Relative: 5 %
NEUTROS PCT: 79 %
Neutro Abs: 6 10*3/uL (ref 1.7–7.7)
PLATELETS: 270 10*3/uL (ref 150–400)
RBC: 4 MIL/uL (ref 3.87–5.11)
RDW: 12.7 % (ref 11.5–15.5)
WBC: 7.6 10*3/uL (ref 4.0–10.5)

## 2017-05-26 LAB — MAGNESIUM: MAGNESIUM: 2 mg/dL (ref 1.7–2.4)

## 2017-05-26 LAB — BASIC METABOLIC PANEL
ANION GAP: 10 (ref 5–15)
BUN: 8 mg/dL (ref 6–20)
CHLORIDE: 104 mmol/L (ref 101–111)
CO2: 20 mmol/L — ABNORMAL LOW (ref 22–32)
Calcium: 8.7 mg/dL — ABNORMAL LOW (ref 8.9–10.3)
Creatinine, Ser: 0.35 mg/dL — ABNORMAL LOW (ref 0.44–1.00)
GFR calc Af Amer: >60 mL/min (ref >60)
Glucose, Bld: 133 mg/dL — ABNORMAL HIGH (ref 65–99)
POTASSIUM: 3.9 mmol/L (ref 3.5–5.1)
SODIUM: 134 mmol/L — AB (ref 135–145)

## 2017-05-26 LAB — TRIGLYCERIDES: TRIGLYCERIDES: 64 mg/dL (ref <150)

## 2017-05-26 NOTE — Progress Notes (Addendum)
PROGRESS NOTE  Sylvia Maxwell KTG:[id number] DOB: 1986-02-12 DOA: 05/20/2017 PCP: Patient, No Pcp Per  HPI/Recap of past 24 hours:   feeling a little better,  still has significant cough , DOE,  No significant sign of withdrawal symptoms tmax 99.2 last night  Suicide sitter at bedside  Assessment/Plan: Principal Problem:   Major depressive disorder, single episode, severe ([place]) Active Problems:   Heroin overdose ([place])   Polysubstance (including opioids) dependence with physiol dependence ([place])  Acute hypoxic respiratory failure/aspiration pneumonia in the setting of polysubstance abuse including  Heroin and Methamphetamine/cocaine/benso etc ( Urine drug screen pan positive ), alcohol level 258 on presentation -she arrived from home via EMS, she is unresponsive upon arrival, she is intubated in the ED, she is admitted to icu on 2/7 -she is extubated on 2/8 and transferred to hospitalist service on 2/9 -she is started on neurontin bid for alcohol withdrawal concaine dependence. - Although labs are improving, she continues to have significant cough, mostly nonproductive, repeat ct chest "Severe multilobar bronchopneumonia throughout all aspects of the lungs bilaterally' -she remain has significant cough, she is started on steroids on 2/11, she has been on unasyn since admission, change abx to augmentin and add doxycycline to cover mrsa and atypical, continue  mucinex and nebs.  Continue mucinex, add hycodan for significant cough -still persistent cough, start chest physical therapy, encourage ambulation -wean oxygen as tolerated.  Elevated lft:  -From infection/alcohol? -hepatitis panel negative and hiv negative -lft normalized.   Thrombocytopenia:  from acute illness/alcohol? She also has abnormal liver function plt nadir at 92,  plt normalized, start lovenox prophylaxis  Anemia: normocytic hgb 11-12, no bleeding Monitor  Hypokalemia/hypomagnesemia: replace  k/mag,  k and mag  remain low, continue to replace, repeat in am  Central chest pain, possible esophagitis/gastritis: in the setting of alcohol  Gi cocktail and continue  ppi improving  Alcohol abuse:  presented with alcohol intoxication H/o alcohol withdrawal seizure,  start on ciwa,  so far, no significant withdrawal symptoms  Heroin use:  Start on clonidine detox protocol No significant withdrawal symptom  Drug overdose/depression:  Sitter at bedsdie Psychiatry consulted on 2/9 recommend inpatient psych placement once medically stable Patient wants to leave the hospital, psych reeval requested on 2/13.  Code Status: full  Family Communication: patient   Disposition Plan: ambulate,  hopefully to [facility] on 2/14 once cough improves, likely will need a short course of steroids   Consultants:  Critical care  psychiatry  Procedures:  Intubation and extubation  Antibiotics:  unasyn from admission to 2/12  Augmentin/doxycycline from 2/12   Objective: BP 102/64 (BP Location: Left Arm)   Pulse 69   Temp 98.4 F (36.9 C) (Oral)   Resp 19   Ht '5\' 4"'  (1.626 m)   Wt 69.2 kg (152 lb 8.9 oz)   LMP 05/10/2017 (Approximate)   SpO2 96%   BMI 26.19 kg/m  No intake or output data in the 24 hours ending 05/26/17 1511 Filed Weights   05/23/17 0430 05/24/17 0530 05/25/17 0410  Weight: 71.6 kg (157 lb 13.6 oz) 72.2 kg (159 lb 2.8 oz) 69.2 kg (152 lb 8.9 oz)    Exam: Patient is examined daily including today on 05/26/2017, exams remain the same as of yesterday except that has changed    General:  Weak, NAD  Cardiovascular: RRR  Respiratory: lung exam has much improved, improved aeration, less rhonchi, wheezing has resolved  Abdomen: Soft/ND/NT, positive BS  Musculoskeletal: No Edema  Neuro: alert, oriented , depressed mood, flat affect  Data Reviewed: Basic Metabolic Panel: Recent Labs  Lab 05/21/17 0257 05/22/17 0327 05/23/17 0244 05/24/17 0720  05/25/17 0521 05/26/17 0150  NA 137 135 138 137 137 134*  K 3.5 3.2* 3.0* 3.3* 3.5 3.9  CL 104 101 101 104 103 104  CO2 '28 27 28 24 22 ' 20*  GLUCOSE 99 100* 116* 99 120* 133*  BUN 6 <5* 5* 5* 7 8  CREATININE 0.42* 0.42* 0.46 0.39* 0.40* 0.35*  CALCIUM 7.2* 8.3* 8.5* 8.5* 8.6* 8.7*  MG 1.1*  --  1.9  --  1.6* 2.0  PHOS 2.6  --   --   --   --   --    Liver Function Tests: Recent Labs  Lab 05/20/17 0241 05/21/17 0257 05/23/17 0244 05/25/17 0521  AST 149* 65* 23 16  ALT 215* 123* 65* 33  ALKPHOS 75 51 64 57  BILITOT 0.5 0.6 0.6 0.4  PROT 6.7 5.2* 5.9* 6.1*  ALBUMIN 3.7 2.7* 2.8* 2.8*   No results for input(s): LIPASE, AMYLASE in the last 168 hours. No results for input(s): AMMONIA in the last 168 hours. CBC: Recent Labs  Lab 05/20/17 0241  05/22/17 0327 05/23/17 0244 05/24/17 0720 05/25/17 0521 05/26/17 0150  WBC 10.2   < > 15.5* 12.2* 6.4 4.9 7.6  NEUTROABS 8.0*  --   --  10.5* 4.4 2.7 6.0  HGB 13.4   < > 11.6* 11.2* 11.6* 10.6* 12.3  HCT 39.4   < > 33.5* 32.1* 33.3* 29.9* 35.1*  MCV 90.2   < > 89.8 89.4 89.0 87.7 87.8  PLT 137*   < > 92* 110* 155 210 270   < > = values in this interval not displayed.   Cardiac Enzymes:   No results for input(s): CKTOTAL, CKMB, CKMBINDEX, TROPONINI in the last 168 hours. BNP (last 3 results) No results for input(s): BNP in the last 8760 hours.  ProBNP (last 3 results) No results for input(s): PROBNP in the last 8760 hours.  CBG: Recent Labs  Lab 05/20/17 0245  GLUCAP 121*    Recent Results (from the past 240 hour(s))  Culture, respiratory (NON-Expectorated)     Status: None   Collection Time: 05/20/17 12:01 PM  Result Value Ref Range Status   Specimen Description   Final    TRACHEAL ASPIRATE Performed at [place] [address], [place], [place] [address]    Special Requests   Final    NONE Performed at [facility] [place], [place] [address], [place], [place] [address]    Gram  Stain NO WBC SEEN NO ORGANISMS SEEN   Final   Culture   Final    Consistent with normal respiratory flora. Performed at [facility], [place] [address], [place], [place] [address]    Report Status 05/22/2017 FINAL  Final  MRSA PCR Screening     Status: Abnormal   Collection Time: 05/20/17  4:46 PM  Result Value Ref Range Status   MRSA by PCR POSITIVE (A) NEGATIVE Final    Comment:        The GeneXpert MRSA Assay (FDA approved for NASAL specimens only), is one component of a comprehensive MRSA colonization surveillance program. It is not intended to diagnose MRSA infection nor to guide or monitor treatment for MRSA infections. CRITICAL RESULT CALLED TO, READ BACK BY AND VERIFIED WITH: NOTIFIED R.DAVIS RN. 05/22/2017 1931 JR Performed at [facility], [address] [place], [place] [address]      Studies: No results found.  Scheduled Meds: . amoxicillin-clavulanate  1 tablet Oral Q12H  . chlorhexidine gluconate (MEDLINE KIT)  15 mL Mouth Rinse BID  . doxycycline  100 mg Oral BID  . enoxaparin (LOVENOX) injection  40 mg Subcutaneous Q24H  . feeding supplement (ENSURE ENLIVE)  237 mL Oral BID BM  . folic acid  1 mg Oral Daily  . gabapentin  200 mg Oral BID  . guaiFENesin  600 mg Oral BID  . mouth rinse  15 mL Mouth Rinse QID  . methylPREDNISolone (SOLU-MEDROL) injection  60 mg Intravenous Q24H  . multivitamin with minerals  1 tablet Oral Q lunch  . pantoprazole  20 mg Oral Daily  . thiamine  100 mg Oral Daily   Or  . thiamine  100 mg Intravenous Daily    Continuous Infusions: . sodium chloride       Time spent: 25 mins I have personally reviewed and interpreted on  05/26/2017 daily labs,  imagings as discussed above under date review session and assessment and plans.  I reviewed all nursing notes, pharmacy notes, consultant notes,  vitals, pertinent old records  I have discussed plan of care as described above with RN , patient  on  05/26/2017   Florencia Reasons MD, PhD  [facility] Pager [phone]. If 7PM-7AM, please contact night-coverage at [email or web address], password [facility] 05/26/2017, 3:11 PM  LOS: 6 days

## 2017-05-27 DIAGNOSIS — Z720 Tobacco use: Secondary | ICD-10-CM

## 2017-05-27 DIAGNOSIS — T401X1D Poisoning by heroin, accidental (unintentional), subsequent encounter: Secondary | ICD-10-CM

## 2017-05-27 DIAGNOSIS — F1410 Cocaine abuse, uncomplicated: Secondary | ICD-10-CM

## 2017-05-27 DIAGNOSIS — F322 Major depressive disorder, single episode, severe without psychotic features: Secondary | ICD-10-CM

## 2017-05-27 DIAGNOSIS — T43621A Poisoning by amphetamines, accidental (unintentional), initial encounter: Secondary | ICD-10-CM

## 2017-05-27 DIAGNOSIS — Z79899 Other long term (current) drug therapy: Secondary | ICD-10-CM

## 2017-05-27 LAB — BASIC METABOLIC PANEL
Anion gap: 13 (ref 5–15)
BUN: 11 mg/dL (ref 6–20)
CO2: 21 mmol/L — AB (ref 22–32)
CREATININE: 0.37 mg/dL — AB (ref 0.44–1.00)
Calcium: 8.8 mg/dL — ABNORMAL LOW (ref 8.9–10.3)
Chloride: 103 mmol/L (ref 101–111)
GFR calc Af Amer: >60 mL/min (ref >60)
GFR calc non Af Amer: >60 mL/min (ref >60)
GLUCOSE: 115 mg/dL — AB (ref 65–99)
Potassium: 3.7 mmol/L (ref 3.5–5.1)
SODIUM: 137 mmol/L (ref 135–145)

## 2017-05-27 LAB — MAGNESIUM: MAGNESIUM: 1.8 mg/dL (ref 1.7–2.4)

## 2017-05-27 NOTE — Plan of Care (Signed)
Will cont to mon 
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
## 2017-05-27 NOTE — Discharge Summary (Signed)
Physician Discharge Summary  Sylvia Maxwell RSW:[id number] DOB: 16-Sep-1985 DOA: 05/20/2017  PCP: Patient, No Pcp Per  Admit date: 05/20/2017 Discharge date: 05/27/2017  Time spent: > 35 minutes  Recommendations for Outpatient Follow-up:  1. Please continue to encourage illicit drug use [id number]   Discharge Diagnoses:  Principal Problem:   Major depressive disorder, single episode, severe ([place]) Active Problems:   Heroin overdose ([place])   Polysubstance (including opioids) dependence with physiol dependence [facility])   Discharge Condition: stable  Diet recommendation: Regular diet  Filed Weights   05/24/17 0530 05/25/17 0410 05/27/17 0457  Weight: 72.2 kg (159 lb 2.8 oz) 69.2 kg (152 lb 8.9 oz) 67.1 kg (148 lb)    History of present illness:  32 y/o who presented to hospital after heroin OD and aspiration.  Hospital Course:  Heroin OD: pt required intubation - Pt has been in house > 7 days currently does not have any withdrawal symptoms on exam - d/c home after psychiatry evaluation cleared (reported by nursing: Sylvia Maxwell)  Aspiration pna - treated for 7 days with antibiotics no need for further antibiotic medications  Procedures:  None  Consultations:  psychiatry  Discharge Exam: Vitals:   05/26/17 2037 05/27/17 0502  BP: 117/83 106/70  Pulse: 85 71  Resp: 20 20  Temp: 98.9 F (37.2 C) 98.8 F (37.1 C)  SpO2: 95% 93%    General: Pt in nad, alert and awake Cardiovascular: rrr, no rubs Respiratory: no increased wob, no wheezes  Discharge Instructions   Discharge Instructions    Diet - low sodium heart healthy   Complete by:  As directed    Discharge instructions   Complete by:  As directed    Please abstain from any illegal drug use. Follow up with your primary care physician within the next 1 week or sooner should any new concerns arise.   Increase activity slowly   Complete by:  As directed      Allergies as of 05/27/2017      Reactions    Lactose Intolerance (gi) Diarrhea, Nausea And Vomiting   Depends on the type of dairy-based product consumed as to which reaction occurs      Medication List    STOP taking these medications   cephALEXin 500 MG capsule Commonly known as:  KEFLEX     TAKE these medications   gabapentin 300 MG capsule Commonly known as:  NEURONTIN Take 1 capsule (300 mg total) by mouth 3 (three) times daily.      Allergies  Allergen Reactions  . Lactose Intolerance (Gi) Diarrhea and Nausea And Vomiting    Depends on the type of dairy-based product consumed as to which reaction occurs      The results of significant diagnostics from this hospitalization (including imaging, microbiology, ancillary and laboratory) are listed below for reference.    Significant Diagnostic Studies: Dg Chest 2 View  Result Date: 05/24/2017 CLINICAL DATA:  Cough and shortness of breath. Polysubstance abuse, depression, current smoker. EXAM: CHEST  2 VIEW COMPARISON:  Portable chest x-ray of May 22, 2017 FINDINGS: The lungs are well-expanded. The interstitial markings are coarse. There are patchy densities inferiorly in the right upper lobe, at the right lung base, and at the left lung base. The heart is normal in size. The pulmonary vascularity is not engorged. There is no significant pleural effusion. IMPRESSION: Patchy interstitial and alveolar opacities bilaterally worrisome for pneumonia. The appearance of the chest is slightly improved overall as compared to the previous study.  Electronically Signed   By: David  Martinique M.D.   On: 05/24/2017 13:00   Dg Abdomen 1 View  Result Date: 05/20/2017 CLINICAL DATA:  Initial evaluation for OG tube placement EXAM: ABDOMEN - 1 VIEW COMPARISON:  None. FINDINGS: Enteric tube in place with tip in side hole overlying the partially visualized stomach, beyond the GE junction. Tip projects inferiorly. Visualized bowel gas pattern within normal limits. IMPRESSION: Tip and side hole of  enteric tube overlying the stomach, well beyond the GE junction. Electronically Signed   By: Jeannine Boga M.D.   On: 05/20/2017 02:48   Ct Chest Wo Contrast  Result Date: 05/24/2017 CLINICAL DATA:  33 year old female with history of persistent cough. Acute hypoxic respiratory failure. Probable aspiration pneumonia in the setting of polysubstance abuse. EXAM: CT CHEST WITHOUT CONTRAST TECHNIQUE: Multidetector CT imaging of the chest was performed following the standard protocol without IV contrast. COMPARISON:  No priors. FINDINGS: Cardiovascular: Heart size is normal. There is no significant pericardial fluid, thickening or pericardial calcification. No atherosclerotic calcifications in the thoracic aorta or the coronary arteries. Mediastinum/Nodes: Multiple prominent borderline enlarged mediastinal and hilar lymph nodes are nonspecific, but likely reactive. Small hiatal hernia. No axillary lymphadenopathy. Lungs/Pleura: Patchy multifocal interstitial and airspace disease scattered throughout the lungs bilaterally, compatible with multilobar bronchopneumonia. Diffuse centrilobular ground-glass attenuation micro nodularity no pleural effusions. Upper Abdomen: Diffuse low attenuation throughout the visualized hepatic parenchyma, compatible with hepatic steatosis. Bilateral breast implants incidentally noted. There are no aggressive appearing lytic or blastic lesions noted in the visualized portions of the skeleton. Musculoskeletal: There are no aggressive appearing lytic or blastic lesions noted in the visualized portions of the skeleton. IMPRESSION: 1. Severe multilobar bronchopneumonia throughout all aspects of the lungs bilaterally, as above. 2. Hepatic steatosis. Electronically Signed   By: Vinnie Langton M.D.   On: 05/24/2017 15:39   Dg Chest Port 1 View  Result Date: 05/22/2017 CLINICAL DATA:  Pneumonia EXAM: PORTABLE CHEST 1 VIEW COMPARISON:  05/21/2017 and 05/20/2017 radiographs FINDINGS: An  NG tube and endotracheal tube have been removed. Bilateral airspace opacities are again noted, decreased on the left side. There is no evidence of pneumothorax or pleural effusion. No other significant changes noted. IMPRESSION: Bilateral airspace opacities again identified, decreased on the left. NG tube and endotracheal tube removal. Electronically Signed   By: Margarette Canada M.D.   On: 05/22/2017 06:48   Dg Chest Port 1 View  Result Date: 05/21/2017 CLINICAL DATA:  Respiratory failure EXAM: PORTABLE CHEST 1 VIEW COMPARISON:  05/20/2017 FINDINGS: Endotracheal tube and nasogastric catheter are noted in satisfactory position. Cardiac shadow is stable. New patchy infiltrates are identified bilaterally. No pneumothorax or sizable effusion is seen. No bony abnormality is noted. IMPRESSION: New bilateral patchy infiltrates. Electronically Signed   By: Inez Catalina M.D.   On: 05/21/2017 07:29   Dg Chest Port 1 View  Result Date: 05/20/2017 CLINICAL DATA:  Initial evaluation status post intubation. EXAM: PORTABLE CHEST 1 VIEW COMPARISON:  None. FINDINGS: Endotracheal tube in place with tip positioned 18 mm above the carina. Enteric tube courses in the abdomen. Cardiac and mediastinal silhouettes within normal limits. Lungs hypoinflated. Secondary bilateral bronchovascular crowding with perihilar congestion. No focal infiltrates. No pulmonary edema or pleural effusion. No pneumothorax. Osseous structures within normal limits. IMPRESSION: 1. Tip of the endotracheal tube well positioned 18 mm above the carina. 2. Shallow lung inflation with secondary perihilar vascular congestion. Electronically Signed   By: Jeannine Boga M.D.   On: 05/20/2017 02:48  Microbiology: Recent Results (from the past 240 hour(s))  Culture, respiratory (NON-Expectorated)     Status: None   Collection Time: 05/20/17 12:01 PM  Result Value Ref Range Status   Specimen Description   Final    TRACHEAL ASPIRATE Performed at [place] [address], [place], [place] [address]    Special Requests   Final    NONE Performed at [facility], [place] [address], [place], [place] [address]    Gram Stain NO WBC SEEN NO ORGANISMS SEEN   Final   Culture   Final    Consistent with normal respiratory flora. Performed at [facility], [place] [address] [place], [place] [address]    Report Status 05/22/2017 FINAL  Final  MRSA PCR Screening     Status: Abnormal   Collection Time: 05/20/17  4:46 PM  Result Value Ref Range Status   MRSA by PCR POSITIVE (A) NEGATIVE Final    Comment:        The GeneXpert MRSA Assay (FDA approved for NASAL specimens only), is one component of a comprehensive MRSA colonization surveillance program. It is not intended to diagnose MRSA infection nor to guide or monitor treatment for MRSA infections. CRITICAL RESULT CALLED TO, READ BACK BY AND VERIFIED WITH: NOTIFIED R.DAVIS RN. 05/22/2017 1931 JR Performed at [facility], [place] [address], [place], [place] [address]      Labs: Basic Metabolic Panel: Recent Labs  Lab 05/21/17 0257  05/23/17 0244 05/24/17 0720 05/25/17 0521 05/26/17 0150 05/27/17 0605  NA 137   < > 138 137 137 134* 137  K 3.5   < > 3.0* 3.3* 3.5 3.9 3.7  CL 104   < > 101 104 103 104 103  CO2 28   < > 28 24 22  20* 21*  GLUCOSE 99   < > 116* 99 120* 133* 115*  BUN 6   < > 5* 5* 7 8 11   CREATININE 0.42*   < > 0.46 0.39* 0.40* 0.35* 0.37*  CALCIUM 7.2*   < > 8.5* 8.5* 8.6* 8.7* 8.8*  MG 1.1*  --  1.9  --  1.6* 2.0 1.8  PHOS 2.6  --   --   --   --   --   --    < > = values in this interval not displayed.   Liver Function Tests: Recent Labs  Lab 05/21/17 0257 05/23/17 0244 05/25/17 0521  AST 65* 23 16  ALT 123* 65* 33  ALKPHOS 51 64 57  BILITOT 0.6 0.6 0.4  PROT 5.2* 5.9* 6.1*  ALBUMIN 2.7* 2.8* 2.8*   No results for input(s): LIPASE, AMYLASE in the last 168 hours. No results for input(s):  AMMONIA in the last 168 hours. CBC: Recent Labs  Lab 05/22/17 0327 05/23/17 0244 05/24/17 0720 05/25/17 0521 05/26/17 0150  WBC 15.5* 12.2* 6.4 4.9 7.6  NEUTROABS  --  10.5* 4.4 2.7 6.0  HGB 11.6* 11.2* 11.6* 10.6* 12.3  HCT 33.5* 32.1* 33.3* 29.9* 35.1*  MCV 89.8 89.4 89.0 87.7 87.8  PLT 92* 110* 155 210 270   Cardiac Enzymes: No results for input(s): CKTOTAL, CKMB, CKMBINDEX, TROPONINI in the last 168 hours. BNP: BNP (last 3 results) No results for input(s): BNP in the last 8760 hours.  ProBNP (last 3 results) No results for input(s): PROBNP in the last 8760 hours.  CBG: No results for input(s): GLUCAP in the last 168 hours.   Signed:  Velvet Bathe MD.  [facility] 05/27/2017, 3:21 PM

## 2017-05-27 NOTE — Progress Notes (Signed)
Pt going home with friend Hank. Hank is at bedside, and feels safe to take pt home. Denies needing resources. No questions from pt of Hank. Performed DC teaching using teachback method.

## 2017-05-27 NOTE — Plan of Care (Signed)
Encouraged pt to walk. Pt agreeable but will walk later.

## 2017-06-07 ENCOUNTER — Emergency Department (HOSPITAL_COMMUNITY): Admission: EM | Admit: 2017-06-07 | Discharge: 2017-06-07 | Discharge status: Against Medical Advice | Payer: MEDICAID

## 2017-06-07 NOTE — ED Notes (Signed)
Patient called for triage with no answer

## 2017-06-08 ENCOUNTER — Ambulatory Visit (HOSPITAL_COMMUNITY)
Admission: EM | Admit: 2017-06-08 | Discharge: 2017-06-08 | Disposition: A | Discharge status: Home / Self Care | Payer: Self-pay | Attending: Family Medicine | Admitting: Family Medicine

## 2017-06-08 ENCOUNTER — Encounter (HOSPITAL_COMMUNITY): Payer: Self-pay | Admitting: Emergency Medicine

## 2017-06-08 ENCOUNTER — Ambulatory Visit (INDEPENDENT_AMBULATORY_CARE_PROVIDER_SITE_OTHER): Payer: Self-pay

## 2017-06-08 DIAGNOSIS — R05 Cough: Secondary | ICD-10-CM

## 2017-06-08 DIAGNOSIS — R059 Cough, unspecified: Secondary | ICD-10-CM

## 2017-06-08 DIAGNOSIS — F1910 Other psychoactive substance abuse, uncomplicated: Secondary | ICD-10-CM

## 2017-06-08 DIAGNOSIS — J690 Pneumonitis due to inhalation of food and vomit: Secondary | ICD-10-CM

## 2017-06-08 MED ORDER — HYDROCOD POLST-CPM POLST ER 10-8 MG/5ML PO SUER: 5.0000 mL | Freq: Two times a day (BID) | ORAL | 0 refills | Status: DC | PRN

## 2017-06-08 NOTE — ED Triage Notes (Signed)
PT was hospitalized for aspiration pnuemonia for 8 days and was discharged 2/14. PT reports fevers since leaving the hospital, SOB, cough, weakness, headaches.

## 2017-06-08 NOTE — Discharge Instructions (Signed)
Be aware, your cough medication may cause drowsiness. Please do not drive, operate heavy machinery or make important decisions while on this medication, it can cloud your judgement. Given your history of substance abuse, this is not a medicine that you should take for long periods of time; use sparingly.

## 2017-06-12 NOTE — ED Provider Notes (Signed)
[facility]   [id number] 06/08/17 Arrival Time: 7948  ASSESSMENT & PLAN:  1. Aspiration pneumonia, unspecified aspiration pneumonia type, unspecified laterality, unspecified part of lung ([place])   2. Cough   3. Substance abuse ([place])     Meds ordered this encounter  Medications  . chlorpheniramine-HYDROcodone (TUSSIONEX PENNKINETIC ER) 10-8 MG/5ML SUER    Sig: Take 5 mLs by mouth every 12 (twelve) hours as needed for cough.    Dispense:  90 mL    Refill:  0   Cough medication sedation precautions. Explained that this cough medication is not at all for long-term use. She voices understanding.  Discussed typical duration of symptoms after being treated for pneumonia. OTC symptom care as needed. Ensure adequate fluid intake and rest. May f/u with PCP or here as needed.  Reviewed expectations re: course of current medical issues. Questions answered. Outlined signs and symptoms indicating need for more acute intervention. Patient verbalized understanding. After Visit Summary given.   SUBJECTIVE: History from: patient.  Sylvia Maxwell is a 32 y.o. female who was recently discharged from the hospital after being treated for an aspiration pneumonia. Reports excessive and persistent coughing with sporadic episodes of post-tussive emeses. Afebrile. SOB: none. Wheezing: none. Overall normal PO intake. OTC cough medication not helping at all. "Can't sleep because I'm coughing so much." Physical exertion worsens cough.  Social History   Tobacco Use  Smoking Status Current Some Day Smoker  . Types: Cigarettes  Smokeless Tobacco Never Used    ROS: As per HPI.   OBJECTIVE:  Vitals:   06/08/17 1124  BP: 109/81  Pulse: (!) 112  Resp: 20  Temp: 97.6 F (36.4 C)  SpO2: 98%  Weight: 148 lb (67.1 kg)     General appearance: alert; appears fatigued HEENT: nasal congestion; clear runny nose; throat irritation secondary to post-nasal drainage Neck: supple  without LAD Lungs: unlabored respirations, symmetrical air entry; cough: marked; no respiratory distress Skin: warm and dry Psychological: alert and cooperative; normal mood and affect  Imaging: Dg Chest 2 View  Result Date: 06/08/2017 CLINICAL DATA:  Cough for 2 weeks. EXAM: CHEST  2 VIEW COMPARISON:  CT 05/24/2017.  Chest x-ray 05/24/2017. FINDINGS: Mediastinum and hilar structures are normal. Interim significant improvement of bilateral pulmonary infiltrates with mild residual interstitial prominence. No pleural effusion or pneumothorax. IMPRESSION: Interval significant improvement of bilateral pulmonary infiltrates with mild residual interstitial prominence. Electronically Signed   By: Irwin   On: 06/08/2017 12:01    Allergies  Allergen Reactions  . Lactose Intolerance (Gi) Diarrhea and Nausea And Vomiting    Depends on the type of dairy-based product consumed as to which reaction occurs    Past Medical History:  Diagnosis Date  . Alcoholism [facility])     Social History   Socioeconomic History  . Marital status: Single    Spouse name: Not on file  . Number of children: Not on file  . Years of education: Not on file  . Highest education level: Not on file  Social Needs  . Financial resource strain: Not on file  . Food insecurity - worry: Not on file  . Food insecurity - inability: Not on file  . Transportation needs - medical: Not on file  . Transportation needs - non-medical: Not on file  Occupational History  . Not on file  Tobacco Use  . Smoking status: Current Some Day Smoker    Types: Cigarettes  . Smokeless tobacco: Never Used  Substance and  Sexual Activity  . Alcohol use: Yes    Comment: 1 bottle of wine per day and "shots"  . Drug use: Yes    Comment: Meth, heroin   . Sexual activity: Yes  Other Topics Concern  . Not on file  Social History Narrative  . Not on file           Vanessa Kick, MD 06/12/17 1157

## 2017-10-31 ENCOUNTER — Emergency Department (HOSPITAL_COMMUNITY)
Admission: EM | Admit: 2017-10-31 | Discharge: 2017-11-01 | Disposition: A | Discharge status: Home / Self Care | Payer: Self-pay | Attending: Emergency Medicine | Admitting: Emergency Medicine

## 2017-10-31 ENCOUNTER — Encounter (HOSPITAL_COMMUNITY): Payer: Self-pay

## 2017-10-31 ENCOUNTER — Other Ambulatory Visit: Payer: Self-pay

## 2017-10-31 DIAGNOSIS — F17210 Nicotine dependence, cigarettes, uncomplicated: Secondary | ICD-10-CM | POA: Insufficient documentation

## 2017-10-31 DIAGNOSIS — Z79899 Other long term (current) drug therapy: Secondary | ICD-10-CM | POA: Insufficient documentation

## 2017-10-31 DIAGNOSIS — F1010 Alcohol abuse, uncomplicated: Secondary | ICD-10-CM | POA: Insufficient documentation

## 2017-10-31 LAB — COMPREHENSIVE METABOLIC PANEL
ALK PHOS: 55 U/L (ref 38–126)
ALT: 22 U/L (ref 0–44)
ANION GAP: 11 (ref 5–15)
AST: 21 U/L (ref 15–41)
Albumin: 4.4 g/dL (ref 3.5–5.0)
BILIRUBIN TOTAL: 0.3 mg/dL (ref 0.3–1.2)
BUN: 6 mg/dL (ref 6–20)
CALCIUM: 8.8 mg/dL — AB (ref 8.9–10.3)
CO2: 24 mmol/L (ref 22–32)
Chloride: 105 mmol/L (ref 98–111)
Creatinine, Ser: 0.48 mg/dL (ref 0.44–1.00)
GFR calc Af Amer: >60 mL/min (ref >60)
GLUCOSE: 93 mg/dL (ref 70–99)
POTASSIUM: 3.6 mmol/L (ref 3.5–5.1)
Sodium: 140 mmol/L (ref 135–145)
TOTAL PROTEIN: 7.9 g/dL (ref 6.5–8.1)

## 2017-10-31 LAB — URINALYSIS, ROUTINE W REFLEX MICROSCOPIC
BILIRUBIN URINE: NEGATIVE
Glucose, UA: NEGATIVE mg/dL
Hgb urine dipstick: NEGATIVE
Ketones, ur: NEGATIVE mg/dL
Leukocytes, UA: NEGATIVE
Nitrite: NEGATIVE
PH: 5 (ref 5.0–8.0)
Protein, ur: NEGATIVE mg/dL
SPECIFIC GRAVITY, URINE: 1.009 (ref 1.005–1.030)

## 2017-10-31 LAB — I-STAT BETA HCG BLOOD, ED (MC, WL, AP ONLY)

## 2017-10-31 LAB — RAPID URINE DRUG SCREEN, HOSP PERFORMED
AMPHETAMINES: NOT DETECTED
Barbiturates: NOT DETECTED
Benzodiazepines: NOT DETECTED
Cocaine: NOT DETECTED
OPIATES: NOT DETECTED
Tetrahydrocannabinol: NOT DETECTED

## 2017-10-31 LAB — CBC
HEMATOCRIT: 42.2 % (ref 36.0–46.0)
Hemoglobin: 14.6 g/dL (ref 12.0–15.0)
MCH: 30.9 pg (ref 26.0–34.0)
MCHC: 34.6 g/dL (ref 30.0–36.0)
MCV: 89.4 fL (ref 78.0–100.0)
Platelets: 203 10*3/uL (ref 150–400)
RBC: 4.72 MIL/uL (ref 3.87–5.11)
RDW: 12.8 % (ref 11.5–15.5)
WBC: 5.4 10*3/uL (ref 4.0–10.5)

## 2017-10-31 LAB — ACETAMINOPHEN LEVEL

## 2017-10-31 LAB — ETHANOL: ALCOHOL ETHYL (B): 303 mg/dL — AB (ref <10)

## 2017-10-31 LAB — SALICYLATE LEVEL: Salicylate Lvl: <7 mg/dL (ref 2.8–30.0)

## 2017-10-31 NOTE — ED Notes (Signed)
Bed: WA28 Expected date:  Expected time:  Means of arrival:  Comments: 

## 2017-10-31 NOTE — Progress Notes (Signed)
Spoke to [employer] at [employer] regarding to patient medical clear  And DC by MD but blood alcohol level is 303.  Hassan Rowan stated that ABL has to be below 200 on redraw.  Sophia PA informed

## 2017-10-31 NOTE — ED Triage Notes (Signed)
Pt. admitted to [facility] in [place] but due to alcohol ingestion needs medical clearance. Pt states she has had a Magnum and a half of wine since this morning. Pt alert and oriented, answering questions appropriately without slurring or extreme signs of intoxication.

## 2017-10-31 NOTE — ED Provider Notes (Signed)
[facility] DEPT Provider Note   CSN: [id number] Arrival date & time: 10/31/17  1820     History   Chief Complaint Chief Complaint  Patient presents with  . Drug / Alcohol Assessment    HPI Sylvia Maxwell is a 32 y.o. female presenting for evaluation of medical clearance.  History provided by patient and patient's boyfriend.  Patient has a history of alcohol abuse.  She was sober from October until last week.  She started drinking again last week.  Last drink just prior to arrival.  Patient has been admitted to a rehab facility in [place], but was told she needs medical clearance before she can go.  She is currently symptom-free.  She denies fevers, chills, chest pain, shortness breath, nausea, vomiting, abdominal pain, urinary symptoms, normal bowel movements.  She has no medical problems, takes no medications daily.  She has had seizures with withdrawal once, is not feeling tremulous now.  She denies SI, HI, or AVH.  HPI  Past Medical History:  Diagnosis Date  . Alcoholism [facility])     Patient Active Problem List   Diagnosis Date Noted  . Polysubstance (including opioids) dependence with physiol dependence ([place]) 05/22/2017  . Major depressive disorder, single episode, severe ([place]) 05/22/2017  . Heroin overdose ([place]) 05/20/2017  . Alcohol dependence with withdrawal with complication ([place]) 94/49/6759  . Alcohol withdrawal seizure ([place]) 06/13/2016  . Seizure ([place])   . ETOH abuse 06/12/2016  . Alcohol withdrawal seizure without complication ([place]) 16/38/4665    Past Surgical History:  Procedure Laterality Date  . BREAST ENHANCEMENT SURGERY  2008     OB History   None      Home Medications    Prior to Admission medications   Medication Sig Start Date End Date Taking? Authorizing Provider  Ascorbic Acid (VITAMIN C) POWD Take 5 mLs by mouth daily.    Yes [provider]  b complex vitamins tablet Take 1 tablet by  mouth daily.   Yes [provider]  Methylsulfonylmethane (MSM) POWD Take 10 mLs by mouth daily.   Yes [provider]  Multiple Vitamin (MULTIVITAMIN WITH MINERALS) TABS tablet Take 1 tablet by mouth daily.   Yes [provider]  tetrahydrozoline 0.05 % ophthalmic solution Place 1 drop into both eyes 4 (four) times daily.   Yes [provider]  chlorpheniramine-HYDROcodone (TUSSIONEX PENNKINETIC ER) 10-8 MG/5ML SUER Take 5 mLs by mouth every 12 (twelve) hours as needed for cough. Patient not taking: Reported on 10/31/2017 06/08/17   Vanessa Kick, MD  gabapentin (NEURONTIN) 300 MG capsule Take 1 capsule (300 mg total) by mouth 3 (three) times daily. Patient not taking: Reported on 10/31/2017 01/31/17   Patrecia Pour, NP    Family History History reviewed. No pertinent family history.  Social History Social History   Tobacco Use  . Smoking status: Current Some Day Smoker    Types: Cigarettes  . Smokeless tobacco: Never Used  Substance Use Topics  . Alcohol use: Yes    Comment: 1 bottle of wine per day and "shots"  . Drug use: Yes    Comment: Meth, heroin      Allergies   Lactose intolerance (gi)   Review of Systems Review of Systems  All other systems reviewed and are negative.    Physical Exam Updated Vital Signs BP 110/79 (BP Location: Right Arm)   Pulse 96   Temp 98.4 F (36.9 C) (Oral)   Resp 18  SpO2 95%   Physical Exam  Constitutional: She is oriented to person, place, and time. She appears well-developed and well-nourished. No distress.  HENT:  Head: Normocephalic and atraumatic.  Eyes: EOM are normal.  Neck: Normal range of motion.  Cardiovascular: Normal rate, regular rhythm and intact distal pulses.  Pulmonary/Chest: Effort normal and breath sounds normal. No respiratory distress. She has no wheezes.  Abdominal: Soft. She exhibits no distension. There is no tenderness.  Musculoskeletal: Normal range of motion.    Neurological: She is alert and oriented to person, place, and time.  No tremors or sz-like activity  Skin: Skin is warm. No rash noted.  Psychiatric: She has a normal mood and affect.  Nursing note and vitals reviewed.    ED Treatments / Results  Labs (all labs ordered are listed, but only abnormal results are displayed) Labs Reviewed  COMPREHENSIVE METABOLIC PANEL - Abnormal; Notable for the following components:      Result Value   Calcium 8.8 (*)    All other components within normal limits  URINALYSIS, ROUTINE W REFLEX MICROSCOPIC - Abnormal; Notable for the following components:   Color, Urine STRAW (*)    All other components within normal limits  ETHANOL - Abnormal; Notable for the following components:   Alcohol, Ethyl (B) 303 (*)    All other components within normal limits  ACETAMINOPHEN LEVEL - Abnormal; Notable for the following components:   Acetaminophen (Tylenol), Serum <10 (*)    All other components within normal limits  CBC  RAPID URINE DRUG SCREEN, HOSP PERFORMED  SALICYLATE LEVEL  ETHANOL  I-STAT BETA HCG BLOOD, ED ([facility], [facility], [facility] ONLY)    EKG None  Radiology No results found.  Procedures Procedures (including critical care time)  Medications Ordered in ED Medications - No data to display   Initial Impression / Assessment and Plan / ED Course  I have reviewed the triage vital signs and the nursing notes.  Pertinent labs & imaging results that were available during my care of the patient were reviewed by me and considered in my medical decision making (see chart for details).     Patient presenting for medical clearance so she can go to a detox facility.  Physical exam reassuring, patient without tachycardia or tremors.  Currently symptom-free.  Will obtain medical clearance labs and reassess.  Labs and urine mostly reassuring.  Ethanol elevated at 300. Pt is medically cleared.   Called facility, who states pt's ethanol needs to be below 200  before admission, state they were not expecting pt until 9am tomorrow. Will redraw ethanol level in 5 hrs. Pt and boyfriend informed of plan.   Pt signed out to Ermalinda Memos, PA-C for f/u on ethanol and final medical clearance.    Final Clinical Impressions(s) / ED Diagnoses   Final diagnoses:  Alcohol abuse    ED Discharge Orders    None       Franchot Heidelberg, PA-C 10/31/17 2302    Valarie Merino, MD 10/31/17 2322

## 2017-10-31 NOTE — Discharge Instructions (Addendum)
You are medically cleared to go to [facility] at this time.  If anything changes, you can return to the ER. Return with any new, worsening, or concerning symptoms.

## 2017-11-01 LAB — ETHANOL: Alcohol, Ethyl (B): 119 mg/dL — ABNORMAL HIGH (ref <10)

## 2017-11-01 NOTE — ED Notes (Signed)
Pt being DC to home

## 2017-11-01 NOTE — ED Provider Notes (Signed)
2:33 AM Ethanol improved to 119. Stable for transfer to [facility] now that ETOH <200. Patient medically cleared.   Antonietta Breach, PA-C 11/01/17 0233    Shanon Rosser, MD 11/01/17 [phone]

## 2017-11-24 DIAGNOSIS — N898 Other specified noninflammatory disorders of vagina: Secondary | ICD-10-CM | POA: Insufficient documentation

## 2017-11-24 DIAGNOSIS — Z0389 Encounter for observation for other suspected diseases and conditions ruled out: Secondary | ICD-10-CM | POA: Insufficient documentation

## 2017-11-25 ENCOUNTER — Encounter (HOSPITAL_COMMUNITY): Payer: Self-pay

## 2017-11-25 ENCOUNTER — Emergency Department (HOSPITAL_COMMUNITY)
Admission: EM | Admit: 2017-11-25 | Discharge: 2017-11-25 | Disposition: A | Discharge status: Home / Self Care | Payer: Self-pay | Attending: Emergency Medicine | Admitting: Emergency Medicine

## 2017-11-25 DIAGNOSIS — N898 Other specified noninflammatory disorders of vagina: Secondary | ICD-10-CM

## 2017-11-25 LAB — COMPREHENSIVE METABOLIC PANEL
ALBUMIN: 4.3 g/dL (ref 3.5–5.0)
ALK PHOS: 59 U/L (ref 38–126)
ALT: 17 U/L (ref 0–44)
ANION GAP: 8 (ref 5–15)
AST: 16 U/L (ref 15–41)
BUN: 18 mg/dL (ref 6–20)
CALCIUM: 9.9 mg/dL (ref 8.9–10.3)
CHLORIDE: 105 mmol/L (ref 98–111)
CO2: 25 mmol/L (ref 22–32)
CREATININE: 0.42 mg/dL — AB (ref 0.44–1.00)
GFR calc Af Amer: >60 mL/min (ref >60)
GFR calc non Af Amer: >60 mL/min (ref >60)
GLUCOSE: 95 mg/dL (ref 70–99)
Potassium: 4.1 mmol/L (ref 3.5–5.1)
Sodium: 138 mmol/L (ref 135–145)
Total Bilirubin: 0.5 mg/dL (ref 0.3–1.2)
Total Protein: 7.3 g/dL (ref 6.5–8.1)

## 2017-11-25 LAB — URINALYSIS, ROUTINE W REFLEX MICROSCOPIC
BILIRUBIN URINE: NEGATIVE
Glucose, UA: NEGATIVE mg/dL
HGB URINE DIPSTICK: NEGATIVE
KETONES UR: NEGATIVE mg/dL
LEUKOCYTES UA: NEGATIVE
Nitrite: NEGATIVE
PH: 6 (ref 5.0–8.0)
PROTEIN: NEGATIVE mg/dL
Specific Gravity, Urine: 1.008 (ref 1.005–1.030)

## 2017-11-25 LAB — CBC
HEMATOCRIT: 40.1 % (ref 36.0–46.0)
Hemoglobin: 13.4 g/dL (ref 12.0–15.0)
MCH: 30.6 pg (ref 26.0–34.0)
MCHC: 33.4 g/dL (ref 30.0–36.0)
MCV: 91.6 fL (ref 78.0–100.0)
PLATELETS: 206 10*3/uL (ref 150–400)
RBC: 4.38 MIL/uL (ref 3.87–5.11)
RDW: 13.3 % (ref 11.5–15.5)
WBC: 6 10*3/uL (ref 4.0–10.5)

## 2017-11-25 LAB — GC/CHLAMYDIA PROBE AMP (~~LOC~~) NOT AT ARMC
Chlamydia: NEGATIVE
Neisseria Gonorrhea: NEGATIVE

## 2017-11-25 LAB — WET PREP, GENITAL
Sperm: NONE SEEN
Trich, Wet Prep: NONE SEEN
Yeast Wet Prep HPF POC: NONE SEEN

## 2017-11-25 LAB — I-STAT BETA HCG BLOOD, ED (MC, WL, AP ONLY)

## 2017-11-25 LAB — RAPID URINE DRUG SCREEN, HOSP PERFORMED
AMPHETAMINES: NOT DETECTED
BARBITURATES: NOT DETECTED
Benzodiazepines: NOT DETECTED
Cocaine: NOT DETECTED
TETRAHYDROCANNABINOL: NOT DETECTED

## 2017-11-25 LAB — ETHANOL: Alcohol, Ethyl (B): <10 mg/dL (ref <10)

## 2017-11-25 MED ORDER — METRONIDAZOLE 500 MG PO TABS
2000.0000 mg | ORAL_TABLET | Freq: Once | ORAL | Status: AC
  Administered 2017-11-25: 2000 mg via ORAL
  Filled 2017-11-25: qty 4

## 2017-11-25 NOTE — ED Triage Notes (Signed)
Pt states that she can go back to the [facility] with a clean drug screen

## 2017-11-25 NOTE — ED Triage Notes (Signed)
Pt is getting into the [facility] and needs a urine drug screen and she also thinks she has a UTI

## 2017-11-25 NOTE — ED Provider Notes (Signed)
[facility] DEPT Provider Note   CSN: [id number] Arrival date & time: 11/24/17  2357     History   Chief Complaint Chief Complaint  Patient presents with  . Drug / Alcohol Assessment    HPI Sylvia Maxwell is a 32 y.o. female.  32 yo F here for a drug screen.  Patient states that she had one done as part of her treatment program and came back positive for marijuana as well as alcohol.  She denies using either of these.  She thinks that the test was faulty.  She called the facility and they recommended she come to the emergency department for repeat testing.  She also has been complaining of something going on down there.  She points to her pelvic region.  She does not further quantify.  Denies dysuria increased frequency abdominal pain flank pain denies fevers.  She did a vinegar douche a couple hours ago.  The history is provided by the patient.  Drug / Alcohol Assessment  Pertinent negatives include no fever, no nausea and no vomiting.  Illness  This is a new problem. The current episode started 12 to 24 hours ago. The problem occurs constantly. The problem has not changed since onset.Pertinent negatives include no chest pain, no abdominal pain, no headaches and no shortness of breath. Nothing aggravates the symptoms. Nothing relieves the symptoms. She has tried nothing for the symptoms. The treatment provided no relief.    Past Medical History:  Diagnosis Date  . Alcoholism [facility])     Patient Active Problem List   Diagnosis Date Noted  . Polysubstance (including opioids) dependence with physiol dependence ([place]) 05/22/2017  . Major depressive disorder, single episode, severe ([place]) 05/22/2017  . Heroin overdose ([place]) 05/20/2017  . Alcohol dependence with withdrawal with complication ([place]) 04/21/3233  . Alcohol withdrawal seizure ([place]) 06/13/2016  . Seizure ([place])   . ETOH abuse 06/12/2016  . Alcohol withdrawal seizure without  complication ([place]) 57/32/2025    Past Surgical History:  Procedure Laterality Date  . BREAST ENHANCEMENT SURGERY  2008     OB History   None      Home Medications    Prior to Admission medications   Medication Sig Start Date End Date Taking? Authorizing Provider  Ascorbic Acid (VITAMIN C) POWD Take 5 mLs by mouth daily.     [provider]  b complex vitamins tablet Take 1 tablet by mouth daily.    [provider]  chlorpheniramine-HYDROcodone (TUSSIONEX PENNKINETIC ER) 10-8 MG/5ML SUER Take 5 mLs by mouth every 12 (twelve) hours as needed for cough. Patient not taking: Reported on 10/31/2017 06/08/17   Vanessa Kick, MD  gabapentin (NEURONTIN) 300 MG capsule Take 1 capsule (300 mg total) by mouth 3 (three) times daily. Patient not taking: Reported on 10/31/2017 01/31/17   Patrecia Pour, NP  Methylsulfonylmethane (MSM) POWD Take 10 mLs by mouth daily.    [provider]  Multiple Vitamin (MULTIVITAMIN WITH MINERALS) TABS tablet Take 1 tablet by mouth daily.    [provider]  tetrahydrozoline 0.05 % ophthalmic solution Place 1 drop into both eyes 4 (four) times daily.    [provider]    Family History History reviewed. No pertinent family history.  Social History Social History   Tobacco Use  . Smoking status: Current Some Day Smoker    Types: Cigarettes  . Smokeless tobacco: Never Used  Substance Use Topics  . Alcohol use: Yes    Comment:  1 bottle of wine per day and "shots"  . Drug use: Yes    Comment: Meth, heroin      Allergies   Lactose intolerance (gi)   Review of Systems Review of Systems  Constitutional: Negative for chills and fever.  HENT: Negative for congestion and rhinorrhea.   Eyes: Negative for redness and visual disturbance.  Respiratory: Negative for shortness of breath and wheezing.   Cardiovascular: Negative for chest pain and palpitations.  Gastrointestinal: Negative for abdominal pain,  nausea and vomiting.  Genitourinary: Negative for dysuria and urgency.  Musculoskeletal: Negative for arthralgias and myalgias.  Skin: Negative for pallor and wound.  Neurological: Negative for dizziness and headaches.     Physical Exam Updated Vital Signs BP 97/62 (BP Location: Right Arm)   Pulse (!) 57   Temp 98.3 F (36.8 C) (Oral)   Resp 16   Ht 5\' 6"  (1.676 m)   Wt 59 kg   SpO2 95%   BMI 20.98 kg/m   Physical Exam  Constitutional: She is oriented to person, place, and time. She appears well-developed and well-nourished. No distress.  HENT:  Head: Normocephalic and atraumatic.  Eyes: Pupils are equal, round, and reactive to light. EOM are normal.  Neck: Normal range of motion. Neck supple.  Cardiovascular: Normal rate and regular rhythm. Exam reveals no gallop and no friction rub.  No murmur heard. Pulmonary/Chest: Effort normal. She has no wheezes. She has no rales.  Abdominal: Soft. She exhibits no distension. There is no tenderness.  Genitourinary:  Genitourinary Comments: Changes to the cervix, easily friable erythematous around the office.  White discharge.  Very mild cervical motion tenderness.  No adnexal tenderness.  Musculoskeletal: She exhibits no edema or tenderness.  Neurological: She is alert and oriented to person, place, and time.  Skin: Skin is warm and dry. She is not diaphoretic.  Psychiatric: She has a normal mood and affect. Her behavior is normal.  Nursing note and vitals reviewed.    ED Treatments / Results  Labs (all labs ordered are listed, but only abnormal results are displayed) Labs Reviewed  WET PREP, GENITAL - Abnormal; Notable for the following components:      Result Value   Clue Cells Wet Prep HPF POC PRESENT (*)    WBC, Wet Prep HPF POC FEW (*)    All other components within normal limits  COMPREHENSIVE METABOLIC PANEL - Abnormal; Notable for the following components:   Creatinine, Ser 0.42 (*)    All other components within  normal limits  RAPID URINE DRUG SCREEN, HOSP PERFORMED - Abnormal; Notable for the following components:   Opiates   (*)    Value: Result not available. Reagent lot number recalled by manufacturer.   All other components within normal limits  URINALYSIS, ROUTINE W REFLEX MICROSCOPIC - Abnormal; Notable for the following components:   Color, Urine STRAW (*)    All other components within normal limits  ETHANOL  CBC  I-STAT BETA HCG BLOOD, ED ([facility], [facility], [facility] ONLY)  GC/CHLAMYDIA PROBE AMP ([place]) NOT AT [facility]    EKG None  Radiology No results found.  Procedures Procedures (including critical care time)  Medications Ordered in ED Medications  metroNIDAZOLE (FLAGYL) tablet 2,000 mg (has no administration in time range)     Initial Impression / Assessment and Plan / ED Course  I have reviewed the triage vital signs and the nursing notes.  Pertinent labs & imaging results that were available during my care of the  patient were reviewed by me and considered in my medical decision making (see chart for details).     32 yo F complaining of vaginal discharge.  Also concerned that her drug screen was positive though is adamant that she hasnt used any illegal substances.   Drug screen here is negative.  EtOH negative.  UA negative for infection, not anemic, electrolytes without an abnormality.  Discussed results with the patient.  Pelvic exam with cervical changes, suggested the patient follow-up with her PCP or GYN.  Will treat for BV.  Discharge home.  3:01 AM:  I have discussed the diagnosis/risks/treatment options with the patient and believe the pt to be eligible for discharge home to follow-up with PCP, GYN. We also discussed returning to the ED immediately if new or worsening sx occur. We discussed the sx which are most concerning (e.g., sudden worsening pain, fever, inability to tolerate by mouth) that necessitate immediate return. Medications administered to the patient during  their visit and any new prescriptions provided to the patient are listed below.  Medications given during this visit Medications  metroNIDAZOLE (FLAGYL) tablet 2,000 mg (has no administration in time range)      The patient appears reasonably screen and/or stabilized for discharge and I doubt any other medical condition or other [facility] requiring further screening, evaluation, or treatment in the ED at this time prior to discharge.    Final Clinical Impressions(s) / ED Diagnoses   Final diagnoses:  Vaginal discharge    ED Discharge Orders    None       Deno Etienne, DO 11/25/17 0301

## 2017-11-25 NOTE — Discharge Instructions (Signed)
Your cervix had some changes that need to be further evaluated with a PAP smear.  This is not done in the ED, go to a gyn, or family doctor that can do this for you.  You had a different bacteria than normal on your swab.  The medicine given you here should treat it, please do not douche.

## 2017-12-31 ENCOUNTER — Encounter (HOSPITAL_COMMUNITY): Payer: Self-pay | Admitting: Emergency Medicine

## 2017-12-31 ENCOUNTER — Ambulatory Visit (HOSPITAL_COMMUNITY)
Admission: EM | Admit: 2017-12-31 | Discharge: 2017-12-31 | Disposition: A | Discharge status: Home / Self Care | Payer: Self-pay | Attending: Family Medicine | Admitting: Family Medicine

## 2017-12-31 ENCOUNTER — Other Ambulatory Visit: Payer: Self-pay

## 2017-12-31 DIAGNOSIS — J40 Bronchitis, not specified as acute or chronic: Secondary | ICD-10-CM

## 2017-12-31 MED ORDER — AMOXICILLIN 875 MG PO TABS: 875.0000 mg | ORAL_TABLET | Freq: Two times a day (BID) | ORAL | 0 refills | Status: DC

## 2017-12-31 MED ORDER — ALBUTEROL SULFATE HFA 108 (90 BASE) MCG/ACT IN AERS: 1.0000 | INHALATION_SPRAY | Freq: Four times a day (QID) | RESPIRATORY_TRACT | Status: DC | PRN

## 2017-12-31 MED ORDER — ALBUTEROL SULFATE HFA 108 (90 BASE) MCG/ACT IN AERS
INHALATION_SPRAY | RESPIRATORY_TRACT | Status: AC
  Filled 2017-12-31: qty 6.7

## 2017-12-31 NOTE — ED Triage Notes (Signed)
Pt reports nasal congestion and cough x2 weeks.  She has tried Mucinex for her symptoms.

## 2017-12-31 NOTE — ED Provider Notes (Signed)
[facility]    CSN: [id number] Arrival date & time: 12/31/17  [facility]     History   Chief Complaint Chief Complaint  Patient presents with  . URI    HPI Sylvia Maxwell is a 32 y.o. female.   Pt reports nasal congestion and cough x 2 weeks.  She has tried Mucinex for her symptoms.  Patient does smoke although she has had no history of asthma.  She does not have any documented fever.  She has had some sinus congestion along with the cough.     Past Medical History:  Diagnosis Date  . Alcoholism [facility])     Patient Active Problem List   Diagnosis Date Noted  . Polysubstance (including opioids) dependence with physiol dependence ([place]) 05/22/2017  . Major depressive disorder, single episode, severe ([place]) 05/22/2017  . Heroin overdose ([place]) 05/20/2017  . Alcohol dependence with withdrawal with complication ([place]) 25/85/2778  . Alcohol withdrawal seizure ([place]) 06/13/2016  . Seizure ([place])   . ETOH abuse 06/12/2016  . Alcohol withdrawal seizure without complication ([place]) 24/23/5361    Past Surgical History:  Procedure Laterality Date  . BREAST ENHANCEMENT SURGERY  2008    OB History   None      Home Medications    Prior to Admission medications   Medication Sig Start Date End Date Taking? Authorizing Provider  Methylsulfonylmethane (MSM) POWD Take 10 mLs by mouth daily.   Yes [provider]  Multiple Vitamin (MULTIVITAMIN WITH MINERALS) TABS tablet Take 1 tablet by mouth daily.   Yes [provider]  amoxicillin (AMOXIL) 875 MG tablet Take 1 tablet (875 mg total) by mouth 2 (two) times daily. 12/31/17   Robyn Haber, MD  Ascorbic Acid (VITAMIN C) POWD Take 5 mLs by mouth daily.     [provider]  b complex vitamins tablet Take 1 tablet by mouth daily.    [provider]  tetrahydrozoline 0.05 % ophthalmic solution Place 1 drop into both eyes 4 (four) times daily.    [provider]    Family  History History reviewed. No pertinent family history.  Social History Social History   Tobacco Use  . Smoking status: Current Some Day Smoker    Types: Cigarettes  . Smokeless tobacco: Never Used  Substance Use Topics  . Alcohol use: Yes    Comment: 1 bottle of wine per day and "shots"  . Drug use: Yes    Comment: Meth, heroin      Allergies   Lactose intolerance (gi)   Review of Systems Review of Systems  Constitutional: Negative.   HENT: Positive for congestion and sinus pressure.   Respiratory: Positive for cough. Negative for shortness of breath.   All other systems reviewed and are negative.    Physical Exam Triage Vital Signs ED Triage Vitals [12/31/17 1847]  Enc Vitals Group     BP      Pulse      Resp      Temp      Temp src      SpO2      Weight      Height      Head Circumference      Peak Flow      Pain Score 0     Pain Loc      Pain Edu?      Excl. in [place]?    No data found.  Updated Vital Signs BP 110/71 (BP Location: Left Arm)  Pulse 74   Temp 98.6 F (37 C) (Oral)   LMP 12/17/2017 (Approximate)   SpO2 100%    Physical Exam  Constitutional: She is oriented to person, place, and time. She appears well-developed and well-nourished.  HENT:  Right Ear: External ear normal.  Left Ear: External ear normal.  Mouth/Throat: Oropharynx is clear and moist.  Eyes: Pupils are equal, round, and reactive to light. Conjunctivae are normal.  Neck: Normal range of motion. Neck supple.  Cardiovascular: Normal rate, regular rhythm and normal heart sounds.  Pulmonary/Chest: Effort normal. She has wheezes.  Musculoskeletal: Normal range of motion.  Neurological: She is alert and oriented to person, place, and time.  Skin: Skin is warm and dry.  Nursing note and vitals reviewed.    UC Treatments / Results  Labs (all labs ordered are listed, but only abnormal results are displayed) Labs Reviewed - No data to display  EKG None  Radiology No  results found.  Procedures Procedures (including critical care time)  Medications Ordered in UC Medications  albuterol (PROVENTIL HFA;VENTOLIN HFA) 108 (90 Base) MCG/ACT inhaler 1-2 puff (has no administration in time range)    Initial Impression / Assessment and Plan / UC Course  I have reviewed the triage vital signs and the nursing notes.  Pertinent labs & imaging results that were available during my care of the patient were reviewed by me and considered in my medical decision making (see chart for details).    Final Clinical Impressions(s) / UC Diagnoses   Final diagnoses:  Bronchitis   Discharge Instructions   None    ED Prescriptions    Medication Sig Dispense Auth. Provider   amoxicillin (AMOXIL) 875 MG tablet Take 1 tablet (875 mg total) by mouth 2 (two) times daily. 20 tablet Robyn Haber, MD     Controlled Substance Prescriptions [place] Controlled Substance Registry consulted? Not Applicable   Robyn Haber, MD 12/31/17 [phone]

## 2018-01-11 ENCOUNTER — Emergency Department (HOSPITAL_COMMUNITY)
Admission: EM | Admit: 2018-01-11 | Discharge: 2018-01-12 | Disposition: A | Discharge status: Home / Self Care | Payer: Self-pay | Attending: Emergency Medicine | Admitting: Emergency Medicine

## 2018-01-11 ENCOUNTER — Encounter (HOSPITAL_COMMUNITY): Payer: Self-pay

## 2018-01-11 ENCOUNTER — Emergency Department (HOSPITAL_COMMUNITY): Payer: Self-pay

## 2018-01-11 ENCOUNTER — Other Ambulatory Visit: Payer: Self-pay

## 2018-01-11 DIAGNOSIS — R519 Headache, unspecified: Secondary | ICD-10-CM

## 2018-01-11 DIAGNOSIS — M542 Cervicalgia: Secondary | ICD-10-CM | POA: Insufficient documentation

## 2018-01-11 DIAGNOSIS — R51 Headache: Secondary | ICD-10-CM | POA: Insufficient documentation

## 2018-01-11 DIAGNOSIS — O99331 Smoking (tobacco) complicating pregnancy, first trimester: Secondary | ICD-10-CM | POA: Insufficient documentation

## 2018-01-11 DIAGNOSIS — Z3490 Encounter for supervision of normal pregnancy, unspecified, unspecified trimester: Secondary | ICD-10-CM | POA: Insufficient documentation

## 2018-01-11 DIAGNOSIS — F17210 Nicotine dependence, cigarettes, uncomplicated: Secondary | ICD-10-CM | POA: Insufficient documentation

## 2018-01-11 DIAGNOSIS — Z79899 Other long term (current) drug therapy: Secondary | ICD-10-CM | POA: Insufficient documentation

## 2018-01-11 DIAGNOSIS — O9989 Other specified diseases and conditions complicating pregnancy, childbirth and the puerperium: Secondary | ICD-10-CM | POA: Insufficient documentation

## 2018-01-11 HISTORY — DX: Bronchitis, not specified as acute or chronic: J40

## 2018-01-11 LAB — CBC WITH DIFFERENTIAL/PLATELET
Basophils Absolute: 0 10*3/uL (ref 0.0–0.1)
Basophils Relative: 0 %
EOS PCT: 1 %
Eosinophils Absolute: 0 10*3/uL (ref 0.0–0.7)
HCT: 40.3 % (ref 36.0–46.0)
Hemoglobin: 14.2 g/dL (ref 12.0–15.0)
LYMPHS ABS: 2.9 10*3/uL (ref 0.7–4.0)
LYMPHS PCT: 52 %
MCH: 31.6 pg (ref 26.0–34.0)
MCHC: 35.2 g/dL (ref 30.0–36.0)
MCV: 89.8 fL (ref 78.0–100.0)
MONOS PCT: 5 %
Monocytes Absolute: 0.3 10*3/uL (ref 0.1–1.0)
Neutro Abs: 2.4 10*3/uL (ref 1.7–7.7)
Neutrophils Relative %: 42 %
PLATELETS: 204 10*3/uL (ref 150–400)
RBC: 4.49 MIL/uL (ref 3.87–5.11)
RDW: 12.7 % (ref 11.5–15.5)
WBC: 5.6 10*3/uL (ref 4.0–10.5)

## 2018-01-11 LAB — COMPREHENSIVE METABOLIC PANEL
ALT: 14 U/L (ref 0–44)
AST: 21 U/L (ref 15–41)
Albumin: 4.2 g/dL (ref 3.5–5.0)
Alkaline Phosphatase: 47 U/L (ref 38–126)
Anion gap: 12 (ref 5–15)
BUN: 6 mg/dL (ref 6–20)
CHLORIDE: 102 mmol/L (ref 98–111)
CO2: 26 mmol/L (ref 22–32)
Calcium: 9 mg/dL (ref 8.9–10.3)
Creatinine, Ser: 0.43 mg/dL — ABNORMAL LOW (ref 0.44–1.00)
GFR calc Af Amer: >60 mL/min (ref >60)
Glucose, Bld: 90 mg/dL (ref 70–99)
POTASSIUM: 3.9 mmol/L (ref 3.5–5.1)
SODIUM: 140 mmol/L (ref 135–145)
Total Bilirubin: 0.5 mg/dL (ref 0.3–1.2)
Total Protein: 7.4 g/dL (ref 6.5–8.1)

## 2018-01-11 LAB — I-STAT BETA HCG BLOOD, ED (MC, WL, AP ONLY): I-stat hCG, quantitative: >2000 m[IU]/mL — ABNORMAL HIGH (ref <5)

## 2018-01-11 LAB — I-STAT CHEM 8, ED
BUN: 4 mg/dL — AB (ref 6–20)
CREATININE: 0.7 mg/dL (ref 0.44–1.00)
Calcium, Ion: 1.08 mmol/L — ABNORMAL LOW (ref 1.15–1.40)
Chloride: 100 mmol/L (ref 98–111)
Glucose, Bld: 87 mg/dL (ref 70–99)
HEMATOCRIT: 43 % (ref 36.0–46.0)
Hemoglobin: 14.6 g/dL (ref 12.0–15.0)
POTASSIUM: 3.7 mmol/L (ref 3.5–5.1)
Sodium: 139 mmol/L (ref 135–145)
TCO2: 25 mmol/L (ref 22–32)

## 2018-01-11 LAB — CBG MONITORING, ED: GLUCOSE-CAPILLARY: 95 mg/dL (ref 70–99)

## 2018-01-11 LAB — ETHANOL: Alcohol, Ethyl (B): 164 mg/dL — ABNORMAL HIGH (ref <10)

## 2018-01-11 MED ORDER — SODIUM CHLORIDE 0.9 % IV BOLUS
1000.0000 mL | Freq: Once | INTRAVENOUS | Status: AC
  Administered 2018-01-11: 1000 mL via INTRAVENOUS

## 2018-01-11 MED ORDER — IOPAMIDOL (ISOVUE-370) INJECTION 76%
INTRAVENOUS | Status: AC
  Filled 2018-01-11: qty 100

## 2018-01-11 MED ORDER — IOPAMIDOL (ISOVUE-370) INJECTION 76%
80.0000 mL | Freq: Once | INTRAVENOUS | Status: AC | PRN
  Administered 2018-01-11: 80 mL via INTRAVENOUS

## 2018-01-11 MED ORDER — METOCLOPRAMIDE HCL 5 MG/ML IJ SOLN
10.0000 mg | Freq: Once | INTRAMUSCULAR | Status: AC
  Administered 2018-01-11: 10 mg via INTRAVENOUS
  Filled 2018-01-11: qty 2

## 2018-01-11 MED ORDER — SODIUM CHLORIDE 0.9 % IJ SOLN
INTRAMUSCULAR | Status: AC
  Filled 2018-01-11: qty 150

## 2018-01-11 NOTE — ED Notes (Signed)
Spoke with Amy from lab, stated she would add quantitative pregnancy lab draw on.

## 2018-01-11 NOTE — ED Provider Notes (Signed)
[facility] DEPT Provider Note   CSN: [id number] Arrival date & time: 01/11/18  1816     History   Chief Complaint Chief Complaint  Patient presents with  . Headache  . Blurred Vision  . Emesis    HPI Sylvia Maxwell is a 33 y.o. female.  The history is provided by the patient. No language interpreter was used.  Headache   Associated symptoms include vomiting.  Emesis   Associated symptoms include headaches.    Sylvia Maxwell is a 32 y.o. female who presents to the Emergency Department complaining of HA, blurred vision. Presents to the emergency department for evaluation of headache and blurred vision. She has a history of alcohol abuse and have been in remission until last weekend when she began to drink. She did not drink yesterday and then she began to drink again today. No recent injuries or head injury. When she was at work today at 88 she developed sudden onset left sided posterior neck pain with associated vertiginous/dizzy sensation. Her vision went completely out for about 30 seconds and then when her vision returned it was blurry and she was seeing triple of things.  No LOC Her difficulty with vision lasted about one hour. She reports headache to the top of her head as well as ongoing dizziness. She feels numb and bilateral lower extremities. She did have three episodes of emesis. She denies any chest pain, shortness of breath, abdominal pain. She does have a burning sensation in ears bilaterally. She was recently treated for BV as well as bronchitis.  Past Medical History:  Diagnosis Date  . Alcoholism ([place])   . Bronchitis     Patient Active Problem List   Diagnosis Date Noted  . Polysubstance (including opioids) dependence with physiol dependence ([place]) 05/22/2017  . Major depressive disorder, single episode, severe ([place]) 05/22/2017  . Heroin overdose ([place]) 05/20/2017  . Alcohol dependence with withdrawal with  complication ([place]) 50/27/7412  . Alcohol withdrawal seizure ([place]) 06/13/2016  . Seizure ([place])   . ETOH abuse 06/12/2016  . Alcohol withdrawal seizure without complication ([place]) 87/86/7672    Past Surgical History:  Procedure Laterality Date  . BREAST ENHANCEMENT SURGERY  2008     OB History   None      Home Medications    Prior to Admission medications   Medication Sig Start Date End Date Taking? Authorizing Provider  Ascorbic Acid (VITAMIN C) POWD Take 5 mLs by mouth daily.    Yes [provider]  b complex vitamins tablet Take 1 tablet by mouth daily.   Yes [provider]  ibuprofen (ADVIL,MOTRIN) 200 MG tablet Take 800 mg by mouth every 4 (four) hours as needed for headache.   Yes [provider]  Methylsulfonylmethane (MSM) POWD Take 10 mLs by mouth daily.   Yes [provider]  Multiple Vitamin (MULTIVITAMIN WITH MINERALS) TABS tablet Take 1 tablet by mouth daily.   Yes [provider]  tetrahydrozoline 0.05 % ophthalmic solution Place 1 drop into both eyes 4 (four) times daily.   Yes [provider]  amoxicillin (AMOXIL) 875 MG tablet Take 1 tablet (875 mg total) by mouth 2 (two) times daily. Patient not taking: Reported on 01/11/2018 12/31/17   Robyn Haber, MD    Family History Family History  Family history unknown: Yes    Social History Social History   Tobacco Use  . Smoking status: Current Every Day Smoker    Types: Cigarettes  .  Smokeless tobacco: Never Used  Substance Use Topics  . Alcohol use: Yes    Comment: patient states she had been sober, but drank for the past 3 days  . Drug use: Not Currently    Comment: Meth, heroin      Allergies   Lactose intolerance (gi)   Review of Systems Review of Systems  Gastrointestinal: Positive for vomiting.  Neurological: Positive for headaches.  All other systems reviewed and are negative.    Physical Exam Updated Vital Signs BP 116/71 (BP  Location: Left Arm)   Pulse 97   Temp 98.2 F (36.8 C)   Resp 15   Ht 5\' 6"  (1.676 m)   Wt 61.2 kg   LMP 12/17/2017 (Approximate)   SpO2 98%   BMI 21.79 kg/m   Physical Exam  Constitutional: She is oriented to person, place, and time. She appears well-developed and well-nourished.  HENT:  Head: Normocephalic and atraumatic.  Neck: Neck supple.  Cardiovascular: Normal rate and regular rhythm.  No murmur heard. Pulmonary/Chest: Effort normal and breath sounds normal. No respiratory distress.  Abdominal: Soft. There is no tenderness. There is no rebound and no guarding.  Musculoskeletal: She exhibits no edema or tenderness.  Neurological: She is alert and oriented to person, place, and time. No cranial nerve deficit. Coordination normal.  No pronator drift. Five out of five strength in all four extremities with sensation to light touch intact in all four extremities. Visual fields are grossly intact. EOM I.  Skin: Skin is warm and dry.  Psychiatric: She has a normal mood and affect. Her behavior is normal.  Nursing note and vitals reviewed.    ED Treatments / Results  Labs (all labs ordered are listed, but only abnormal results are displayed) Labs Reviewed  COMPREHENSIVE METABOLIC PANEL - Abnormal; Notable for the following components:      Result Value   Creatinine, Ser 0.43 (*)    All other components within normal limits  ETHANOL - Abnormal; Notable for the following components:   Alcohol, Ethyl (B) 164 (*)    All other components within normal limits  HCG, QUANTITATIVE, PREGNANCY - Abnormal; Notable for the following components:   hCG, Beta Chain, Quant, S 28,086 (*)    All other components within normal limits  I-STAT BETA HCG BLOOD, ED (MC, WL, AP ONLY) - Abnormal; Notable for the following components:   I-stat hCG, quantitative >2,000.0 (*)    All other components within normal limits  I-STAT CHEM 8, ED - Abnormal; Notable for the following components:   BUN 4 (*)     Calcium, Ion 1.08 (*)    All other components within normal limits  CBC WITH DIFFERENTIAL/PLATELET  CBG MONITORING, ED    EKG None  Radiology Ct Angio Head W/cm &/or Wo Cm  Result Date: 01/11/2018 CLINICAL DATA:  32 y/o F; syncope with bilateral leg numbness and tingling. EXAM: CT ANGIOGRAPHY HEAD AND NECK TECHNIQUE: Multidetector CT imaging of the head and neck was performed using the standard protocol during bolus administration of intravenous contrast. Multiplanar CT image reconstructions and MIPs were obtained to evaluate the vascular anatomy. Carotid stenosis measurements (when applicable) are obtained utilizing NASCET criteria, using the distal internal carotid diameter as the denominator. CONTRAST:  80 cc Isovue 370 COMPARISON:  06/12/2016 CT head FINDINGS: CT HEAD FINDINGS Brain: No evidence of acute infarction, hemorrhage, hydrocephalus, extra-axial collection or mass lesion/mass effect. Vascular: No hyperdense vessel or unexpected calcification. Skull: Normal. Negative for fracture or focal lesion.  Sinuses: Imaged portions are clear. Orbits: No acute finding. Review of the MIP images confirms the above findings CTA NECK FINDINGS Aortic arch: Standard branching. Imaged portion shows no evidence of aneurysm or dissection. No significant stenosis of the major arch vessel origins. Right carotid system: No evidence of dissection, stenosis (50% or greater) or occlusion. Left carotid system: No evidence of dissection, stenosis (50% or greater) or occlusion. Vertebral arteries: Codominant. No evidence of dissection, stenosis (50% or greater) or occlusion. Skeleton: Straightening of cervical lordosis without listhesis. Other neck: Negative. Upper chest: Paraseptal emphysema of the lung apices. Review of the MIP images confirms the above findings CTA HEAD FINDINGS Anterior circulation: No significant stenosis, proximal occlusion, aneurysm, or vascular malformation. Posterior circulation: No  significant stenosis, proximal occlusion, aneurysm, or vascular malformation. Venous sinuses: As permitted by contrast timing, patent. Anatomic variants: Anterior communicating and right posterior communicating arteries are patent. Left posterior communicating artery not identified, likely hypoplastic or absent. Delayed phase: No abnormal intracranial enhancement. Review of the MIP images confirms the above findings IMPRESSION: 1. No acute intracranial abnormality identified. Unremarkable CT of the head. 2. Patent carotid and vertebral arteries. No dissection, aneurysm, or hemodynamically significant stenosis utilizing NASCET criteria. 3. Patent anterior and posterior intracranial circulation. No large vessel occlusion, aneurysm, or significant stenosis. 4. Paraseptal emphysema of the lung apices. Emphysema (ICD10-J43.9). These results were called by telephone at the time of interpretation on 01/11/2018 at 10:15 pm to Dr. Quintella Reichert , who verbally acknowledged these results. Electronically Signed   By: Kristine Garbe M.D.   On: 01/11/2018 22:18   Ct Angio Neck W And/or Wo Contrast  Result Date: 01/11/2018 CLINICAL DATA:  32 y/o F; syncope with bilateral leg numbness and tingling. EXAM: CT ANGIOGRAPHY HEAD AND NECK TECHNIQUE: Multidetector CT imaging of the head and neck was performed using the standard protocol during bolus administration of intravenous contrast. Multiplanar CT image reconstructions and MIPs were obtained to evaluate the vascular anatomy. Carotid stenosis measurements (when applicable) are obtained utilizing NASCET criteria, using the distal internal carotid diameter as the denominator. CONTRAST:  80 cc Isovue 370 COMPARISON:  06/12/2016 CT head FINDINGS: CT HEAD FINDINGS Brain: No evidence of acute infarction, hemorrhage, hydrocephalus, extra-axial collection or mass lesion/mass effect. Vascular: No hyperdense vessel or unexpected calcification. Skull: Normal. Negative for fracture  or focal lesion. Sinuses: Imaged portions are clear. Orbits: No acute finding. Review of the MIP images confirms the above findings CTA NECK FINDINGS Aortic arch: Standard branching. Imaged portion shows no evidence of aneurysm or dissection. No significant stenosis of the major arch vessel origins. Right carotid system: No evidence of dissection, stenosis (50% or greater) or occlusion. Left carotid system: No evidence of dissection, stenosis (50% or greater) or occlusion. Vertebral arteries: Codominant. No evidence of dissection, stenosis (50% or greater) or occlusion. Skeleton: Straightening of cervical lordosis without listhesis. Other neck: Negative. Upper chest: Paraseptal emphysema of the lung apices. Review of the MIP images confirms the above findings CTA HEAD FINDINGS Anterior circulation: No significant stenosis, proximal occlusion, aneurysm, or vascular malformation. Posterior circulation: No significant stenosis, proximal occlusion, aneurysm, or vascular malformation. Venous sinuses: As permitted by contrast timing, patent. Anatomic variants: Anterior communicating and right posterior communicating arteries are patent. Left posterior communicating artery not identified, likely hypoplastic or absent. Delayed phase: No abnormal intracranial enhancement. Review of the MIP images confirms the above findings IMPRESSION: 1. No acute intracranial abnormality identified. Unremarkable CT of the head. 2. Patent carotid and vertebral arteries. No dissection, aneurysm,  or hemodynamically significant stenosis utilizing NASCET criteria. 3. Patent anterior and posterior intracranial circulation. No large vessel occlusion, aneurysm, or significant stenosis. 4. Paraseptal emphysema of the lung apices. Emphysema (ICD10-J43.9). These results were called by telephone at the time of interpretation on 01/11/2018 at 10:15 pm to Dr. Quintella Reichert , who verbally acknowledged these results. Electronically Signed   By: Kristine Garbe M.D.   On: 01/11/2018 22:18    Procedures Procedures (including critical care time) EMERGENCY DEPARTMENT [place] PREGNANCY "Study: Limited Ultrasound of the Pelvis for Pregnancy"  INDICATIONS:dizziness, pregnancy Multiple views of the uterus and pelvic cavity were obtained in real-time with a multi-frequency probe.  APPROACH:Transabdominal  PERFORMED BY: Myself IMAGES ARCHIVED?: Yes LIMITATIONS: Decompressed bladder PREGNANCY FREE FLUID: Present, small amount ADNEXAL FINDINGS: none GESTATIONAL AGE, ESTIMATE: not performed FETAL HEART RATE: not performed INTERPRETATION: gestational sac present with irregular yolk sac.      Medications Ordered in ED Medications  iopamidol (ISOVUE-370) 76 % injection (has no administration in time range)  sodium chloride 0.9 % injection (has no administration in time range)  iopamidol (ISOVUE-370) 76 % injection 80 mL (80 mLs Intravenous Contrast Given 01/11/18 2115)  metoCLOPramide (REGLAN) injection 10 mg (10 mg Intravenous Given 01/11/18 2347)  sodium chloride 0.9 % bolus 1,000 mL (0 mLs Intravenous Stopped 01/12/18 0106)     Initial Impression / Assessment and Plan / ED Course  I have reviewed the triage vital signs and the nursing notes.  Pertinent labs & imaging results that were available during my care of the patient were reviewed by me and considered in my medical decision making (see chart for details).     Patient here for evaluation of severe left-sided neck pain, headache, vision changes. Overall her vision changes have resolved but she does have a headache that persists. She has a history of alcohol abuse, no additional medical problems. She is neurologically intact on examination. Initial concern for vertebral dissection and CTA obtained. CT is negative for dissection. Labs do note that she is pregnant. Patient is unclear of her last menstrual cycle, believes it was one month ago. She has no prior pregnancies. She has no  abdominal pain, or vaginally bleeding. Headache an episode that presented today does not sound like a syncopal event. Bedside ultrasound was in determinants, demonstrating a gestational sac with irregular yolk sac, no fetal cardiac activity. Discussed with patient indeterminate findings on bedside ultrasound, offered formal ultrasound patient declines at this time. Plan to discharge home with outpatient follow-up as well as close return precautions if she develops any recurrent symptoms. Discussed starting prenatal vitamin and discontinuing alcohol use. Final Clinical Impressions(s) / ED Diagnoses   Final diagnoses:  Bad headache  Neck pain  Early stage of pregnancy    ED Discharge Orders    None       Quintella Reichert, MD 01/12/18 0140

## 2018-01-11 NOTE — ED Triage Notes (Addendum)
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 Patient states she then began seeing three of everything and then had 3 episodes of vomiting. Patient continues to c/o dizziness. Patient reported at the end of triage that she recently started drinking alcohol again and has been drinking x 3 days.

## 2018-01-12 LAB — HCG, QUANTITATIVE, PREGNANCY: HCG, BETA CHAIN, QUANT, S: 28086 m[IU]/mL — AB (ref <5)

## 2018-01-12 NOTE — Discharge Instructions (Addendum)
Your pregnancy test was positive today. You're very early along in your pregnancy and it is too early to say if it is healthy or located in the correct place. It is very important to get rechecked immediately if you develop abdominal pain, vaginal bleeding, lightheadedness or any new concerning symptoms.  Start taking a prenatal vitamin, available over the counter.

## 2018-01-18 ENCOUNTER — Encounter (HOSPITAL_COMMUNITY): Payer: Self-pay

## 2018-01-18 ENCOUNTER — Inpatient Hospital Stay (HOSPITAL_COMMUNITY): Payer: Self-pay

## 2018-01-18 ENCOUNTER — Inpatient Hospital Stay (HOSPITAL_COMMUNITY)
Admission: AD | Admit: 2018-01-18 | Discharge: 2018-01-18 | Disposition: A | Discharge status: Home / Self Care | Payer: Self-pay | Source: Ambulatory Visit | Attending: Obstetrics & Gynecology | Admitting: Obstetrics & Gynecology

## 2018-01-18 DIAGNOSIS — O4690 Antepartum hemorrhage, unspecified, unspecified trimester: Secondary | ICD-10-CM

## 2018-01-18 DIAGNOSIS — O99331 Smoking (tobacco) complicating pregnancy, first trimester: Secondary | ICD-10-CM | POA: Insufficient documentation

## 2018-01-18 DIAGNOSIS — Z3A01 Less than 8 weeks gestation of pregnancy: Secondary | ICD-10-CM | POA: Insufficient documentation

## 2018-01-18 DIAGNOSIS — O209 Hemorrhage in early pregnancy, unspecified: Secondary | ICD-10-CM | POA: Insufficient documentation

## 2018-01-18 DIAGNOSIS — F17210 Nicotine dependence, cigarettes, uncomplicated: Secondary | ICD-10-CM | POA: Insufficient documentation

## 2018-01-18 DIAGNOSIS — O208 Other hemorrhage in early pregnancy: Secondary | ICD-10-CM

## 2018-01-18 LAB — URINALYSIS, ROUTINE W REFLEX MICROSCOPIC
Bilirubin Urine: NEGATIVE
Glucose, UA: NEGATIVE mg/dL
Ketones, ur: NEGATIVE mg/dL
Leukocytes, UA: NEGATIVE
Nitrite: NEGATIVE
Protein, ur: NEGATIVE mg/dL
SPECIFIC GRAVITY, URINE: 1.023 (ref 1.005–1.030)
pH: 6 (ref 5.0–8.0)

## 2018-01-18 LAB — CBC
HCT: 38.2 % (ref 36.0–46.0)
HEMOGLOBIN: 13.3 g/dL (ref 12.0–15.0)
MCH: 31.7 pg (ref 26.0–34.0)
MCHC: 34.8 g/dL (ref 30.0–36.0)
MCV: 91 fL (ref 80.0–100.0)
NRBC: 0 % (ref 0.0–0.2)
Platelets: 183 10*3/uL (ref 150–400)
RBC: 4.2 MIL/uL (ref 3.87–5.11)
RDW: 12.5 % (ref 11.5–15.5)
WBC: 9.5 10*3/uL (ref 4.0–10.5)

## 2018-01-18 LAB — ABO/RH: ABO/RH(D): O POS

## 2018-01-18 LAB — HCG, QUANTITATIVE, PREGNANCY: HCG, BETA CHAIN, QUANT, S: 105679 m[IU]/mL — AB (ref <5)

## 2018-01-18 NOTE — MAU Provider Note (Signed)
History     CSN: [id number]  Arrival date and time: 01/18/18 1332   First Provider Initiated Contact with Patient 01/18/18 1455      Chief Complaint  Patient presents with  . Abdominal Pain  . Vaginal Bleeding   Patient presents due to vaginal bleeding. She is currently [redacted] weeks pregnant, which was diagnosed at a recent visit to the ER. At that time, bedside US did not show fetal heart tones.  Per chart review gestational sac and irregular yolk sac were visualized. She has had light bleeding daily since that time that is about enough to "ruin underwear" but not enough to require a pad. She also has mild abdominal pain in the lower abdomen. No severe abdominal pain. No radiation to flanks or inguinal regions. She reports that she is surprised to be pregnant, because she didn't think it was possible, and is wondering whether or not her pregnancy is still viable given the ongoing bleeding. She denies passing blood clots or tissue.    Past Medical History:  Diagnosis Date  . Alcoholism ([place])   . Bronchitis     Past Surgical History:  Procedure Laterality Date  . BREAST ENHANCEMENT SURGERY  2008    Family History  Family history unknown: Yes    Social History   Tobacco Use  . Smoking status: Current Every Day Smoker    Types: Cigarettes  . Smokeless tobacco: Never Used  Substance Use Topics  . Alcohol use: Yes    Comment: patient states she had been sober, but drank for the past 3 days  . Drug use: Not Currently    Comment: Meth, heroin 05/2017    Allergies:  Allergies  Allergen Reactions  . Lactose Intolerance (Gi) Diarrhea and Nausea And Vomiting    Depends on the type of dairy-based product consumed as to which reaction occurs    No medications prior to admission.    Review of Systems  Constitutional: Negative for fatigue and fever.  Gastrointestinal: Negative for blood in stool, nausea and vomiting.  Genitourinary: Negative for difficulty urinating, dysuria,  frequency and urgency.   Physical Exam   Blood pressure 103/66, pulse (!) 57, temperature 98.6 F (37 C), temperature source Oral, resp. rate 18, weight 61.1 kg, last menstrual period 12/11/2017, SpO2 100 %.  Physical Exam  Constitutional: She is oriented to person, place, and time. She appears well-developed and well-nourished. No distress.  HENT:  Head: Normocephalic and atraumatic.  Right Ear: External ear normal.  Left Ear: External ear normal.  Mouth/Throat: Oropharynx is clear and moist.  Eyes: Right eye exhibits no discharge. Left eye exhibits no discharge. No scleral icterus.  Cardiovascular: Normal rate, regular rhythm and normal heart sounds. Exam reveals no gallop and no friction rub.  No murmur heard. Respiratory: Effort normal and breath sounds normal. No respiratory distress. She has no wheezes. She has no rales.  GI: Soft. Bowel sounds are normal. She exhibits no mass. There is tenderness (mild lower abdominal tenderness). There is no rebound and no guarding.  Neurological: She is alert and oriented to person, place, and time. No cranial nerve deficit.  Skin: Skin is warm and dry. No rash noted. She is not diaphoretic. No erythema.  Psychiatric: She has a normal mood and affect. Her behavior is normal. Judgment and thought content normal.    MAU Course  Procedures  MDM Patient with early pregnancy and vaginal bleeding Obtained CBC, ABO/rh, [place] and hcg CBC within normal limits Rh positive Hcg increasing  appropriately from prior Preliminary [place] report shows fetal pole with cardiac activity and small subchorionic hemorrhage All results discussed with patient and partner  Assessment and Plan  Vaginal bleeding in early pregnancy with appropriate increase in hcg as well as [place] today showing viable IUP.  Counseling provided to patient and partner regarding all results. Encouraged to avoid teratogens and start PNV. Establish care with Ob provider if she intends to continue  the pregnancy. Provided a list of providers in the area. Return/ER precautions provided. Follow up PRN. Patient understands and agrees to the discharge plan.   Aura Camps 01/18/2018, 6:02 PM

## 2018-01-18 NOTE — Discharge Instructions (Signed)
Subchorionic Hematoma A subchorionic hematoma is a gathering of blood between the outer wall of the placenta and the inner wall of the womb (uterus). The placenta is the organ that connects the fetus to the wall of the uterus. The placenta performs the feeding, breathing (oxygen to the fetus), and waste removal (excretory work) of the fetus. Subchorionic hematoma is the most common abnormality found on a result from ultrasonography done during the first trimester or early second trimester of pregnancy. If there has been little or no vaginal bleeding, early small hematomas usually shrink on their own and do not affect your baby or pregnancy. The blood is gradually absorbed over 1-2 weeks. When bleeding starts later in pregnancy or the hematoma is larger or occurs in an older pregnant woman, the outcome may not be as good. Larger hematomas may get bigger, which increases the chances for miscarriage. Subchorionic hematoma also increases the risk of premature detachment of the placenta from the uterus, preterm (premature) labor, and stillbirth. Follow these instructions at home:  Stay on bed rest if your health care provider recommends this. Although bed rest will not prevent more bleeding or prevent a miscarriage, your health care provider may recommend bed rest until you are advised otherwise.  Avoid heavy lifting (more than 10 lb [4.5 kg]), exercise, sexual intercourse, or douching as directed by your health care provider.  Keep track of the number of pads you use each day and how soaked (saturated) they are. Write down this information.  Do not use tampons.  Keep all follow-up appointments as directed by your health care provider. Your health care provider may ask you to have follow-up blood tests or ultrasound tests or both. Get help right away if:  You have severe cramps in your stomach, back, abdomen, or pelvis.  You have a fever.  You pass large clots or tissue. Save any tissue for your  health care provider to look at.  Your bleeding increases or you become lightheaded, feel weak, or have fainting episodes. This information is not intended to replace advice given to you by your health care provider. Make sure you discuss any questions you have with your health care provider. Document Released: 07/15/2006 Document Revised: 09/05/2015 Document Reviewed: 10/27/2012 Elsevier Interactive Patient Education  2017 [place] for [place] for [place] @ [facility]   Phone: [phone]  Center for [place] @ [place]   Phone: [place] @[place]  [facility]       Phone: [place] for [place] @ [place]     Phone: [place] for [place] @ [employer]   Phone: [place] for [place] @ [facility]  Phone: [place])  Phone: [place] for [place] at [facility]       Phone: [phone]  Center for [place] at [place] Phone: [place] for [place] at [place]  Phone: [place] for [place] at [facility]  Phone: [place] for [place] at [place]  Phone: [facility]       Phone: [phone]  [facility]    Phone: [place]  [facility] Linna Hoff)    Phone: [facility]    Phone: [phone]  [facility]    Phone: [phone]  [place]    Phone: [phone]  [place] [facility]       Phone: [phone]   [place] [facility]  Phone: [place]    Phone:  [phone]  [facility]   Phone: [phone]  [facility]      Phone: [phone]  [facility]    Phone: [phone]

## 2018-01-18 NOTE — MAU Note (Signed)
Found out she was pregnant last week in [facility]. hcg 28000  Cramping and bleeding since last week  Sees blood in underwear but not wearing a pad

## 2018-03-07 ENCOUNTER — Ambulatory Visit (INDEPENDENT_AMBULATORY_CARE_PROVIDER_SITE_OTHER): Payer: Self-pay | Admitting: Advanced Practice Midwife

## 2018-03-07 ENCOUNTER — Ambulatory Visit: Payer: Self-pay | Admitting: Clinical

## 2018-03-07 ENCOUNTER — Encounter: Payer: Self-pay | Admitting: Advanced Practice Midwife

## 2018-03-07 VITALS — BP 99/69 | HR 73 | Wt 150.5 lb

## 2018-03-07 DIAGNOSIS — Z3A12 12 weeks gestation of pregnancy: Secondary | ICD-10-CM

## 2018-03-07 DIAGNOSIS — O039 Complete or unspecified spontaneous abortion without complication: Secondary | ICD-10-CM

## 2018-03-07 MED ORDER — IBUPROFEN 800 MG PO TABS: 800.0000 mg | ORAL_TABLET | Freq: Three times a day (TID) | ORAL | 0 refills | Status: DC | PRN

## 2018-03-07 MED ORDER — MISOPROSTOL 200 MCG PO TABS: 800.0000 ug | ORAL_TABLET | Freq: Once | ORAL | 0 refills | Status: DC

## 2018-03-07 MED ORDER — ONDANSETRON 8 MG PO TBDP: 8.0000 mg | ORAL_TABLET | Freq: Three times a day (TID) | ORAL | 0 refills | Status: DC | PRN

## 2018-03-07 NOTE — Progress Notes (Signed)
Subjective:     Patient ID: Sylvia Maxwell, female   DOB: 1985-07-28, 32 y.o.   MRN: [id number]  Sylvia Maxwell is a 32 y.o. G1P0 at [redacted]w[redacted]d by LMP and early [place]. Here today for NOB visit. Unable to doppler FHT and no fetal pole seen on abdominal ultrasound. Patient has had some spotting off and on. She also reports pain during intercourse, but denies cramping.   Vaginal Bleeding  The patient's primary symptoms include vaginal bleeding. This is a new problem. The current episode started in the past 7 days. The problem occurs intermittently. The problem has been unchanged. The patient is experiencing no pain. She is pregnant. The vaginal bleeding is spotting. She has not been passing clots. She has not been passing tissue. Nothing aggravates the symptoms. She has tried nothing for the symptoms. She is sexually active.     Review of Systems  Genitourinary: Positive for vaginal bleeding.  All other systems reviewed and are negative.      Objective:   Physical Exam  Constitutional: She appears well-developed and well-nourished. No distress.  HENT:  Head: Normocephalic.  Cardiovascular: Normal rate.  Pulmonary/Chest: Effort normal.  Abdominal: Soft. There is no tenderness.  Skin: Skin is warm and dry.  Psychiatric: She has a normal mood and affect.  Nursing note and vitals reviewed.  Limited abdominal US: unable to see fetal pole or cardiac activity. Gestational sac with large yolk sac seen.  Attempted [place] on our formal US machine. No transvaginal done, but gestational sac with large yolk sac and no fetal pole again seen. Consult with Dr. Roselie Awkward because she has good dating (LMP and 5 week 5 day [place]), and fetus should be clearly visible at 12 weeks patient does not need transvaginal US, and we can treated as a missed ab at this time. Patient can be offered cytotec.    Assessment:   1. SAB (spontaneous abortion)    Plan:  Reviewed options with the patient. Patient will  proceed with Cytotec for management of MAB Bleeding precautions and anticapory guidance provided to the patient and her family member Return to office in 2 weeks for FU or sooner for concerns.  Patient aware she can go to MAU for heavier than expected bleeding if needed  Allergies as of 03/07/2018      Reactions   Lactose Intolerance (gi) Diarrhea, Nausea And Vomiting   Depends on the type of dairy-based product consumed as to which reaction occurs      Medication List        Accurate as of 03/07/18 10:43 AM. Always use your most recent med list.          acetaminophen 325 MG tablet Commonly known as:  TYLENOL Take 975 mg by mouth every 6 (six) hours as needed for mild pain or headache.   ibuprofen 800 MG tablet Commonly known as:  ADVIL,MOTRIN Take 1 tablet (800 mg total) by mouth every 8 (eight) hours as needed.   loratadine 10 MG tablet Commonly known as:  CLARITIN Take 10 mg by mouth daily.   misoprostol 200 MCG tablet Commonly known as:  CYTOTEC Take 4 tablets (800 mcg total) by mouth once for 1 dose. May repeat in 24 hours if no bleeding occurs from first dose   MSM Powd Take 10 mLs by mouth daily.   multivitamin with minerals Tabs tablet Take 1 tablet by mouth daily.   multivitamin-prenatal 27-0.8 MG Tabs tablet Take 1 tablet by mouth daily at 12  noon.   ondansetron 8 MG disintegrating tablet Commonly known as:  ZOFRAN-ODT Take 1 tablet (8 mg total) by mouth every 8 (eight) hours as needed for nausea or vomiting.   Vitamin C Powd Take 1 mL by mouth daily.       Sylvia Maxwell 10:43 AM 03/07/18

## 2018-03-07 NOTE — BH Specialist Note (Signed)
error 

## 2018-03-07 NOTE — Patient Instructions (Signed)

## 2018-03-12 ENCOUNTER — Emergency Department (HOSPITAL_COMMUNITY)
Admission: EM | Admit: 2018-03-12 | Discharge: 2018-03-12 | Disposition: A | Discharge status: Home / Self Care | Payer: Self-pay | Attending: Emergency Medicine | Admitting: Emergency Medicine

## 2018-03-12 ENCOUNTER — Other Ambulatory Visit: Payer: Self-pay

## 2018-03-12 ENCOUNTER — Encounter (HOSPITAL_COMMUNITY): Payer: Self-pay

## 2018-03-12 DIAGNOSIS — F10920 Alcohol use, unspecified with intoxication, uncomplicated: Secondary | ICD-10-CM | POA: Insufficient documentation

## 2018-03-12 DIAGNOSIS — F17210 Nicotine dependence, cigarettes, uncomplicated: Secondary | ICD-10-CM | POA: Insufficient documentation

## 2018-03-12 DIAGNOSIS — R112 Nausea with vomiting, unspecified: Secondary | ICD-10-CM | POA: Insufficient documentation

## 2018-03-12 DIAGNOSIS — Z79899 Other long term (current) drug therapy: Secondary | ICD-10-CM | POA: Insufficient documentation

## 2018-03-12 LAB — URINALYSIS, ROUTINE W REFLEX MICROSCOPIC
BACTERIA UA: NONE SEEN
Bilirubin Urine: NEGATIVE
Glucose, UA: NEGATIVE mg/dL
Ketones, ur: 80 mg/dL — AB
Leukocytes, UA: NEGATIVE
NITRITE: NEGATIVE
Protein, ur: 100 mg/dL — AB
RBC / HPF: >50 RBC/hpf — ABNORMAL HIGH (ref 0–5)
Specific Gravity, Urine: 1.021 (ref 1.005–1.030)
pH: 6 (ref 5.0–8.0)

## 2018-03-12 LAB — COMPREHENSIVE METABOLIC PANEL
ALBUMIN: 4.3 g/dL (ref 3.5–5.0)
ALT: 24 U/L (ref 0–44)
AST: 32 U/L (ref 15–41)
Alkaline Phosphatase: 84 U/L (ref 38–126)
Anion gap: 16 — ABNORMAL HIGH (ref 5–15)
BUN: 9 mg/dL (ref 6–20)
CO2: 22 mmol/L (ref 22–32)
Calcium: 8.5 mg/dL — ABNORMAL LOW (ref 8.9–10.3)
Chloride: 101 mmol/L (ref 98–111)
Creatinine, Ser: 0.44 mg/dL (ref 0.44–1.00)
GFR calc Af Amer: >60 mL/min (ref >60)
GFR calc non Af Amer: >60 mL/min (ref >60)
GLUCOSE: 92 mg/dL (ref 70–99)
Potassium: 3.8 mmol/L (ref 3.5–5.1)
SODIUM: 139 mmol/L (ref 135–145)
Total Bilirubin: 0.8 mg/dL (ref 0.3–1.2)
Total Protein: 8 g/dL (ref 6.5–8.1)

## 2018-03-12 LAB — ETHANOL: ALCOHOL ETHYL (B): 360 mg/dL — AB (ref <10)

## 2018-03-12 LAB — CBC WITH DIFFERENTIAL/PLATELET
Abs Immature Granulocytes: 0.04 10*3/uL (ref 0.00–0.07)
Basophils Absolute: 0 10*3/uL (ref 0.0–0.1)
Basophils Relative: 0 %
Eosinophils Absolute: 0 10*3/uL (ref 0.0–0.5)
Eosinophils Relative: 0 %
HCT: 44.6 % (ref 36.0–46.0)
Hemoglobin: 15.3 g/dL — ABNORMAL HIGH (ref 12.0–15.0)
Immature Granulocytes: 1 %
Lymphocytes Relative: 36 %
Lymphs Abs: 3.2 10*3/uL (ref 0.7–4.0)
MCH: 31.5 pg (ref 26.0–34.0)
MCHC: 34.3 g/dL (ref 30.0–36.0)
MCV: 92 fL (ref 80.0–100.0)
Monocytes Absolute: 0.3 10*3/uL (ref 0.1–1.0)
Monocytes Relative: 4 %
Neutro Abs: 5.3 10*3/uL (ref 1.7–7.7)
Neutrophils Relative %: 59 %
Platelets: 198 10*3/uL (ref 150–400)
RBC: 4.85 MIL/uL (ref 3.87–5.11)
RDW: 12.8 % (ref 11.5–15.5)
WBC: 8.9 10*3/uL (ref 4.0–10.5)
nRBC: 0 % (ref 0.0–0.2)

## 2018-03-12 LAB — RAPID URINE DRUG SCREEN, HOSP PERFORMED
AMPHETAMINES: NOT DETECTED
Barbiturates: NOT DETECTED
Benzodiazepines: NOT DETECTED
Cocaine: NOT DETECTED
Opiates: NOT DETECTED
Tetrahydrocannabinol: NOT DETECTED

## 2018-03-12 LAB — HCG, QUANTITATIVE, PREGNANCY: HCG, BETA CHAIN, QUANT, S: 2514 m[IU]/mL — AB (ref <5)

## 2018-03-12 MED ORDER — LORAZEPAM 2 MG/ML IJ SOLN
1.0000 mg | Freq: Once | INTRAMUSCULAR | Status: AC
  Administered 2018-03-12: 1 mg via INTRAVENOUS
  Filled 2018-03-12: qty 1

## 2018-03-12 MED ORDER — SODIUM CHLORIDE 0.9 % IV BOLUS
1000.0000 mL | Freq: Once | INTRAVENOUS | Status: AC
  Administered 2018-03-12: 1000 mL via INTRAVENOUS

## 2018-03-12 MED ORDER — ONDANSETRON HCL 4 MG/2ML IJ SOLN
4.0000 mg | Freq: Once | INTRAMUSCULAR | Status: AC
  Administered 2018-03-12: 4 mg via INTRAVENOUS
  Filled 2018-03-12: qty 2

## 2018-03-12 NOTE — Discharge Instructions (Addendum)
Follow-up with your primary doctor if you experience any additional problems.
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
## 2018-03-12 NOTE — ED Notes (Signed)
Pt aware that we need urine tech ask x2

## 2018-03-12 NOTE — ED Notes (Signed)
Date and time results received: 03/12/18 1801 (use smartphrase ".now" to insert current time)  Test: alcohol Critical Value: 360  Name of Provider Notified: Veryl Speak  Orders Received? Or Actions Taken?: Actions Taken: report alcohol to [facility]

## 2018-03-21 ENCOUNTER — Encounter: Payer: Self-pay | Admitting: Advanced Practice Midwife

## 2018-03-21 ENCOUNTER — Ambulatory Visit (INDEPENDENT_AMBULATORY_CARE_PROVIDER_SITE_OTHER): Payer: BLUE CROSS/BLUE SHIELD | Admitting: Advanced Practice Midwife

## 2018-03-21 VITALS — BP 130/74 | HR 76 | Wt 147.0 lb

## 2018-03-21 DIAGNOSIS — O039 Complete or unspecified spontaneous abortion without complication: Secondary | ICD-10-CM | POA: Diagnosis not present

## 2018-03-21 NOTE — Patient Instructions (Signed)

## 2018-03-21 NOTE — Progress Notes (Signed)
  Subjective:     Patient ID: Sylvia Maxwell, female   DOB: [birth year], 32 y.o.   MRN: [id number]  Sylvia Maxwell is a 32 y.o. G1P0 who is here today for SAB follow up. She was seen on 03/07/18, and given cytotec. She states that she took this on 03/09/18. She states that for about 4 days after taking it she had a lot of heavy bleeding. She states that now for the last few days the bleeding has decreased to the level of about like a period. She is also still having a lot of pain. She is using ibuprofen for pain.  Vaginal Bleeding  The patient's primary symptoms include vaginal bleeding. The patient's pertinent negatives include no pelvic pain. This is a new problem. The current episode started 1 to 4 weeks ago. The problem has been gradually improving. The pain is moderate. The problem affects both sides. Associated symptoms include nausea. Pertinent negatives include no chills, dysuria, fever or vomiting. The vaginal discharge was bloody. The vaginal bleeding is typical of menses. She has been passing clots.     Review of Systems  Constitutional: Negative for chills and fever.  Gastrointestinal: Positive for nausea. Negative for vomiting.  Genitourinary: Positive for vaginal bleeding. Negative for dysuria and pelvic pain.       Objective:   Physical Exam  Constitutional: She is oriented to person, place, and time. She appears well-developed and well-nourished. No distress.  HENT:  Head: Normocephalic.  Cardiovascular: Normal rate.  Pulmonary/Chest: Effort normal.  Abdominal: Soft.  Genitourinary:  Genitourinary Comments: External: no lesion Vagina: small amount of blood in the vagina  Cervix: pink, smooth, no CMT Uterus: NSSC Adnexa: NT   Neurological: She is oriented to person, place, and time.  Skin: Skin is warm and dry.  Psychiatric: She has a normal mood and affect.  Nursing note and vitals reviewed.      Assessment:   1. SAB (spontaneous abortion)     Plan:  Return in about 1 week (around 03/28/2018) for non-stat hcg & appt with Roselyn Reef .  HCG and CBC today  Reassured that bleeding is normal, and not worrisome at this time. Will continue to improve   Patient encouraged to see Roselyn Reef. She doesn't want to see her today

## 2018-03-21 NOTE — Progress Notes (Signed)
error 

## 2018-03-22 LAB — CBC
Hematocrit: 32.1 % — ABNORMAL LOW (ref 34.0–46.6)
Hemoglobin: 10.7 g/dL — ABNORMAL LOW (ref 11.1–15.9)
MCH: 31.6 pg (ref 26.6–33.0)
MCHC: 33.3 g/dL (ref 31.5–35.7)
MCV: 95 fL (ref 79–97)
Platelets: 148 10*3/uL — ABNORMAL LOW (ref 150–450)
RBC: 3.39 x10E6/uL — ABNORMAL LOW (ref 3.77–5.28)
RDW: 12.4 % (ref 12.3–15.4)
WBC: 4.2 10*3/uL (ref 3.4–10.8)

## 2018-03-22 LAB — BETA HCG QUANT (REF LAB): hCG Quant: 100 m[IU]/mL
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
## 2018-03-28 ENCOUNTER — Other Ambulatory Visit: Payer: BLUE CROSS/BLUE SHIELD

## 2018-03-28 ENCOUNTER — Ambulatory Visit (INDEPENDENT_AMBULATORY_CARE_PROVIDER_SITE_OTHER): Payer: Self-pay | Admitting: Clinical

## 2018-03-28 ENCOUNTER — Other Ambulatory Visit: Payer: Self-pay | Admitting: General Practice

## 2018-03-28 DIAGNOSIS — O039 Complete or unspecified spontaneous abortion without complication: Secondary | ICD-10-CM

## 2018-03-28 DIAGNOSIS — F4321 Adjustment disorder with depressed mood: Secondary | ICD-10-CM

## 2018-03-29 LAB — BETA HCG QUANT (REF LAB): hCG Quant: 29 m[IU]/mL

## 2018-03-30 ENCOUNTER — Telehealth: Payer: Self-pay | Admitting: *Deleted

## 2018-03-30 NOTE — Telephone Encounter (Signed)
-----   Message from Tresea Mall, CNM sent at 03/29/2018  4:03 PM EST ----- HCG is continuing to trend down. Please call patient, and have her return in one week for non-stat hcg. That will likely be the last one we will need.

## 2018-03-30 NOTE — Telephone Encounter (Signed)
I called [facility]el per Heather's request and left a message I am calling with some information and because you are on Mychart - will send a message via MyChart- please call us or message [place] if you have a question. MyChart message sent.

## 2018-04-10 ENCOUNTER — Inpatient Hospital Stay (HOSPITAL_COMMUNITY): Payer: BLUE CROSS/BLUE SHIELD

## 2018-04-10 ENCOUNTER — Inpatient Hospital Stay (HOSPITAL_COMMUNITY)
Admission: AD | Admit: 2018-04-10 | Discharge: 2018-04-11 | Disposition: A | Discharge status: Home / Self Care | Payer: BLUE CROSS/BLUE SHIELD | Source: Ambulatory Visit | Attending: Obstetrics and Gynecology | Admitting: Obstetrics and Gynecology

## 2018-04-10 DIAGNOSIS — F17210 Nicotine dependence, cigarettes, uncomplicated: Secondary | ICD-10-CM | POA: Diagnosis not present

## 2018-04-10 DIAGNOSIS — E739 Lactose intolerance, unspecified: Secondary | ICD-10-CM | POA: Diagnosis not present

## 2018-04-10 DIAGNOSIS — O034 Incomplete spontaneous abortion without complication: Secondary | ICD-10-CM | POA: Insufficient documentation

## 2018-04-10 DIAGNOSIS — Z79899 Other long term (current) drug therapy: Secondary | ICD-10-CM | POA: Insufficient documentation

## 2018-04-10 LAB — HCG, QUANTITATIVE, PREGNANCY: hCG, Beta Chain, Quant, S: 19 m[IU]/mL — ABNORMAL HIGH (ref <5)

## 2018-04-10 LAB — CBC
HCT: 40.2 % (ref 36.0–46.0)
Hemoglobin: 13.4 g/dL (ref 12.0–15.0)
MCH: 32.4 pg (ref 26.0–34.0)
MCHC: 33.3 g/dL (ref 30.0–36.0)
MCV: 97.3 fL (ref 80.0–100.0)
Platelets: 136 10*3/uL — ABNORMAL LOW (ref 150–400)
RBC: 4.13 MIL/uL (ref 3.87–5.11)
RDW: 14.6 % (ref 11.5–15.5)
WBC: 5.9 10*3/uL (ref 4.0–10.5)
nRBC: 0 % (ref 0.0–0.2)

## 2018-04-10 MED ORDER — KETOROLAC TROMETHAMINE 60 MG/2ML IM SOLN
60.0000 mg | Freq: Once | INTRAMUSCULAR | Status: AC
  Administered 2018-04-10: 60 mg via INTRAMUSCULAR
  Filled 2018-04-10: qty 2

## 2018-04-10 NOTE — MAU Provider Note (Signed)
History    CSN: [id number]  Arrival date and time: 04/10/18 2054   First Provider Initiated Contact with Patient 04/10/18 2133     Chief Complaint  Patient presents with  . Abdominal Pain  . Vaginal Bleeding   HPI Sylvia Maxwell is a 32 y.o. G1P0 at Unknown who presents with a sudden onset of vaginal bleeding and abdominal pain. She states the pain is mostly on the left side but also in her lower abdomen. She rates the pain a 10/10 and has not tried anything for the pain. She reports soaking 3 maxi pads and passing golf ball sized clots. She was given cytotec for a missed AB in November and was coming for follow up [place] that were trending down in December. She states she continued to have unprotected intercourse throughout the last 3 weeks.   OB History    Gravida  1   Para      Term      Preterm      AB      Living        SAB      TAB      Ectopic      Multiple      Live Births              Past Medical History:  Diagnosis Date  . Alcoholism ([place])   . Bronchitis   . Depression   . Seizures ([place])     Past Surgical History:  Procedure Laterality Date  . BREAST ENHANCEMENT SURGERY  2008    Family History  Family history unknown: Yes    Social History   Tobacco Use  . Smoking status: Current Every Day Smoker    Types: Cigarettes  . Smokeless tobacco: Never Used  Substance Use Topics  . Alcohol use: Yes    Comment: patient states she had been sober, but drank for the past 3 days  . Drug use: Not Currently    Comment: Meth, heroin 05/2017    Allergies:  Allergies  Allergen Reactions  . Lactose Intolerance (Gi) Diarrhea and Nausea And Vomiting    Depends on the type of dairy-based product consumed as to which reaction occurs    Medications Prior to Admission  Medication Sig Dispense Refill Last Dose  . acetaminophen (TYLENOL) 325 MG tablet Take 975 mg by mouth every 6 (six) hours as needed for mild pain or headache.   Not Taking  . Ascorbic  Acid (VITAMIN C) POWD Take 1 mL by mouth daily.    Past Week at Unknown time  . ibuprofen (ADVIL,MOTRIN) 800 MG tablet Take 1 tablet (800 mg total) by mouth every 8 (eight) hours as needed. (Patient not taking: Reported on 03/21/2018) 30 tablet 0 Not Taking  . loratadine (CLARITIN) 10 MG tablet Take 10 mg by mouth daily.   Not Taking  . Methylsulfonylmethane (MSM) POWD Take 10 mLs by mouth daily.   Past Week at Unknown time  . misoprostol (CYTOTEC) 200 MCG tablet Take 4 tablets (800 mcg total) by mouth once for 1 dose. May repeat in 24 hours if no bleeding occurs from first dose 8 tablet 0   . Multiple Vitamin (MULTIVITAMIN WITH MINERALS) TABS tablet Take 1 tablet by mouth daily.   Not Taking  . ondansetron (ZOFRAN ODT) 8 MG disintegrating tablet Take 1 tablet (8 mg total) by mouth every 8 (eight) hours as needed for nausea or vomiting. (Patient not taking: Reported on 03/21/2018) 20 tablet 0 Not  Taking  . Prenatal Vit-Fe Fumarate-FA (MULTIVITAMIN-PRENATAL) 27-0.8 MG TABS tablet Take 1 tablet by mouth daily at 12 noon.   Not Taking    Review of Systems Physical Exam   Blood pressure 105/67, pulse 92, temperature 98.3 F (36.8 C), temperature source Oral, resp. rate 17, last menstrual period 12/17/2017, SpO2 (!) 88 %, unknown if currently breastfeeding.  Physical Exam  MAU Course  Procedures Results for orders placed or performed during the hospital encounter of 04/10/18 (from the past 24 hour(s))  CBC     Status: Abnormal   Collection Time: 04/10/18  9:22 PM  Result Value Ref Range   WBC 5.9 4.0 - 10.5 K/uL   RBC 4.13 3.87 - 5.11 MIL/uL   Hemoglobin 13.4 12.0 - 15.0 g/dL   HCT 40.2 36.0 - 46.0 %   MCV 97.3 80.0 - 100.0 fL   MCH 32.4 26.0 - 34.0 pg   MCHC 33.3 30.0 - 36.0 g/dL   RDW 14.6 11.5 - 15.5 %   Platelets 136 (L) 150 - 400 K/uL   nRBC 0.0 0.0 - 0.2 %  hCG, quantitative, pregnancy     Status: Abnormal   Collection Time: 04/10/18  9:22 PM  Result Value Ref Range   hCG, Beta  Chain, Quant, S 19 (H) <5 mIU/mL   US Ob Transvaginal  Result Date: 04/10/2018 CLINICAL DATA:  Acute onset of vaginal bleeding. EXAM: TRANSVAGINAL OB ULTRASOUND TECHNIQUE: Transvaginal ultrasound was performed for complete evaluation of the gestation as well as the maternal uterus, adnexal regions, and pelvic cul-de-sac. COMPARISON:  Pelvic ultrasound performed 01/18/2018 FINDINGS: Intrauterine gestational sac: None seen. Yolk sac:  N/A Embryo:  N/A Subchorionic hemorrhage:  None visualized. Maternal uterus/adnexae: The endometrial echo complex is thickened, measuring 1.8 cm. Significant focally increased blood flow is noted near the uterine fundus, suspicious for retained products of conception. The ovaries are unremarkable in appearance. The right ovary measures 3.3 x 2.3 x 2.0 cm, while the left ovary measures 2.9 x 2.6 x 1.8 cm. No suspicious adnexal masses are seen; there is no evidence for ovarian torsion. No free fluid is seen within the pelvic cul-de-sac. IMPRESSION: Focally increased blood flow noted near the uterine fundus, suspicious for retained products of conception. No intrauterine pregnancy seen. These results were called by telephone at the time of interpretation on 04/10/2018 at 11:03 pm to [place], who verbally acknowledged these results. Electronically Signed   By: Garald Balding M.D.   On: 04/10/2018 23:03    MDM UA, UDS CBC, HCG O Pos US OB Transvaginal  Previous HCGs:  10/1: 96,789 10/8:  381,017 11/30: 2,514 12/9: 100 12/16: 29  Patient did not follow up after HCG on 12/16 and reports having unprotected intercourse for "several weeks." Unsure if HCG today is old pregnancy or new pregnancy. Will get u/s to rule out ectopic.  U/S suspicious for retained POC. Consulted with Dr. Rosana Hoes- will send message to get patient scheduled for D&C this week.  Assessment and Plan   1. Retained products of conception after miscarriage    -Discharge home in stable  condition -Vaginal bleeding and pain precautions discussed -Patient advised to follow-up with [facility] hospital this week for a D&C, message sent, will call patient  -Patient may return to MAU as needed or if her condition were to change or worsen  [place] 04/10/2018, 11:30 PM

## 2018-04-10 NOTE — MAU Note (Signed)
Pt states she had a miscarriage November 25th she was 14 weeks states she was measuring 12.  Took cytotec to complete the miscarriage. Today at church there was a golf ball sized clot in her underwear and felt another one.  She started bleeding enough to fill up a maxi pad x3 throughout the rest of the day.  She has also been in pain mostly on her left side but also some mild cramping throughout her lower abdomen.  Some strong pressure like pain on the left side.

## 2018-04-11 ENCOUNTER — Encounter (HOSPITAL_COMMUNITY): Payer: Self-pay

## 2018-04-11 DIAGNOSIS — O034 Incomplete spontaneous abortion without complication: Secondary | ICD-10-CM | POA: Diagnosis present

## 2018-04-11 NOTE — Discharge Instructions (Signed)
Incomplete Miscarriage A miscarriage is the loss of an unborn baby (fetus) before the 20th week of pregnancy. In an incomplete miscarriage, parts of the fetus or placenta (afterbirth) remain in the body. Most miscarriages happen in the first 3 months of pregnancy. Sometimes, it happens before a woman even knows she is pregnant. Having a miscarriage can be an emotional experience. If you have had a miscarriage, talk with your health care provider about any questions you may have about miscarrying, the grieving process, and your future pregnancy plans. What are the causes? This condition may be caused by:  Problems with the genes or chromosomes that make it impossible for the baby to develop normally. These problems are most often the result of random errors that occur early in development, and are not passed from parent to child (not inherited).  Infection of the cervix or uterus.  Conditions that affect hormone balance in the body.  Problems with the cervix, such as the cervix opening and thinning before pregnancy is at term (cervical insufficiency).  Problems with the uterus, such as a uterus with an abnormal shape, fibroids in the uterus, or problems that were present from birth (congenital abnormalities).  Certain medical conditions.  Smoking, drinking alcohol, or using drugs.  Injury (trauma). Many times, the cause of a miscarriage is not known. What are the signs or symptoms? Symptoms of this condition include:  Vaginal bleeding or spotting, with or without cramps or pain.  Pain or cramping in the abdomen or lower back.  Passing fluid, tissue, or blood clots from the vagina. How is this diagnosed? This condition may be diagnosed based on:  A physical exam.  Ultrasound.  Blood tests.  Urine tests. How is this treated? An incomplete miscarriage may be treated with:  Dilation and curettage (D&C). This is a procedure in which the cervix is stretched open and the lining of  the uterus (endometrium) is scraped to remove any remaining tissue from the pregnancy.  Medicines, such as: ? Antibiotic medicine to treat infection. ? Medicine to help any remaining tissue pass out of your uterus. ? Medicine to reduce (contract) the size of the uterus. These medicines may be given if you have a lot of bleeding. If you have Rh negative blood and your baby was Rh positive, you will need a shot of medicine called Rh immunoglobulinto protect future babies from Rh blood problems. "Rh-negative" and "Rh-positive" refer to whether or not the blood has a specific protein found on the surface of red blood cells (Rh factor). Follow these instructions at home: Medicines   Take over-the-counter and prescription medicines only as told by your health care provider.  If you were prescribed antibiotic medicine, take your antibiotic as told by your health care provider. Do not stop taking the antibiotic even if you start to feel better.  Do not take NSAIDs, such as aspirin and ibuprofen, unless approved by your doctor. These medicines can cause bleeding. Activity  Rest as directed. Ask your health care provider what activities are safe for you.  Have someone help with home and family responsibilities during this time. General instructions  Keep track of the number of sanitary pads you use each day and how soaked (saturated) they are. Write down this information.  Monitor the amount of tissue or blood clots that you pass from your vagina. Save any large amounts of tissue for your health care provider to examine.  Do not use tampons, douche, or have sex until your health care provider   information.  · Monitor the amount of tissue or blood clots that you pass from your vagina. Save any large amounts of tissue for your health care provider to examine.  · Do not use tampons, douche, or have sex until your health care provider approves.  · To help you and your partner with the process of grieving, talk with your health care provider or seek counseling to help cope with the pregnancy loss.  · When you are ready, meet with your health care provider to discuss important steps you should take for your health, as well as steps to take in  order to have a healthy pregnancy in the future.  · Keep all follow-up visits as told by your health care provider. This is important.  Where to find more information  · The American Congress of Obstetricians and Gynecologists: www.acog.org  · U.S. Department of Health and Human Services Office of Women’s Health: www.womenshealth.gov  Contact a health care provider if:  · You have a fever or chills.  · You have a foul smelling vaginal discharge.  Get help right away if:  · You have severe cramps or pain in your back or abdomen.  · You pass walnut-sized (or larger) blood clots or tissue from your vagina.  · You have heavy bleeding, soaking more than 1 regular sanitary pad in an hour.  · You become lightheaded or weak.  · You pass out.  · You have feelings of sadness that take over your thoughts, or you have thoughts of hurting yourself.  Summary  · In an incomplete miscarriage, parts of the fetus or placenta (afterbirth) remain in the body.  · There are multiple treatment options for an incomplete miscarriage, talk to your health care provider about the best option for you.  · Follow your health care provider's instructions for follow-up care.  · To help you and your partner with the process of grieving, talk with your health care provider or seek counseling to help cope with the pregnancy loss.  This information is not intended to replace advice given to you by your health care provider. Make sure you discuss any questions you have with your health care provider.  Document Released: 03/30/2005 Document Revised: 05/06/2016 Document Reviewed: 05/06/2016  Elsevier Interactive Patient Education © 2019 Elsevier Inc.

## 2018-04-15 ENCOUNTER — Telehealth: Payer: Self-pay | Admitting: Family Medicine

## 2018-04-15 ENCOUNTER — Encounter (HOSPITAL_COMMUNITY)
Admission: RE | Disposition: A | Discharge status: Home / Self Care | Payer: Self-pay | Source: Home / Self Care | Attending: Obstetrics & Gynecology

## 2018-04-15 ENCOUNTER — Ambulatory Visit (HOSPITAL_COMMUNITY): Payer: BLUE CROSS/BLUE SHIELD | Admitting: Anesthesiology

## 2018-04-15 ENCOUNTER — Encounter (HOSPITAL_COMMUNITY): Payer: Self-pay

## 2018-04-15 ENCOUNTER — Other Ambulatory Visit: Payer: Self-pay

## 2018-04-15 ENCOUNTER — Ambulatory Visit (HOSPITAL_COMMUNITY)
Admission: RE | Admit: 2018-04-15 | Discharge: 2018-04-15 | Disposition: A | Discharge status: Home / Self Care | Payer: BLUE CROSS/BLUE SHIELD | Attending: Obstetrics & Gynecology | Admitting: Obstetrics & Gynecology

## 2018-04-15 DIAGNOSIS — F329 Major depressive disorder, single episode, unspecified: Secondary | ICD-10-CM | POA: Diagnosis not present

## 2018-04-15 DIAGNOSIS — O99330 Smoking (tobacco) complicating pregnancy, unspecified trimester: Secondary | ICD-10-CM | POA: Insufficient documentation

## 2018-04-15 DIAGNOSIS — O99340 Other mental disorders complicating pregnancy, unspecified trimester: Secondary | ICD-10-CM | POA: Insufficient documentation

## 2018-04-15 DIAGNOSIS — O034 Incomplete spontaneous abortion without complication: Secondary | ICD-10-CM | POA: Diagnosis present

## 2018-04-15 DIAGNOSIS — Z79899 Other long term (current) drug therapy: Secondary | ICD-10-CM | POA: Insufficient documentation

## 2018-04-15 DIAGNOSIS — F17210 Nicotine dependence, cigarettes, uncomplicated: Secondary | ICD-10-CM | POA: Diagnosis not present

## 2018-04-15 HISTORY — PX: DILATION AND EVACUATION: SHX1459

## 2018-04-15 LAB — CBC WITH DIFFERENTIAL/PLATELET
Basophils Absolute: 0 10*3/uL (ref 0.0–0.1)
Basophils Relative: 0 %
Eosinophils Absolute: 0.1 10*3/uL (ref 0.0–0.5)
Eosinophils Relative: 1 %
HCT: 41.9 % (ref 36.0–46.0)
Hemoglobin: 14.1 g/dL (ref 12.0–15.0)
Lymphocytes Relative: 48 %
Lymphs Abs: 2.3 10*3/uL (ref 0.7–4.0)
MCH: 32.7 pg (ref 26.0–34.0)
MCHC: 33.7 g/dL (ref 30.0–36.0)
MCV: 97.2 fL (ref 80.0–100.0)
MONO ABS: 0.4 10*3/uL (ref 0.1–1.0)
Monocytes Relative: 8 %
Neutro Abs: 2.1 10*3/uL (ref 1.7–7.7)
Neutrophils Relative %: 43 %
Platelets: 151 10*3/uL (ref 150–400)
RBC: 4.31 MIL/uL (ref 3.87–5.11)
RDW: 14.8 % (ref 11.5–15.5)
WBC: 4.8 10*3/uL (ref 4.0–10.5)
nRBC: 0 % (ref 0.0–0.2)

## 2018-04-15 SURGERY — DILATION AND EVACUATION, UTERUS
Anesthesia: General

## 2018-04-15 MED ORDER — OXYCODONE HCL 5 MG PO TABS: 5.0000 mg | ORAL_TABLET | Freq: Once | ORAL | Status: DC | PRN

## 2018-04-15 MED ORDER — FENTANYL CITRATE (PF) 100 MCG/2ML IJ SOLN
INTRAMUSCULAR | Status: DC | PRN
  Administered 2018-04-15: 100 ug via INTRAVENOUS

## 2018-04-15 MED ORDER — PROPOFOL 10 MG/ML IV BOLUS
INTRAVENOUS | Status: DC | PRN
  Administered 2018-04-15: 180 mg via INTRAVENOUS

## 2018-04-15 MED ORDER — DEXAMETHASONE SODIUM PHOSPHATE 10 MG/ML IJ SOLN
INTRAMUSCULAR | Status: DC | PRN
  Administered 2018-04-15: 10 mg via INTRAVENOUS

## 2018-04-15 MED ORDER — LIDOCAINE HCL (CARDIAC) PF 100 MG/5ML IV SOSY
PREFILLED_SYRINGE | INTRAVENOUS | Status: AC
  Filled 2018-04-15: qty 5

## 2018-04-15 MED ORDER — DEXAMETHASONE SODIUM PHOSPHATE 10 MG/ML IJ SOLN
INTRAMUSCULAR | Status: AC
  Filled 2018-04-15: qty 1

## 2018-04-15 MED ORDER — BUPIVACAINE HCL (PF) 0.5 % IJ SOLN
INTRAMUSCULAR | Status: AC
  Filled 2018-04-15: qty 30

## 2018-04-15 MED ORDER — PROMETHAZINE HCL 25 MG/ML IJ SOLN: 6.2500 mg | INTRAMUSCULAR | Status: DC | PRN

## 2018-04-15 MED ORDER — MIDAZOLAM HCL 2 MG/2ML IJ SOLN
INTRAMUSCULAR | Status: AC
  Filled 2018-04-15: qty 2

## 2018-04-15 MED ORDER — SCOPOLAMINE 1 MG/3DAYS TD PT72
1.0000 | MEDICATED_PATCH | Freq: Once | TRANSDERMAL | Status: DC
  Administered 2018-04-15: 1.5 mg via TRANSDERMAL

## 2018-04-15 MED ORDER — MEPERIDINE HCL 25 MG/ML IJ SOLN: 6.2500 mg | INTRAMUSCULAR | Status: DC | PRN

## 2018-04-15 MED ORDER — MIDAZOLAM HCL 2 MG/2ML IJ SOLN
INTRAMUSCULAR | Status: DC | PRN
  Administered 2018-04-15: 1 mg via INTRAVENOUS

## 2018-04-15 MED ORDER — IBUPROFEN 800 MG PO TABS: 800.0000 mg | ORAL_TABLET | Freq: Three times a day (TID) | ORAL | 3 refills | Status: DC | PRN

## 2018-04-15 MED ORDER — OXYCODONE-ACETAMINOPHEN 5-325 MG PO TABS: 1.0000 | ORAL_TABLET | ORAL | 0 refills | Status: DC | PRN

## 2018-04-15 MED ORDER — BUPIVACAINE HCL 0.5 % IJ SOLN
INTRAMUSCULAR | Status: DC | PRN
  Administered 2018-04-15: 30 mL

## 2018-04-15 MED ORDER — ONDANSETRON HCL 4 MG/2ML IJ SOLN
INTRAMUSCULAR | Status: DC | PRN
  Administered 2018-04-15: 4 mg via INTRAVENOUS

## 2018-04-15 MED ORDER — FENTANYL CITRATE (PF) 250 MCG/5ML IJ SOLN
INTRAMUSCULAR | Status: AC
  Filled 2018-04-15: qty 5

## 2018-04-15 MED ORDER — ONDANSETRON HCL 4 MG/2ML IJ SOLN
INTRAMUSCULAR | Status: AC
  Filled 2018-04-15: qty 2

## 2018-04-15 MED ORDER — DEXAMETHASONE SODIUM PHOSPHATE 4 MG/ML IJ SOLN
INTRAMUSCULAR | Status: AC
  Filled 2018-04-15: qty 1

## 2018-04-15 MED ORDER — KETOROLAC TROMETHAMINE 30 MG/ML IJ SOLN
INTRAMUSCULAR | Status: DC | PRN
  Administered 2018-04-15: 30 mg via INTRAVENOUS

## 2018-04-15 MED ORDER — LACTATED RINGERS IV SOLN
INTRAVENOUS | Status: DC
  Administered 2018-04-15 (×2): via INTRAVENOUS

## 2018-04-15 MED ORDER — OXYCODONE HCL 5 MG/5ML PO SOLN: 5.0000 mg | Freq: Once | ORAL | Status: DC | PRN

## 2018-04-15 MED ORDER — DOCUSATE SODIUM 100 MG PO CAPS: 100.0000 mg | ORAL_CAPSULE | Freq: Two times a day (BID) | ORAL | 2 refills | Status: DC | PRN

## 2018-04-15 MED ORDER — ONDANSETRON 8 MG PO TBDP: 8.0000 mg | ORAL_TABLET | Freq: Three times a day (TID) | ORAL | 1 refills | Status: DC | PRN

## 2018-04-15 MED ORDER — LIDOCAINE HCL (CARDIAC) PF 100 MG/5ML IV SOSY
PREFILLED_SYRINGE | INTRAVENOUS | Status: DC | PRN
  Administered 2018-04-15: 80 mg via INTRAVENOUS

## 2018-04-15 MED ORDER — HYDROMORPHONE HCL 1 MG/ML IJ SOLN: 0.2500 mg | INTRAMUSCULAR | Status: DC | PRN

## 2018-04-15 MED ORDER — DOXYCYCLINE HYCLATE 100 MG IV SOLR
200.0000 mg | INTRAVENOUS | Status: AC
  Administered 2018-04-15: 200 mg via INTRAVENOUS
  Filled 2018-04-15: qty 200

## 2018-04-15 MED ORDER — KETOROLAC TROMETHAMINE 30 MG/ML IJ SOLN
INTRAMUSCULAR | Status: AC
  Filled 2018-04-15: qty 1

## 2018-04-15 MED ORDER — SCOPOLAMINE 1 MG/3DAYS TD PT72
MEDICATED_PATCH | TRANSDERMAL | Status: AC
  Administered 2018-04-15: 1.5 mg via TRANSDERMAL
  Filled 2018-04-15: qty 1

## 2018-04-15 MED ORDER — PROPOFOL 10 MG/ML IV BOLUS
INTRAVENOUS | Status: AC
  Filled 2018-04-15: qty 20

## 2018-04-15 SURGICAL SUPPLY — 19 items
CATH ROBINSON RED A/P 16FR (CATHETERS) ×3 IMPLANT
DECANTER SPIKE VIAL GLASS SM (MISCELLANEOUS) ×3 IMPLANT
GLOVE BIOGEL PI IND STRL 7.0 (GLOVE) ×1 IMPLANT
GLOVE BIOGEL PI INDICATOR 7.0 (GLOVE) ×2
GLOVE ECLIPSE 7.0 STRL STRAW (GLOVE) ×3 IMPLANT
GOWN STRL REUS W/TWL LRG LVL3 (GOWN DISPOSABLE) ×6 IMPLANT
HIBICLENS CHG 4% 4OZ BTL (MISCELLANEOUS) ×3 IMPLANT
KIT BERKELEY 1ST TRIMESTER 3/8 (MISCELLANEOUS) ×3 IMPLANT
NS IRRIG 1000ML POUR BTL (IV SOLUTION) ×3 IMPLANT
PACK VAGINAL MINOR WOMEN LF (CUSTOM PROCEDURE TRAY) ×3 IMPLANT
PAD OB MATERNITY 4.3X12.25 (PERSONAL CARE ITEMS) ×3 IMPLANT
PAD PREP 24X48 CUFFED NSTRL (MISCELLANEOUS) ×3 IMPLANT
SET BERKELEY SUCTION TUBING (SUCTIONS) ×3 IMPLANT
TOWEL OR 17X24 6PK STRL BLUE (TOWEL DISPOSABLE) ×6 IMPLANT
VACURETTE 10 RIGID CVD (CANNULA) IMPLANT
VACURETTE 6 ASPIR F TIP BERK (CANNULA) IMPLANT
VACURETTE 7MM CVD STRL WRAP (CANNULA) IMPLANT
VACURETTE 8 RIGID CVD (CANNULA) IMPLANT
VACURETTE 9 RIGID CVD (CANNULA) IMPLANT

## 2018-04-15 NOTE — Transfer of Care (Signed)
Immediate Anesthesia Transfer of Care Note  Patient: Sylvia Maxwell  Procedure(s) Performed: DILATATION AND EVACUATION (N/A )  Patient Location: PACU  Anesthesia Type:General  Level of Consciousness: sedated  Airway & Oxygen Therapy: Patient Spontanous Breathing  Post-op Assessment: Report given to RN  Post vital signs: Reviewed and stable  Last Vitals:  Vitals Value Taken Time  BP 137/108 04/15/2018 10:48 AM  Temp    Pulse 78 04/15/2018 10:49 AM  Resp    SpO2 100 % 04/15/2018 10:49 AM  Vitals shown include unvalidated device data.  Last Pain:  Vitals:   04/15/18 0836  TempSrc: Oral  PainSc: 3       Patients Stated Pain Goal: 3 (06/00/45 9977)  Complications: No apparent anesthesia complications

## 2018-04-15 NOTE — Anesthesia Postprocedure Evaluation (Signed)
Anesthesia Post Note  Patient: Sylvia Maxwell  Procedure(s) Performed: DILATATION AND EVACUATION (N/A )     Patient location during evaluation: PACU Anesthesia Type: General Level of consciousness: awake and alert Pain management: pain level controlled Vital Signs Assessment: post-procedure vital signs reviewed and stable Respiratory status: spontaneous breathing, nonlabored ventilation and respiratory function stable Cardiovascular status: blood pressure returned to baseline and stable Postop Assessment: no apparent nausea or vomiting Anesthetic complications: no    Last Vitals:  Vitals:   04/15/18 1140 04/15/18 1145  BP: 121/87 (!) 127/94  Pulse:  84  Resp:  16  Temp:  37 C  SpO2: 96% 96%    Last Pain:  Vitals:   04/15/18 1140  TempSrc:   PainSc: 1    Pain Goal: Patients Stated Pain Goal: 3 (04/15/18 0836)               Lynda Rainwater

## 2018-04-15 NOTE — H&P (Signed)
Preoperative History and Physical  Sylvia Maxwell is a 33 y.o. G1P0 here for surgical management of retained products of conception after miscarriage.   No significant preoperative concerns.  Proposed surgery: Dilation and Evacuation.   Patient Active Problem List   Diagnosis Date Noted  . Retained products of conception after miscarriage 04/11/2018  . Polysubstance (including opioids) dependence with physiol dependence ([place]) 05/22/2017  . Major depressive disorder, single episode, severe ([place]) 05/22/2017  . Heroin overdose ([place]) 05/20/2017  . Alcohol dependence with withdrawal with complication ([place]) 41/74/0814  . Alcohol withdrawal seizure ([place]) 06/13/2016  . Seizure ([place])   . ETOH abuse 06/12/2016  . Alcohol withdrawal seizure without complication ([place]) 48/18/5631    Past Medical History:  Diagnosis Date  . Alcoholism ([place])   . Bronchitis   . Depression   . Seizures ([place])    Past Surgical History:  Procedure Laterality Date  . BREAST ENHANCEMENT SURGERY  2008   OB History  Gravida Para Term Preterm AB Living  1            SAB TAB Ectopic Multiple Live Births               # Outcome Date GA Lbr Len/2nd Weight Sex Delivery Anes PTL Lv  1 Gravida           Patient denies any other pertinent gynecologic issues.   No current facility-administered medications on file prior to encounter.    Current Outpatient Medications on File Prior to Encounter  Medication Sig Dispense Refill  . acetaminophen (TYLENOL) 325 MG tablet Take 975 mg by mouth every 6 (six) hours as needed for mild pain or headache.    Marland Kitchen FLUoxetine (PROZAC) 10 MG capsule Take 10 mg by mouth daily.    Marland Kitchen ibuprofen (ADVIL,MOTRIN) 800 MG tablet Take 1 tablet (800 mg total) by mouth every 8 (eight) hours as needed. 30 tablet 0  . Multiple Vitamin (MULTIVITAMIN WITH MINERALS) TABS tablet Take 1 tablet by mouth daily.    . ondansetron (ZOFRAN ODT) 8 MG disintegrating tablet Take 1 tablet (8 mg total) by mouth every 8  (eight) hours as needed for nausea or vomiting. 20 tablet 0  . Ascorbic Acid (VITAMIN C) POWD Take 1 mL by mouth daily.     Marland Kitchen loratadine (CLARITIN) 10 MG tablet Take 10 mg by mouth daily.    . Methylsulfonylmethane (MSM) POWD Take 10 mLs by mouth daily.    . Prenatal Vit-Fe Fumarate-FA (MULTIVITAMIN-PRENATAL) 27-0.8 MG TABS tablet Take 1 tablet by mouth daily at 12 noon.     Allergies  Allergen Reactions  . Lactose Intolerance (Gi) Diarrhea and Nausea And Vomiting    Depends on the type of dairy-based product consumed as to which reaction occurs    Social History:   reports that she has been smoking cigarettes. She has never used smokeless tobacco. She reports current alcohol use. She reports previous drug use.  Family History  Family history unknown: Yes    Review of Systems: Pertinent items noted in HPI and remainder of comprehensive ROS otherwise negative.  PHYSICAL EXAM: Blood pressure 113/90, pulse 93, temperature 98 F (36.7 C), temperature source Oral, resp. rate 18, height 5\' 7"  (1.702 m), weight 66 kg, last menstrual period 03/07/2018, SpO2 97 %, unknown if currently breastfeeding. CONSTITUTIONAL: Well-developed, well-nourished female in no acute distress.  HENT:  Normocephalic, atraumatic, External right and left ear normal. Oropharynx is clear and moist EYES: Conjunctivae and EOM are normal. Pupils are equal,  round, and reactive to light. No scleral icterus.  NECK: Normal range of motion, supple, no masses SKIN: Skin is warm and dry. No rash noted. Not diaphoretic. No erythema. No pallor. NEUROLOGIC: Alert and oriented to person, place, and time. Normal reflexes, muscle tone coordination. No cranial nerve deficit noted. PSYCHIATRIC: Normal mood and affect. Normal behavior. Normal judgment and thought content. CARDIOVASCULAR: Normal heart rate noted, regular rhythm RESPIRATORY: Effort and breath sounds normal, no problems with respiration noted ABDOMEN: Soft, nontender,  nondistended. PELVIC: Deferred MUSCULOSKELETAL: Normal range of motion. No edema and no tenderness. 2+ distal pulses.  Labs: Results for orders placed or performed during the hospital encounter of 04/15/18 (from the past 336 hour(s))  CBC WITH DIFFERENTIAL   Collection Time: 04/15/18  8:20 AM  Result Value Ref Range   WBC 4.8 4.0 - 10.5 K/uL   RBC 4.31 3.87 - 5.11 MIL/uL   Hemoglobin 14.1 12.0 - 15.0 g/dL   HCT 41.9 36.0 - 46.0 %   MCV 97.2 80.0 - 100.0 fL   MCH 32.7 26.0 - 34.0 pg   MCHC 33.7 30.0 - 36.0 g/dL   RDW 14.8 11.5 - 15.5 %   Platelets 151 150 - 400 K/uL   nRBC 0.0 0.0 - 0.2 %   Neutrophils Relative % 43 %   Neutro Abs 2.1 1.7 - 7.7 K/uL   Lymphocytes Relative 48 %   Lymphs Abs 2.3 0.7 - 4.0 K/uL   Monocytes Relative 8 %   Monocytes Absolute 0.4 0.1 - 1.0 K/uL   Eosinophils Relative 1 %   Eosinophils Absolute 0.1 0.0 - 0.5 K/uL   Basophils Relative 0 %   Basophils Absolute 0.0 0.0 - 0.1 K/uL   Other PENDING %  Results for orders placed or performed during the hospital encounter of 04/10/18 (from the past 336 hour(s))  CBC   Collection Time: 04/10/18  9:22 PM  Result Value Ref Range   WBC 5.9 4.0 - 10.5 K/uL   RBC 4.13 3.87 - 5.11 MIL/uL   Hemoglobin 13.4 12.0 - 15.0 g/dL   HCT 40.2 36.0 - 46.0 %   MCV 97.3 80.0 - 100.0 fL   MCH 32.4 26.0 - 34.0 pg   MCHC 33.3 30.0 - 36.0 g/dL   RDW 14.6 11.5 - 15.5 %   Platelets 136 (L) 150 - 400 K/uL   nRBC 0.0 0.0 - 0.2 %  hCG, quantitative, pregnancy   Collection Time: 04/10/18  9:22 PM  Result Value Ref Range   hCG, Beta Chain, Quant, S 19 (H) <5 mIU/mL    Imaging Studies: US Ob Transvaginal  Result Date: 04/10/2018 CLINICAL DATA:  Acute onset of vaginal bleeding. EXAM: TRANSVAGINAL OB ULTRASOUND TECHNIQUE: Transvaginal ultrasound was performed for complete evaluation of the gestation as well as the maternal uterus, adnexal regions, and pelvic cul-de-sac. COMPARISON:  Pelvic ultrasound performed 01/18/2018  FINDINGS: Intrauterine gestational sac: None seen. Yolk sac:  N/A Embryo:  N/A Subchorionic hemorrhage:  None visualized. Maternal uterus/adnexae: The endometrial echo complex is thickened, measuring 1.8 cm. Significant focally increased blood flow is noted near the uterine fundus, suspicious for retained products of conception. The ovaries are unremarkable in appearance. The right ovary measures 3.3 x 2.3 x 2.0 cm, while the left ovary measures 2.9 x 2.6 x 1.8 cm. No suspicious adnexal masses are seen; there is no evidence for ovarian torsion. No free fluid is seen within the pelvic cul-de-sac. IMPRESSION: Focally increased blood flow noted near the uterine fundus, suspicious for  retained products of conception. No intrauterine pregnancy seen. These results were called by telephone at the time of interpretation on 04/10/2018 at 11:03 pm to [place], who verbally acknowledged these results. Electronically Signed   By: Garald Balding M.D.   On: 04/10/2018 23:03    Assessment: Principal Problem:   Retained products of conception after miscarriage   Plan: Patient will undergo surgical management with Dilation and Evacuation.  Risks of surgery including bleeding, infection, injury to surrounding organs, need for additional procedures, possibility of intrauterine scarring which may impair future fertility, risk of retained products which may require further management and other postoperative/anesthesia complications were explained to patient.  Likelihood of success of complete evacuation of the uterus was discussed with the patient.  Written informed consent was obtained.  Patient has been NPO since last night  she will remain NPO for procedure. Anesthesia and OR aware.  Preoperative prophylactic Doxycycline 200mg  IV  has been ordered and is on call to the OR.  To OR when ready.    Verita Schneiders, MD, [place] for [employer], [place]

## 2018-04-15 NOTE — Anesthesia Preprocedure Evaluation (Signed)
Anesthesia Evaluation  Patient identified by MRN, date of birth, ID band Patient awake    Reviewed: Allergy & Precautions, NPO status , Patient's Chart, lab work & pertinent test results  Airway Mallampati: II  TM Distance: >3 FB Neck ROM: Full    Dental no notable dental hx.    Pulmonary neg pulmonary ROS, Current Smoker,    Pulmonary exam normal breath sounds clear to auscultation       Cardiovascular negative cardio ROS Normal cardiovascular exam Rhythm:Regular Rate:Normal     Neuro/Psych Depression negative neurological ROS  negative psych ROS   GI/Hepatic negative GI ROS, (+)     substance abuse  alcohol use,   Endo/Other  negative endocrine ROS  Renal/GU negative Renal ROS  negative genitourinary   Musculoskeletal negative musculoskeletal ROS (+)   Abdominal   Peds negative pediatric ROS (+)  Hematology negative hematology ROS (+)   Anesthesia Other Findings   Reproductive/Obstetrics negative OB ROS                             Anesthesia Physical Anesthesia Plan  ASA: II  Anesthesia Plan: General   Post-op Pain Management:    Induction: Intravenous  PONV Risk Score and Plan: 2 and Ondansetron and Midazolam  Airway Management Planned: LMA  Additional Equipment:   Intra-op Plan:   Post-operative Plan: Extubation in OR  Informed Consent: I have reviewed the patients History and Physical, chart, labs and discussed the procedure including the risks, benefits and alternatives for the proposed anesthesia with the patient or authorized representative who has indicated his/her understanding and acceptance.   Dental advisory given  Plan Discussed with: CRNA  Anesthesia Plan Comments:         Anesthesia Quick Evaluation

## 2018-04-15 NOTE — Discharge Instructions (Signed)
Dilation and Curettage or Vacuum Curettage, Care After These instructions give you information about caring for yourself after your procedure. Your doctor may also give you more specific instructions. Call your doctor if you have any problems or questions after your procedure. Follow these instructions at home: Activity  Do not drive or use heavy machinery while taking prescription pain medicine.  For 24 hours after your procedure, avoid driving.  Take short walks often, followed by rest periods. Ask your doctor what activities are safe for you. After one or two days, you may be able to return to your normal activities.  Do not lift anything that is heavier than 10 lb (4.5 kg) until your doctor approves.  For at least 2 weeks, or as long as told by your doctor: ? Do not douche. ? Do not use tampons. ? Do not have sex. General instructions   Take over-the-counter and prescription medicines only as told by your doctor. This is very important if you take blood thinning medicine.  Do not take baths, swim, or use a hot tub until your doctor approves. Take showers instead of baths.  Wear compression stockings as told by your doctor.  It is up to you to get the results of your procedure. Ask your doctor when your results will be ready.  Keep all follow-up visits as told by your doctor. This is important. Contact a doctor if:  You have very bad cramps that get worse or do not get better with medicine.  You have very bad pain in your belly (abdomen).  You cannot drink fluids without throwing up (vomiting).  You get pain in a different part of the area between your belly and thighs (pelvis).  You have bad-smelling discharge from your vagina.  You have a rash. Get help right away if:  You are bleeding a lot from your vagina. A lot of bleeding means soaking more than one sanitary pad in an hour, for 2 hours in a row.  You have clumps of blood (blood clots) coming from your  vagina.  You have a fever or chills.  Your belly feels very tender or hard.  You have chest pain.  You have trouble breathing.  You cough up blood.  You feel dizzy.  You feel light-headed.  You pass out (faint).  You have pain in your neck or shoulder area. Summary  Take short walks often, followed by rest periods. Ask your doctor what activities are safe for you. After one or two days, you may be able to return to your normal activities.  Do not lift anything that is heavier than 10 lb (4.5 kg) until your doctor approves.  Do not take baths, swim, or use a hot tub until your doctor approves. Take showers instead of baths.  Contact your doctor if you have any symptoms of infection, like bad-smelling discharge from your vagina. This information is not intended to replace advice given to you by your health care provider. Make sure you discuss any questions you have with your health care provider. Document Released: 01/07/2008 Document Revised: 12/16/2015 Document Reviewed: 12/16/2015 Elsevier Interactive Patient Education  2019 [facility] Instructions  Activity: Get plenty of rest for the remainder of the day. A responsible individual must stay with you for 24 hours following the procedure.  For the next 24 hours, DO NOT: -Drive a car -Paediatric nurse -Drink alcoholic beverages -Take any medication unless instructed by your physician -Make any  legal decisions or sign important papers.  Meals: Start with liquid foods such as gelatin or soup. Progress to regular foods as tolerated. Avoid greasy, spicy, heavy foods. If nausea and/or vomiting occur, drink only clear liquids until the nausea and/or vomiting subsides. Call your physician if vomiting continues.  Special Instructions/Symptoms: Your throat may feel dry or sore from the anesthesia or the breathing tube placed in your throat during surgery. If this causes discomfort, gargle  with warm salt water. The discomfort should disappear within 24 hours.  If you had a scopolamine patch placed behind your ear for the management of post- operative nausea and/or vomiting:  1. The medication in the patch is effective for 72 hours, after which it should be removed.  Wrap patch in a tissue and discard in the trash. Wash hands thoroughly with soap and water. 2. You may remove the patch earlier than 72 hours if you experience unpleasant side effects which may include dry mouth, dizziness or visual disturbances. 3. Avoid touching the patch. Wash your hands with soap and water after contact with the patch.

## 2018-04-15 NOTE — Op Note (Signed)
Delanie Peltz PROCEDURE DATE: 04/15/2018  PREOPERATIVE DIAGNOSIS:Retained products of conception after miscarriage POSTOPERATIVE DIAGNOSIS: The same PROCEDURE:     Dilation and Evacuation SURGEON:  Dr. Verita Schneiders  INDICATIONS: 33 y.o. G1P0 with retained products of conception after miscarriage, needing surgical completion.  Risks of surgery were discussed with the patient including but not limited to: bleeding which may require transfusion; infection which may require antibiotics; injury to uterus or surrounding organs; need for additional procedures including laparotomy or laparoscopy; possibility of intrauterine scarring which may impair future fertility; and other postoperative/anesthesia complications. Written informed consent was obtained.    FINDINGS:  Small uterus, moderate amounts of products of conception, specimen sent to pathology.  ANESTHESIA: General-LMA, paracervical block. ESTIMATED BLOOD LOSS: 20 ml. SPECIMENS:  Products of conception sent to pathology COMPLICATIONS:  None immediate.  PROCEDURE DETAILS:  The patient received intravenous Doxycycline while in the preoperative area.  She was then taken to the operating room where general anesthesia was administered and was found to be adequate.  After an adequate timeout was performed, she was placed in the dorsal lithotomy position and examined; then prepped and draped in the sterile manner.   Her bladder was catheterized for an unmeasured amount of clear, yellow urine. A vaginal speculum was then placed in the patient's vagina and a single tooth tenaculum was applied to the anterior lip of the cervix.  A paracervical block using 30 ml of 0.5% Marcaine was administered. The cervix was gently dilated to accommodate a 7 mm suction curette that was gently advanced to the uterine fundus.  The suction device was then activated and curette slowly rotated to clear the uterus of products of conception.  A sharp curettage was then performed to  confirm complete emptying of the uterus. There was minimal bleeding noted and the tenaculum removed with good hemostasis noted.   All instruments were removed from the patient's vagina.  Sponge and instrument counts were correct times two  The patient tolerated the procedure well and was taken to the recovery area extubated, awake, and in stable condition.  The patient will be discharged to home as per PACU criteria.  Routine postoperative instructions given.  She was prescribed Percocet, Ibuprofen and Colace.  She will follow up in the office in 2-3 weeks for postoperative evaluation.   Verita Schneiders, MD, [place] for [employer], [place]

## 2018-04-15 NOTE — Anesthesia Procedure Notes (Signed)
Procedure Name: LMA Insertion Date/Time: 04/15/2018 10:00 AM Performed by: Asher Muir, CRNA Pre-anesthesia Checklist: Patient identified, Patient being monitored, Emergency Drugs available, Timeout performed and Suction available Patient Re-evaluated:Patient Re-evaluated prior to induction Oxygen Delivery Method: Circle System Utilized Preoxygenation: Pre-oxygenation with 100% oxygen Induction Type: IV induction Ventilation: Mask ventilation without difficulty LMA: LMA inserted LMA Size: 4.0 Number of attempts: 1 Placement Confirmation: positive ETCO2 and breath sounds checked- equal and bilateral

## 2018-04-15 NOTE — Telephone Encounter (Signed)
Called patient and informed of appt. Patient agreed to date and time.

## 2018-04-16 ENCOUNTER — Encounter (HOSPITAL_COMMUNITY): Payer: Self-pay | Admitting: Obstetrics & Gynecology

## 2018-04-19 NOTE — Telephone Encounter (Signed)
Previously noted.

## 2018-04-25 DIAGNOSIS — F17200 Nicotine dependence, unspecified, uncomplicated: Secondary | ICD-10-CM | POA: Insufficient documentation

## 2018-05-04 ENCOUNTER — Ambulatory Visit: Payer: BLUE CROSS/BLUE SHIELD | Admitting: Obstetrics & Gynecology

## 2018-08-24 DIAGNOSIS — F1094 Alcohol use, unspecified with alcohol-induced mood disorder: Secondary | ICD-10-CM | POA: Insufficient documentation

## 2018-08-26 ENCOUNTER — Ambulatory Visit (HOSPITAL_COMMUNITY)
Admission: EM | Admit: 2018-08-26 | Discharge: 2018-08-26 | Disposition: A | Discharge status: Home / Self Care | Payer: BLUE CROSS/BLUE SHIELD | Attending: Family Medicine | Admitting: Family Medicine

## 2018-08-26 ENCOUNTER — Encounter (HOSPITAL_COMMUNITY): Payer: Self-pay | Admitting: Emergency Medicine

## 2018-08-26 ENCOUNTER — Ambulatory Visit (INDEPENDENT_AMBULATORY_CARE_PROVIDER_SITE_OTHER): Payer: BLUE CROSS/BLUE SHIELD

## 2018-08-26 DIAGNOSIS — S92015A Nondisplaced fracture of body of left calcaneus, initial encounter for closed fracture: Secondary | ICD-10-CM

## 2018-08-26 MED ORDER — NUTRINATE PO CHEW
1.00 | CHEWABLE_TABLET | ORAL | Status: DC
Start: ? — End: 2018-08-26

## 2018-08-26 MED ORDER — QUINERVA 260 MG PO TABS
650.00 | ORAL_TABLET | ORAL | Status: DC
Start: ? — End: 2018-08-26

## 2018-08-26 MED ORDER — NEOMYCIN-POLYMYXIN-GRAMICIDIN 1.75-10000-.025 OP SOLN
8.00 | OPHTHALMIC | Status: DC
Start: ? — End: 2018-08-26

## 2018-08-26 MED ORDER — IBUPROFEN 800 MG PO TABS: 800.0000 mg | ORAL_TABLET | Freq: Three times a day (TID) | ORAL | 0 refills | Status: DC

## 2018-08-26 MED ORDER — DIOVAN HCT 160-25 MG PO TABS
5.00 | ORAL_TABLET | ORAL | Status: DC
Start: ? — End: 2018-08-26

## 2018-08-26 MED ORDER — SB BRONCHIAL 12.5-200 MG PO TABS
50.00 | ORAL_TABLET | ORAL | Status: DC
Start: ? — End: 2018-08-26

## 2018-08-26 MED ORDER — NICOTINE POLACRILEX 2 MG MT GUM
2.00 | CHEWING_GUM | OROMUCOSAL | Status: DC
Start: ? — End: 2018-08-26

## 2018-08-26 MED ORDER — DIPHENHYDRAMINE HCL 25 MG PO CAPS
50.00 | ORAL_CAPSULE | ORAL | Status: DC
Start: ? — End: 2018-08-26

## 2018-08-26 MED ORDER — GABAPENTIN 400 MG PO CAPS
400.00 | ORAL_CAPSULE | ORAL | Status: DC
Start: 2018-08-26 — End: 2018-08-26

## 2018-08-26 MED ORDER — HYDROCODONE-ACETAMINOPHEN 5-325 MG PO TABS: 1.0000 | ORAL_TABLET | Freq: Four times a day (QID) | ORAL | 0 refills | Status: DC | PRN

## 2018-08-26 MED ORDER — Medication
Status: DC
Start: ? — End: 2018-08-26

## 2018-08-26 MED ORDER — BELLADONNA ALKALOIDS-OPIUM RE
10.00 | RECTAL | Status: DC
Start: ? — End: 2018-08-26

## 2018-08-26 MED ORDER — FLUOXETINE HCL 20 MG PO CAPS
20.00 | ORAL_CAPSULE | ORAL | Status: DC
Start: 2018-08-27 — End: 2018-08-26

## 2018-08-26 MED ORDER — CLONIDINE HCL 0.1 MG PO TABS
.10 | ORAL_TABLET | ORAL | Status: DC
Start: ? — End: 2018-08-26

## 2018-08-26 MED ORDER — PEDIASURE 1.0 CAL/FIBER PO LIQD
2.00 | ORAL | Status: DC
Start: ? — End: 2018-08-26

## 2018-08-26 MED ORDER — GUAIFENESIN-DM 100-10 MG/5ML PO SYRP
10.00 | ORAL_SOLUTION | ORAL | Status: DC
Start: ? — End: 2018-08-26

## 2018-08-26 MED ORDER — GENERIC EXTERNAL MEDICATION
1.00 | Status: DC
Start: 2018-08-27 — End: 2018-08-26

## 2018-08-26 MED ORDER — BL TUSSIN CF 30-10-100 MG/5ML PO SYRP
2.00 | ORAL_SOLUTION | ORAL | Status: DC
Start: ? — End: 2018-08-26

## 2018-08-26 MED ORDER — ONDANSETRON 4 MG PO TBDP
ORAL_TABLET | ORAL | Status: AC
  Filled 2018-08-26: qty 1

## 2018-08-26 MED ORDER — NUTREN 1.0 PO
1.00 | ORAL | Status: DC
Start: ? — End: 2018-08-26

## 2018-08-26 MED ORDER — HYDROCODONE-ACETAMINOPHEN 5-325 MG PO TABS
1.0000 | ORAL_TABLET | Freq: Once | ORAL | Status: AC
  Administered 2018-08-26: 1 via ORAL

## 2018-08-26 MED ORDER — BENZOCAINE-MENTHOL 6-10 MG MT LOZG
1.00 | LOZENGE | OROMUCOSAL | Status: DC
Start: ? — End: 2018-08-26

## 2018-08-26 MED ORDER — HYDROXYZINE HCL 25 MG PO TABS
50.00 | ORAL_TABLET | ORAL | Status: DC
Start: ? — End: 2018-08-26

## 2018-08-26 MED ORDER — HYDROCODONE-ACETAMINOPHEN 5-325 MG PO TABS
ORAL_TABLET | ORAL | Status: AC
  Filled 2018-08-26: qty 1

## 2018-08-26 MED ORDER — Medication
5.00 | Status: DC
Start: ? — End: 2018-08-26

## 2018-08-26 MED ORDER — ONDANSETRON 4 MG PO TBDP
4.0000 mg | ORAL_TABLET | Freq: Once | ORAL | Status: AC
  Administered 2018-08-26: 4 mg via ORAL

## 2018-08-26 MED ORDER — ALUMINUM-MAGNESIUM-SIMETHICONE 200-200-20 MG/5ML PO SUSP
30.00 | ORAL | Status: DC
Start: ? — End: 2018-08-26

## 2018-08-26 MED ORDER — Medication
2.00 | Status: DC
Start: ? — End: 2018-08-26

## 2018-08-26 MED ORDER — BISACODYL 5 MG PO TBEC
10.00 | DELAYED_RELEASE_TABLET | ORAL | Status: DC
Start: ? — End: 2018-08-26

## 2018-08-26 NOTE — Progress Notes (Signed)
Orthopedic Tech Progress Note Patient Details:  Jarita [facility] [birth year] [id number]  Ortho Devices Type of Ortho Device: Crutches, Short leg splint, Stirrup splint Ortho Device/Splint Location: LLE Ortho Device/Splint Interventions: Adjustment, Application, Ordered   Post Interventions Patient Tolerated: Well Instructions Provided: Poper ambulation with device, Care of device, Adjustment of device   Janit Pagan 08/26/2018, 3:32 PM

## 2018-08-26 NOTE — ED Provider Notes (Signed)
[facility]    CSN: [id number] Arrival date & time: 08/26/18  1339     History   Chief Complaint Chief Complaint  Patient presents with  . Fall  . Ankle Injury    HPI Sylvia Maxwell is a 33 y.o. female.   Sylvia Maxwell presents with complaints of left ankle and foot pain after she fell from a ladder at approximately 11:30 today. She was cleaning her windows on an 8 foot ladder, was approximately 2 steps down from top, therefore approximately 6 ft up or so. The ladder started to tip therefore she jumped back off of it to prevent from falling with it. She landed on her cement driveway on her flat feet, her ankle then twisted as she fell back. She did hit her head. No LOC. No headache or neck pain. No nausea or vomiting. Had immediate left ankle pain. No numbness or tingling. Swelling present. Has not been able to bear weight since. Denies any previous foot/ankle injury to left foot. Hasn't taken any medications for symptoms. Hx fo alcoholism, depression, seizures, heroin overdose.     ROS per HPI, negative if not otherwise mentioned.      Past Medical History:  Diagnosis Date  . Alcoholism ([place])   . Bronchitis   . Depression   . Seizures [facility])     Patient Active Problem List   Diagnosis Date Noted  . Retained products of conception after miscarriage 04/11/2018  . Polysubstance (including opioids) dependence with physiol dependence ([place]) 05/22/2017  . Major depressive disorder, single episode, severe ([place]) 05/22/2017  . Heroin overdose ([place]) 05/20/2017  . Alcohol dependence with withdrawal with complication ([place]) 40/98/1191  . Alcohol withdrawal seizure ([place]) 06/13/2016  . Seizure ([place])   . ETOH abuse 06/12/2016  . Alcohol withdrawal seizure without complication ([place]) 47/82/9562    Past Surgical History:  Procedure Laterality Date  . BREAST ENHANCEMENT SURGERY  2008  . DILATION AND EVACUATION N/A 04/15/2018   Procedure: DILATATION AND EVACUATION;  Surgeon:  Osborne Oman, MD;  Location: [facility];  Service: Gynecology;  Laterality: N/A;    OB History    Gravida  1   Para      Term      Preterm      AB      Living        SAB      TAB      Ectopic      Multiple      Live Births               Home Medications    Prior to Admission medications   Medication Sig Start Date End Date Taking? Authorizing Provider  FLUoxetine (PROZAC) 10 MG capsule Take 10 mg by mouth daily.    [provider]  HYDROcodone-acetaminophen (NORCO/VICODIN) 5-325 MG tablet Take 1 tablet by mouth every 6 (six) hours as needed. 08/26/18   Zigmund Gottron, NP  ibuprofen (ADVIL) 800 MG tablet Take 1 tablet (800 mg total) by mouth 3 (three) times daily. 08/26/18   Zigmund Gottron, NP  loratadine (CLARITIN) 10 MG tablet Take 10 mg by mouth daily.    [provider]  Prenatal Vit-Fe Fumarate-FA (MULTIVITAMIN-PRENATAL) 27-0.8 MG TABS tablet Take 1 tablet by mouth daily at 12 noon.    [provider]    Family History Family History  Family history unknown: Yes    Social History Social History   Tobacco Use  . Smoking status:  Current Every Day Smoker    Types: Cigarettes  . Smokeless tobacco: Never Used  Substance Use Topics  . Alcohol use: Yes    Comment: patient states she had been sober, but drank for the past 3 days  . Drug use: Not Currently    Comment: Meth, heroin 05/2017     Allergies   Lactose intolerance (gi)   Review of Systems Review of Systems   Physical Exam Triage Vital Signs ED Triage Vitals  Enc Vitals Group     BP 08/26/18 1404 95/62     Pulse Rate 08/26/18 1404 92     Resp 08/26/18 1404 18     Temp 08/26/18 1404 98.8 F (37.1 C)     Temp src --      SpO2 08/26/18 1404 100 %     Weight --      Height --      Head Circumference --      Peak Flow --      Pain Score 08/26/18 1405 10     Pain Loc --      Pain Edu? --      Excl. in [place]? --    No data found.  Updated Vital Signs  BP 95/62   Pulse 92   Temp 98.8 F (37.1 C)   Resp 18   LMP 08/16/2018   SpO2 100%   Visual Acuity Right Eye Distance:   Left Eye Distance:   Bilateral Distance:    Right Eye Near:   Left Eye Near:    Bilateral Near:     Physical Exam Constitutional:      General: She is not in acute distress.    Appearance: She is well-developed.  Cardiovascular:     Rate and Rhythm: Normal rate and regular rhythm.     Heart sounds: Normal heart sounds.  Pulmonary:     Effort: Pulmonary effort is normal.     Breath sounds: Normal breath sounds.  Musculoskeletal:     Left ankle: She exhibits decreased range of motion and swelling. She exhibits no ecchymosis, no laceration and normal pulse. Tenderness. Lateral malleolus, medial malleolus and AITFL tenderness found. Achilles tendon normal.     Left foot: Normal range of motion and normal capillary refill. Tenderness and bony tenderness present. No swelling, crepitus or laceration.     Comments: Left lateral foot grossly swollen with tenderness; minimal ankle ROM due to pain; generalized heel pain on palpation; +2 dorsalis pedis pulse; cap refill < 2 seconds   Skin:    General: Skin is warm and dry.  Neurological:     Mental Status: She is alert and oriented to person, place, and time.      UC Treatments / Results  Labs (all labs ordered are listed, but only abnormal results are displayed) Labs Reviewed - No data to display  EKG None  Radiology Dg Ankle Complete Left  Result Date: 08/26/2018 CLINICAL DATA:  Fall from ladder EXAM: LEFT ANKLE COMPLETE - 3+ VIEW COMPARISON:  None. FINDINGS: Fracture in the mid and posterior calcaneus without significant displacement. Normal ankle mortise. No fracture of the ankle. Extensive lateral soft tissue swelling with joint effusion. IMPRESSION: Fracture of the calcaneus without significant displacement. Soft tissue swelling with joint effusion Electronically Signed   By: Franchot Gallo M.D.   On:  08/26/2018 14:43   Dg Foot Complete Left  Result Date: 08/26/2018 CLINICAL DATA:  Fall from ladder EXAM: LEFT FOOT - COMPLETE 3+ VIEW COMPARISON:  None. FINDINGS: Fracture of the mid and posterior calcaneus without significant displacement. No other fracture in the foot. IMPRESSION: Fracture of the calcaneus. Electronically Signed   By: Franchot Gallo M.D.   On: 08/26/2018 14:41    Procedures Procedures (including critical care time)  Medications Ordered in UC Medications  HYDROcodone-acetaminophen (NORCO/VICODIN) 5-325 MG per tablet 1 tablet (1 tablet Oral Given 08/26/18 1412)  ondansetron (ZOFRAN-ODT) disintegrating tablet 4 mg (4 mg Oral Given 08/26/18 1412)    Initial Impression / Assessment and Plan / UC Course  I have reviewed the triage vital signs and the nursing notes.  Pertinent labs & imaging results that were available during my care of the patient were reviewed by me and considered in my medical decision making (see chart for details).     Left calcaneal fracture s/p fall from ladder. Neurovascularly intact. Dr. Doreatha Martin on call notified of patient, he was able to review imaging. Recommends splint, NWB, follow up on Monday with his office as she needs CT imaging and will need surgery. Patient notified. Ortho tech placed splint. Pain management provided and discussed with patient. Patient verbalized understanding and agreeable to plan.   Final Clinical Impressions(s) / UC Diagnoses   Final diagnoses:  Closed nondisplaced fracture of body of left calcaneus, initial encounter     Discharge Instructions     CLINICAL DATA:  Fall from ladder   EXAM: LEFT FOOT - COMPLETE 3+ VIEW   COMPARISON:  None.   FINDINGS: Fracture of the mid and posterior calcaneus without significant displacement. No other fracture in the foot.   IMPRESSION: Fracture of the calcaneus.     Electronically Signed   By: Franchot Gallo M.D.   On: 08/26/2018 14:41  Ice, elevation, hydrocodone  for breakthrough pain, ibuprofen every 8 hours for pain. Hydrocodone may cause drowsiness. Please do not take if driving or drinking alcohol.   Call early Monday morning to Dr. Tama Headings office: [phone] to get in schedule.  Non weight bearing. Splint and crutches as supplied.    ED Prescriptions    Medication Sig Dispense Auth. Provider   HYDROcodone-acetaminophen (NORCO/VICODIN) 5-325 MG tablet Take 1 tablet by mouth every 6 (six) hours as needed. 10 tablet Augusto Gamble B, NP   ibuprofen (ADVIL) 800 MG tablet Take 1 tablet (800 mg total) by mouth 3 (three) times daily. 21 tablet Zigmund Gottron, NP     Controlled Substance Prescriptions [place] Controlled Substance Registry consulted? Not Applicable   Zigmund Gottron, NP 08/26/18 1515

## 2018-08-26 NOTE — ED Triage Notes (Signed)
Pt staets today she fell off a ladder and twisted her ankle. Denies hitting head denies LOC.

## 2018-08-26 NOTE — ED Notes (Signed)
Ortho paged. 

## 2018-08-26 NOTE — Discharge Instructions (Signed)
CLINICAL DATA:  Fall from ladder   EXAM: LEFT FOOT - COMPLETE 3+ VIEW   COMPARISON:  None.   FINDINGS: Fracture of the mid and posterior calcaneus without significant displacement. No other fracture in the foot.   IMPRESSION: Fracture of the calcaneus.     Electronically Signed   By: Franchot Gallo M.D.   On: 08/26/2018 14:41  Ice, elevation, hydrocodone for breakthrough pain, ibuprofen every 8 hours for pain. Hydrocodone may cause drowsiness. Please do not take if driving or drinking alcohol.   Call early Monday morning to Dr. Tama Headings office: [phone] to get in schedule.  Non weight bearing. Splint and crutches as supplied.

## 2018-09-07 ENCOUNTER — Other Ambulatory Visit: Payer: Self-pay | Admitting: Orthopaedic Surgery

## 2018-09-07 DIAGNOSIS — S92002A Unspecified fracture of left calcaneus, initial encounter for closed fracture: Secondary | ICD-10-CM

## 2018-09-20 ENCOUNTER — Ambulatory Visit
Admission: RE | Admit: 2018-09-20 | Discharge: 2018-09-20 | Disposition: A | Discharge status: Home / Self Care | Payer: BC Managed Care – PPO | Source: Ambulatory Visit | Attending: Orthopaedic Surgery | Admitting: Orthopaedic Surgery

## 2018-09-20 ENCOUNTER — Other Ambulatory Visit: Payer: Self-pay

## 2018-09-20 DIAGNOSIS — S92002A Unspecified fracture of left calcaneus, initial encounter for closed fracture: Secondary | ICD-10-CM

## 2018-09-23 ENCOUNTER — Other Ambulatory Visit: Payer: Self-pay | Admitting: Orthopaedic Surgery

## 2018-09-24 ENCOUNTER — Other Ambulatory Visit (HOSPITAL_COMMUNITY)
Admission: RE | Admit: 2018-09-24 | Discharge: 2018-09-24 | Disposition: A | Discharge status: Home / Self Care | Payer: BC Managed Care – PPO | Source: Ambulatory Visit | Attending: Orthopaedic Surgery | Admitting: Orthopaedic Surgery

## 2018-09-24 DIAGNOSIS — Z1159 Encounter for screening for other viral diseases: Secondary | ICD-10-CM | POA: Diagnosis not present

## 2018-09-26 ENCOUNTER — Other Ambulatory Visit: Payer: Self-pay

## 2018-09-26 ENCOUNTER — Encounter (HOSPITAL_COMMUNITY): Payer: Self-pay | Admitting: *Deleted

## 2018-09-26 LAB — NOVEL CORONAVIRUS, NAA (HOSP ORDER, SEND-OUT TO REF LAB; TAT 18-24 HRS): SARS-CoV-2, NAA: NOT DETECTED

## 2018-09-26 NOTE — Progress Notes (Signed)
Sylvia Maxwell denies chest pain or shortnes of breath. Sylvia Maxwell had COVID test 09/14/2018 and test was negative. Patient has been on quarantine since with no S/S of COVID. I instructed patient that she is to not eat food after midnight.   I instructed patient that she may drink clear liquids until  4:30 am, but can have clear liquids until 4:40 am.  We went over clear liquids Carbonated Beverages, Water, Clear Tea, Black Coffee only, Juice (non-citric and without pulp), Gatorade, Plain Jell-O  only, Plain Popsicles.  Patient voiced under standing.  I asked patient if someone could pick up a Ensure Pre Surgery drink, she said yes. I instructed patient to drink Ensure Pre Surgery betrween 4:00 and 4:430 AM then no more fluids.

## 2018-09-27 ENCOUNTER — Ambulatory Visit (HOSPITAL_COMMUNITY): Payer: BC Managed Care – PPO | Admitting: Anesthesiology

## 2018-09-27 ENCOUNTER — Ambulatory Visit (HOSPITAL_COMMUNITY)
Admission: RE | Admit: 2018-09-27 | Discharge: 2018-09-27 | Disposition: A | Discharge status: Home / Self Care | Payer: BC Managed Care – PPO | Attending: Orthopaedic Surgery | Admitting: Orthopaedic Surgery

## 2018-09-27 ENCOUNTER — Ambulatory Visit (HOSPITAL_COMMUNITY): Payer: BC Managed Care – PPO

## 2018-09-27 ENCOUNTER — Encounter (HOSPITAL_COMMUNITY): Payer: Self-pay | Admitting: Anesthesiology

## 2018-09-27 ENCOUNTER — Encounter (HOSPITAL_COMMUNITY)
Admission: RE | Disposition: A | Discharge status: Home / Self Care | Payer: Self-pay | Source: Home / Self Care | Attending: Orthopaedic Surgery

## 2018-09-27 DIAGNOSIS — Z419 Encounter for procedure for purposes other than remedying health state, unspecified: Secondary | ICD-10-CM

## 2018-09-27 DIAGNOSIS — Z79899 Other long term (current) drug therapy: Secondary | ICD-10-CM | POA: Insufficient documentation

## 2018-09-27 DIAGNOSIS — Z87891 Personal history of nicotine dependence: Secondary | ICD-10-CM | POA: Insufficient documentation

## 2018-09-27 DIAGNOSIS — S92002A Unspecified fracture of left calcaneus, initial encounter for closed fracture: Secondary | ICD-10-CM | POA: Diagnosis present

## 2018-09-27 DIAGNOSIS — W11XXXA Fall on and from ladder, initial encounter: Secondary | ICD-10-CM | POA: Diagnosis not present

## 2018-09-27 DIAGNOSIS — S86312A Strain of muscle(s) and tendon(s) of peroneal muscle group at lower leg level, left leg, initial encounter: Secondary | ICD-10-CM | POA: Diagnosis not present

## 2018-09-27 DIAGNOSIS — Z7982 Long term (current) use of aspirin: Secondary | ICD-10-CM | POA: Diagnosis not present

## 2018-09-27 DIAGNOSIS — F329 Major depressive disorder, single episode, unspecified: Secondary | ICD-10-CM | POA: Insufficient documentation

## 2018-09-27 HISTORY — DX: Pneumonia, unspecified organism: J18.9

## 2018-09-27 HISTORY — PX: OPEN REDUCTION, INTERNAL FIXATION (ORIF) CALCANEAL FRACTURE WITH FUSION: SHX5994

## 2018-09-27 HISTORY — DX: Inflammatory liver disease, unspecified: K75.9

## 2018-09-27 LAB — COMPREHENSIVE METABOLIC PANEL
ALT: 21 U/L (ref 0–44)
AST: 22 U/L (ref 15–41)
Albumin: 4.5 g/dL (ref 3.5–5.0)
Alkaline Phosphatase: 79 U/L (ref 38–126)
Anion gap: 12 (ref 5–15)
BUN: 9 mg/dL (ref 6–20)
CO2: 21 mmol/L — ABNORMAL LOW (ref 22–32)
Calcium: 9.9 mg/dL (ref 8.9–10.3)
Chloride: 104 mmol/L (ref 98–111)
Creatinine, Ser: 0.57 mg/dL (ref 0.44–1.00)
GFR calc Af Amer: >60 mL/min (ref >60)
GFR calc non Af Amer: >60 mL/min (ref >60)
Glucose, Bld: 89 mg/dL (ref 70–99)
Potassium: 3.7 mmol/L (ref 3.5–5.1)
Sodium: 137 mmol/L (ref 135–145)
Total Bilirubin: 0.6 mg/dL (ref 0.3–1.2)
Total Protein: 8.3 g/dL — ABNORMAL HIGH (ref 6.5–8.1)

## 2018-09-27 LAB — CBC
HCT: 49.1 % — ABNORMAL HIGH (ref 36.0–46.0)
Hemoglobin: 16.2 g/dL — ABNORMAL HIGH (ref 12.0–15.0)
MCH: 32.5 pg (ref 26.0–34.0)
MCHC: 33 g/dL (ref 30.0–36.0)
MCV: 98.4 fL (ref 80.0–100.0)
Platelets: 325 10*3/uL (ref 150–400)
RBC: 4.99 MIL/uL (ref 3.87–5.11)
RDW: 12.2 % (ref 11.5–15.5)
WBC: 6.6 10*3/uL (ref 4.0–10.5)
nRBC: 0 % (ref 0.0–0.2)

## 2018-09-27 LAB — POCT PREGNANCY, URINE: Preg Test, Ur: NEGATIVE

## 2018-09-27 LAB — SURGICAL PCR SCREEN
MRSA, PCR: NEGATIVE
Staphylococcus aureus: POSITIVE — AB

## 2018-09-27 SURGERY — OPEN REDUCTION, INTERNAL FIXATION (ORIF) CALCANEAL FRACTURE WITH FUSION
Anesthesia: General | Site: Ankle | Laterality: Left

## 2018-09-27 MED ORDER — ACETAMINOPHEN 160 MG/5ML PO SOLN: 325.0000 mg | Freq: Once | ORAL | Status: DC | PRN

## 2018-09-27 MED ORDER — PROMETHAZINE HCL 25 MG/ML IJ SOLN: 6.2500 mg | INTRAMUSCULAR | Status: DC | PRN

## 2018-09-27 MED ORDER — DEXAMETHASONE SODIUM PHOSPHATE 10 MG/ML IJ SOLN
INTRAMUSCULAR | Status: DC | PRN
  Administered 2018-09-27: 5 mg via INTRAVENOUS

## 2018-09-27 MED ORDER — PROPOFOL 10 MG/ML IV BOLUS
INTRAVENOUS | Status: AC
  Filled 2018-09-27: qty 20

## 2018-09-27 MED ORDER — VANCOMYCIN HCL 500 MG IV SOLR: INTRAVENOUS | Status: DC | PRN

## 2018-09-27 MED ORDER — LACTATED RINGERS IV SOLN: INTRAVENOUS | Status: DC

## 2018-09-27 MED ORDER — ACETAMINOPHEN 325 MG PO TABS: 325.0000 mg | ORAL_TABLET | Freq: Once | ORAL | Status: DC | PRN

## 2018-09-27 MED ORDER — ONDANSETRON HCL 4 MG/2ML IJ SOLN
INTRAMUSCULAR | Status: DC | PRN
  Administered 2018-09-27: 4 mg via INTRAVENOUS

## 2018-09-27 MED ORDER — FENTANYL CITRATE (PF) 250 MCG/5ML IJ SOLN
INTRAMUSCULAR | Status: AC
  Filled 2018-09-27: qty 5

## 2018-09-27 MED ORDER — MIDAZOLAM HCL 2 MG/2ML IJ SOLN
INTRAMUSCULAR | Status: AC
  Filled 2018-09-27: qty 2

## 2018-09-27 MED ORDER — ROCURONIUM BROMIDE 10 MG/ML (PF) SYRINGE
PREFILLED_SYRINGE | INTRAVENOUS | Status: DC | PRN
  Administered 2018-09-27: 30 mg via INTRAVENOUS

## 2018-09-27 MED ORDER — MIDAZOLAM HCL 2 MG/2ML IJ SOLN
INTRAMUSCULAR | Status: AC
  Administered 2018-09-27: 2 mg
  Filled 2018-09-27: qty 2

## 2018-09-27 MED ORDER — MIDAZOLAM HCL 5 MG/5ML IJ SOLN
INTRAMUSCULAR | Status: DC | PRN
  Administered 2018-09-27: 2 mg via INTRAVENOUS

## 2018-09-27 MED ORDER — CEFAZOLIN SODIUM-DEXTROSE 2-4 GM/100ML-% IV SOLN
INTRAVENOUS | Status: AC
  Filled 2018-09-27: qty 100

## 2018-09-27 MED ORDER — PHENYLEPHRINE 40 MCG/ML (10ML) SYRINGE FOR IV PUSH (FOR BLOOD PRESSURE SUPPORT)
PREFILLED_SYRINGE | INTRAVENOUS | Status: DC | PRN
  Administered 2018-09-27 (×2): 80 ug via INTRAVENOUS

## 2018-09-27 MED ORDER — VANCOMYCIN HCL 1000 MG IV SOLR
INTRAVENOUS | Status: AC
  Filled 2018-09-27: qty 1000

## 2018-09-27 MED ORDER — FENTANYL CITRATE (PF) 100 MCG/2ML IJ SOLN
INTRAMUSCULAR | Status: AC
  Administered 2018-09-27: 50 ug
  Filled 2018-09-27: qty 2

## 2018-09-27 MED ORDER — FENTANYL CITRATE (PF) 250 MCG/5ML IJ SOLN
INTRAMUSCULAR | Status: DC | PRN
  Administered 2018-09-27: 100 ug via INTRAVENOUS
  Administered 2018-09-27 (×3): 50 ug via INTRAVENOUS

## 2018-09-27 MED ORDER — SCOPOLAMINE 1 MG/3DAYS TD PT72
MEDICATED_PATCH | TRANSDERMAL | Status: DC | PRN
  Administered 2018-09-27: 1 via TRANSDERMAL

## 2018-09-27 MED ORDER — OXYCODONE HCL 5 MG PO TABS: 5.0000 mg | ORAL_TABLET | ORAL | 0 refills | Status: AC | PRN

## 2018-09-27 MED ORDER — SODIUM CHLORIDE 0.9 % IV SOLN
INTRAVENOUS | Status: DC | PRN
  Administered 2018-09-27: 10 ug/min via INTRAVENOUS

## 2018-09-27 MED ORDER — PROPOFOL 10 MG/ML IV BOLUS
INTRAVENOUS | Status: DC | PRN
  Administered 2018-09-27: 140 mg via INTRAVENOUS

## 2018-09-27 MED ORDER — LIDOCAINE 2% (20 MG/ML) 5 ML SYRINGE
INTRAMUSCULAR | Status: DC | PRN
  Administered 2018-09-27: 60 mg via INTRAVENOUS

## 2018-09-27 MED ORDER — MEPERIDINE HCL 25 MG/ML IJ SOLN: 6.2500 mg | INTRAMUSCULAR | Status: DC | PRN

## 2018-09-27 MED ORDER — HYDROMORPHONE HCL 1 MG/ML IJ SOLN: 0.2500 mg | INTRAMUSCULAR | Status: DC | PRN

## 2018-09-27 MED ORDER — BUPIVACAINE HCL (PF) 0.25 % IJ SOLN
INTRAMUSCULAR | Status: AC
  Filled 2018-09-27: qty 30

## 2018-09-27 MED ORDER — SUCCINYLCHOLINE CHLORIDE 200 MG/10ML IV SOSY
PREFILLED_SYRINGE | INTRAVENOUS | Status: DC | PRN
  Administered 2018-09-27: 120 mg via INTRAVENOUS

## 2018-09-27 MED ORDER — ACETAMINOPHEN 10 MG/ML IV SOLN: 1000.0000 mg | Freq: Once | INTRAVENOUS | Status: DC | PRN

## 2018-09-27 MED ORDER — LACTATED RINGERS IV SOLN
INTRAVENOUS | Status: DC
  Administered 2018-09-27 (×2): via INTRAVENOUS

## 2018-09-27 MED ORDER — SCOPOLAMINE 1 MG/3DAYS TD PT72
MEDICATED_PATCH | TRANSDERMAL | Status: AC
  Filled 2018-09-27: qty 1

## 2018-09-27 MED ORDER — FENTANYL CITRATE (PF) 100 MCG/2ML IJ SOLN: 50.0000 ug | Freq: Once | INTRAMUSCULAR | Status: DC

## 2018-09-27 MED ORDER — CEFAZOLIN SODIUM-DEXTROSE 2-4 GM/100ML-% IV SOLN
2.0000 g | INTRAVENOUS | Status: AC
  Administered 2018-09-27: 2 g via INTRAVENOUS

## 2018-09-27 MED ORDER — MIDAZOLAM HCL 2 MG/2ML IJ SOLN: 2.0000 mg | Freq: Once | INTRAMUSCULAR | Status: DC

## 2018-09-27 MED ORDER — 0.9 % SODIUM CHLORIDE (POUR BTL) OPTIME
TOPICAL | Status: DC | PRN
  Administered 2018-09-27: 1000 mL

## 2018-09-27 MED ORDER — BUPIVACAINE-EPINEPHRINE (PF) 0.25% -1:200000 IJ SOLN
INTRAMUSCULAR | Status: AC
  Filled 2018-09-27: qty 30

## 2018-09-27 MED ORDER — BUPIVACAINE-EPINEPHRINE (PF) 0.5% -1:200000 IJ SOLN
INTRAMUSCULAR | Status: DC | PRN
  Administered 2018-09-27: 30 mL via PERINEURAL

## 2018-09-27 MED ORDER — CHLORHEXIDINE GLUCONATE 4 % EX LIQD: 60.0000 mL | Freq: Once | CUTANEOUS | Status: DC

## 2018-09-27 SURGICAL SUPPLY — 65 items
BANDAGE ESMARK 6X9 LF (GAUZE/BANDAGES/DRESSINGS) IMPLANT
BENZOIN TINCTURE PRP APPL 2/3 (GAUZE/BANDAGES/DRESSINGS) IMPLANT
BIT DRILL 2.5 CANN STRL (BIT) ×2 IMPLANT
BLADE SURG 15 STRL LF DISP TIS (BLADE) ×2 IMPLANT
BLADE SURG 15 STRL SS (BLADE) ×4
BNDG ELASTIC 6X10 VLCR STRL LF (GAUZE/BANDAGES/DRESSINGS) ×3 IMPLANT
BNDG ESMARK 6X9 LF (GAUZE/BANDAGES/DRESSINGS) ×3
BONE CANC CHIPS 20CC PCAN1/4 (Bone Implant) ×3 IMPLANT
CHIPS CANC BONE 20CC PCAN1/4 (Bone Implant) ×1 IMPLANT
CHLORAPREP W/TINT 26ML (MISCELLANEOUS) ×3 IMPLANT
CLOSURE WOUND 1/2 X4 (GAUZE/BANDAGES/DRESSINGS)
COVER WAND RF STERILE (DRAPES) IMPLANT
CUFF TOURNIQUET SINGLE 34IN LL (TOURNIQUET CUFF) ×3 IMPLANT
DECANTER SPIKE VIAL GLASS SM (MISCELLANEOUS) ×2 IMPLANT
DRAPE C-ARM 42X72 X-RAY (DRAPES) ×3 IMPLANT
DRAPE C-ARMOR (DRAPES) ×3 IMPLANT
DRAPE EXTREMITY T 121X128X90 (DISPOSABLE) ×3 IMPLANT
DRAPE IMP U-DRAPE 54X76 (DRAPES) ×3 IMPLANT
DRAPE U-SHAPE 47X51 STRL (DRAPES) ×3 IMPLANT
ELECT REM PT RETURN 9FT ADLT (ELECTROSURGICAL) ×3
ELECTRODE REM PT RTRN 9FT ADLT (ELECTROSURGICAL) ×1 IMPLANT
GAUZE SPONGE 4X4 12PLY STRL (GAUZE/BANDAGES/DRESSINGS) ×3 IMPLANT
GAUZE SPONGE 4X4 12PLY STRL LF (GAUZE/BANDAGES/DRESSINGS) ×3 IMPLANT
GAUZE XEROFORM 5X9 LF (GAUZE/BANDAGES/DRESSINGS) ×2 IMPLANT
GLOVE BIOGEL M STRL SZ7.5 (GLOVE) ×3 IMPLANT
GLOVE BIOGEL PI IND STRL 8 (GLOVE) ×1 IMPLANT
GLOVE BIOGEL PI INDICATOR 8 (GLOVE) ×2
GOWN STRL REUS W/ TWL LRG LVL3 (GOWN DISPOSABLE) ×1 IMPLANT
GOWN STRL REUS W/ TWL XL LVL3 (GOWN DISPOSABLE) ×1 IMPLANT
GOWN STRL REUS W/TWL LRG LVL3 (GOWN DISPOSABLE) ×2
GOWN STRL REUS W/TWL XL LVL3 (GOWN DISPOSABLE) ×4
GRAFT BNE CANC CHIPS 1-8 20CC (Bone Implant) IMPLANT
GUIDEWIRE W/TRCR TIP 2.4X9.25 (WIRE) ×4 IMPLANT
KIT BASIN OR (CUSTOM PROCEDURE TRAY) ×3 IMPLANT
NEEDLE 22X1 1/2 (OR ONLY) (NEEDLE) ×2 IMPLANT
NS IRRIG 1000ML POUR BTL (IV SOLUTION) ×3 IMPLANT
PACK ORTHO EXTREMITY (CUSTOM PROCEDURE TRAY) ×3 IMPLANT
PAD ABD 8X10 STRL (GAUZE/BANDAGES/DRESSINGS) IMPLANT
PAD CAST 4YDX4 CTTN HI CHSV (CAST SUPPLIES) ×1 IMPLANT
PADDING CAST COTTON 4X4 STRL (CAST SUPPLIES) ×2
PADDING CAST SYNTHETIC 4 (CAST SUPPLIES) ×2
PADDING CAST SYNTHETIC 4X4 STR (CAST SUPPLIES) ×1 IMPLANT
PLATE CALC PERC TI STD LT (Plate) ×2 IMPLANT
SCREW CORT FT 3.5X28 (Screw) ×2 IMPLANT
SCREW LOCK FT SD 3.5X28 (Screw) ×4 IMPLANT
SCREW LOCK T15 FT 30X3.5XST (Screw) IMPLANT
SCREW LOCKING 3.5X30MM (Screw) ×4 IMPLANT
SCREW LP TIT 3.5X30 (Screw) ×2 IMPLANT
SCREW LP TIT 3.5X36 (Screw) ×2 IMPLANT
SPONGE LAP 18X18 RF (DISPOSABLE) IMPLANT
STRIP CLOSURE SKIN 1/2X4 (GAUZE/BANDAGES/DRESSINGS) IMPLANT
SUCTION FRAZIER HANDLE 10FR (MISCELLANEOUS) ×2
SUCTION TUBE FRAZIER 10FR DISP (MISCELLANEOUS) ×1 IMPLANT
SUT ETHILON 3 0 PS 1 (SUTURE) ×3 IMPLANT
SUT FIBERWIRE 2-0 18 17.9 3/8 (SUTURE) ×6
SUT MNCRL AB 3-0 PS2 18 (SUTURE) ×3 IMPLANT
SUT PDS AB 2-0 CT2 27 (SUTURE) ×3 IMPLANT
SUT VIC AB 0 CT1 27 (SUTURE)
SUT VIC AB 0 CT1 27XBRD ANBCTR (SUTURE) IMPLANT
SUTURE FIBERWR 2-0 18 17.9 3/8 (SUTURE) IMPLANT
SYR CONTROL 10ML LL (SYRINGE) ×2 IMPLANT
TOWEL GREEN STERILE FF (TOWEL DISPOSABLE) ×6 IMPLANT
TUBE CONNECTING 20'X1/4 (TUBING) ×2
TUBE CONNECTING 20X1/4 (TUBING) ×4 IMPLANT
UNDERPAD 30X30 (UNDERPADS AND DIAPERS) ×3 IMPLANT

## 2018-09-27 NOTE — H&P (Signed)
Sylvia Maxwell is an 33 y.o. female.   Chief Complaint: Left calcaneus fracture, displaced and depressed HPI: Sylvia Maxwell is here today for surgical intervention for her calcaneus fracture.  On May 15 she stepped off of a ladder and had immediate pain and deformity in her left foot.  She was seen in the emergency department where x-rays revealed a displaced and depressed calcaneus fracture.  Subsequent follow-up was delayed due to patient feeling ill.  Ultimately she arrived in clinic where CT scan was obtained.  CT scan showed a significantly depressed posterior facet of the calcaneus and displacement.  She was indicated for operative treatment.  We did discuss the risk benefits alternatives of surgery which she agreed to proceed with.  She is here today for surgery.  She denies any recent fevers or chills.  She continues have pain in the left foot.  She states she is trying to maintain nonweightbearing but is using her foot for balance.  Her pain has been decreasing.  She has been taking a full-strength aspirin for DVT prophylaxis for the past week.  Past Medical History:  Diagnosis Date  . Alcoholism ([place])   . Bronchitis   . Depression   . Hepatitis    ? carrier  .01- HEP C - was told when she tried to give blood  . Pneumonia    walking pneumonia   aspiriation Pneumonia 05/2017  . Seizures ([place])    alcohol withdrawl    Past Surgical History:  Procedure Laterality Date  . BREAST ENHANCEMENT SURGERY  2008  . DILATION AND EVACUATION N/A 04/15/2018   Procedure: DILATATION AND EVACUATION;  Surgeon: Osborne Oman, MD;  Location: [facility];  Service: Gynecology;  Laterality: N/A;    Family History  Family history unknown: Yes   Social History:  reports that she quit smoking about 16 months ago. Her smoking use included cigarettes. She quit after 10.00 years of use. She has never used smokeless tobacco. She reports previous alcohol use. She reports previous drug use.  Allergies:  Allergies  Allergen  Reactions  . Lactose Intolerance (Gi) Diarrhea and Nausea And Vomiting    Depends on the type of dairy-based product consumed as to which reaction occurs    Medications Prior to Admission  Medication Sig Dispense Refill  . aspirin 325 MG EC tablet Take 325 mg by mouth daily.    . cetirizine (ZYRTEC) 10 MG tablet Take 10 mg by mouth daily.    Marland Kitchen FLUoxetine (PROZAC) 20 MG capsule Take 20 mg by mouth daily.     . fluticasone (FLONASE) 50 MCG/ACT nasal spray Place 1 spray into both nostrils daily.    Marland Kitchen gabapentin (NEURONTIN) 400 MG capsule Take 400 mg by mouth 3 (three) times daily.    . Multiple Vitamins-Minerals (MULTIVITAMIN WITH MINERALS) tablet Take 1 tablet by mouth daily.    Marland Kitchen HYDROcodone-acetaminophen (NORCO/VICODIN) 5-325 MG tablet Take 1 tablet by mouth every 6 (six) hours as needed. (Patient not taking: Reported on 09/23/2018) 10 tablet 0  . ibuprofen (ADVIL) 800 MG tablet Take 1 tablet (800 mg total) by mouth 3 (three) times daily. (Patient not taking: Reported on 09/23/2018) 21 tablet 0    No results found for this or any previous visit (from the past 48 hour(s)). No results found.  Review of Systems  Constitutional: Negative.   HENT: Negative.   Eyes: Negative.   Respiratory: Negative.   Cardiovascular: Negative.   Gastrointestinal: Negative.   Musculoskeletal:  Left foot pain  Skin: Negative.   Neurological: Negative.   Endo/Heme/Allergies: Negative.   Psychiatric/Behavioral: Negative.     Blood pressure 103/63, pulse 66, temperature 98.4 F (36.9 C), temperature source Oral, resp. rate 17, height 5\' 6"  (1.676 m), weight 65.8 kg, last menstrual period 08/31/2018, SpO2 99 %, unknown if currently breastfeeding. Physical Exam  Constitutional: She appears well-developed.  HENT:  Head: Normocephalic.  Eyes: Conjunctivae are normal.  Neck: Neck supple.  Cardiovascular: Normal rate.  Respiratory: Effort normal.  GI: Soft.  Musculoskeletal:     Comments: Left foot  wrapped in a splint.  Toes exposed are warm and well-perfused.  Endorses sensation of the toes.  There is some wrinkling of the toes indicative of decreased swelling.  No tenderness palpation proximal to the splint.  Endorses sensation the dorsal and plantar foot.  Palpable dorsalis pedis pulse.  Neurological: She is alert.  Skin: Skin is warm.  Psychiatric: She has a normal mood and affect.     Assessment/Plan We will proceed with ORIF of her left calcaneus and exploration of her peroneal tendons.  Her tendons on the CT scan are not subluxated and there is no fleck sign but will check to be sure.  She understands the risk, benefits alternatives of surgery which include but are not limited to wound healing complications, infection, nonunion, malunion, pain, stiffness, need for further surgery, damage surrounding structures.  She also understands the increased risk with all of these given that she is a prior smoker.  Patient states she has not smoked a cigarette in 2 months.  After weighing these risks she wishes to proceed with surgery.  We also discussed the perioperative and anesthetic risk which include death.  She understands the postoperative weightbearing restrictions and agrees to comply.  Sylvia Crocker, MD 09/27/2018, 10:49 AM

## 2018-09-27 NOTE — Transfer of Care (Signed)
Immediate Anesthesia Transfer of Care Note  Patient: Sylvia Maxwell  Procedure(s) Performed: OPEN REDUCTION, INTERNAL FIXATION (ORIF) CALCANEAL FRACTURE AND EXPLORE PERONEAL TENDONS (Left Ankle)  Patient Location: PACU  Anesthesia Type:GA combined with regional for post-op pain  Level of Consciousness: awake, alert  and oriented  Airway & Oxygen Therapy: Patient Spontanous Breathing and Patient connected to nasal cannula oxygen  Post-op Assessment: Report given to RN and Post -op Vital signs reviewed and stable  Post vital signs: Reviewed and stable  Last Vitals:  Vitals Value Taken Time  BP 108/58 09/27/18 1408  Temp    Pulse 84 09/27/18 1413  Resp 16 09/27/18 1413  SpO2 100 % 09/27/18 1413  Vitals shown include unvalidated device data.  Last Pain:  Vitals:   09/27/18 1409  TempSrc:   PainSc: (P) 0-No pain      Patients Stated Pain Goal: 6 (40/37/09 6438)  Complications: No apparent anesthesia complications

## 2018-09-27 NOTE — Anesthesia Postprocedure Evaluation (Signed)
Anesthesia Post Note  Patient: Sylvia Maxwell  Procedure(s) Performed: OPEN REDUCTION, INTERNAL FIXATION (ORIF) CALCANEAL FRACTURE AND EXPLORE PERONEAL TENDONS (Left Ankle)     Patient location during evaluation: PACU Anesthesia Type: General and Regional Level of consciousness: awake and alert Pain management: pain level controlled Vital Signs Assessment: post-procedure vital signs reviewed and stable Respiratory status: spontaneous breathing, nonlabored ventilation, respiratory function stable and patient connected to nasal cannula oxygen Cardiovascular status: blood pressure returned to baseline and stable Postop Assessment: no apparent nausea or vomiting Anesthetic complications: no    Last Vitals:  Vitals:   09/27/18 1453 09/27/18 1500  BP: 123/68   Pulse: 85 73  Resp: 19 10  Temp:  (!) 36.2 C  SpO2: 97% 97%    Last Pain:  Vitals:   09/27/18 1500  TempSrc:   PainSc: 0-No pain                 Effie Berkshire

## 2018-09-27 NOTE — Discharge Instructions (Signed)
DR. Lucia Gaskins FOOT & ANKLE SURGERY POST-OP INSTRUCTIONS   Pain Management 1. The numbing medicine and your leg will last around 8 hours, take a dose of your pain medicine as soon as you feel it wearing off to avoid rebound pain. 2. Keep your foot elevated above heart level.  Make sure that your heel hangs free ('floats'). 3. Take all prescribed medication as directed. 4. If taking narcotic pain medication you may want to use an over-the-counter stool softener to avoid constipation. 5. You may take over-the-counter NSAIDs (ibuprofen, naproxen, etc.) as well as over-the-counter acetaminophen as directed on the packaging as a supplement for your pain and may also use it to wean away from the prescription medication.  Activity ? Non-weightbearing ? Postoperatively, you will be placed into a splint which stays on for 2 weeks and then will be changed at your first postop visit.  First Postoperative Visit 1. Your first postop visit will be at least 2 weeks after surgery.  This should be scheduled when you schedule surgery. 2. If you do not have a postoperative visit scheduled please call [phone] to schedule an appointment. 3. At the appointment your incision will be evaluated for suture removal, x-rays will be obtained if necessary.  General Instructions 1. Swelling is very common after foot and ankle surgery.  It often takes 3 months for the foot and ankle to begin to feel comfortable.  Some amount of swelling will persist for 6-12 months. 2. DO NOT change the dressing.  If there is a problem with the dressing (too tight, loose, gets wet, etc.) please contact Dr. Pollie Friar office. 3. DO NOT get the dressing wet.  For showers you can use an over-the-counter cast cover or wrap a washcloth around the top of your dressing and then cover it with a plastic bag and tape it to your leg. 4. DO NOT soak the incision (no tubs, pools, bath, etc.) until you have approval from Dr. Lucia Gaskins.  Contact Dr. Huel Cote  office or go to Emergency Room if: 1. Temperature above 101 F. 2. Increasing pain that is unresponsive to pain medication or elevation 3. Excessive redness or swelling in your foot 4. Dressing problems - excessive bloody drainage, looseness or tightness, or if dressing gets wet 5. Develop pain, swelling, warmth, or discoloration of your calf

## 2018-09-27 NOTE — Anesthesia Procedure Notes (Signed)
Anesthesia Regional Block: Popliteal block   Pre-Anesthetic Checklist: ,, timeout performed, Correct Patient, Correct Site, Correct Laterality, Correct Procedure, Correct Position, site marked, Risks and benefits discussed,  Surgical consent,  Pre-op evaluation,  At surgeon's request and post-op pain management  Laterality: Left  Prep: chloraprep       Needles:  Injection technique: Single-shot  Needle Type: Echogenic Stimulator Needle     Needle Length: 9cm  Needle Gauge: 21     Additional Needles:   Procedures:,,,, ultrasound used (permanent image in chart),,,,  Narrative:  Start time: 09/27/2018 11:15 AM End time: 09/27/2018 11:25 AM Injection made incrementally with aspirations every 5 mL.  Performed by: Personally  Anesthesiologist: Effie Berkshire, MD  Additional Notes: Patient tolerated the procedure well. Local anesthetic introduced in an incremental fashion under minimal resistance after negative aspirations. No paresthesias were elicited. After completion of the procedure, no acute issues were identified and patient continued to be monitored by RN.

## 2018-09-27 NOTE — Anesthesia Preprocedure Evaluation (Addendum)
Anesthesia Evaluation  Patient identified by MRN, date of birth, ID band Patient awake    Reviewed: Allergy & Precautions, NPO status , Patient's Chart, lab work & pertinent test results  Airway Mallampati: I  TM Distance: >3 FB Neck ROM: Full    Dental  (+) Teeth Intact, Dental Advisory Given   Pulmonary former smoker,    breath sounds clear to auscultation       Cardiovascular negative cardio ROS   Rhythm:Regular Rate:Normal     Neuro/Psych Seizures -,  Depression    GI/Hepatic negative GI ROS, (+) Hepatitis -, C  Endo/Other  negative endocrine ROS  Renal/GU      Musculoskeletal negative musculoskeletal ROS (+)   Abdominal Normal abdominal exam  (+)   Peds  Hematology negative hematology ROS (+)   Anesthesia Other Findings   Reproductive/Obstetrics                            Anesthesia Physical Anesthesia Plan  ASA: II  Anesthesia Plan: General   Post-op Pain Management: GA combined w/ Regional for post-op pain   Induction: Intravenous  PONV Risk Score and Plan: 4 or greater and Ondansetron, Dexamethasone, Midazolam and Scopolamine patch - Pre-op  Airway Management Planned: Oral ETT  Additional Equipment: None  Intra-op Plan:   Post-operative Plan: Extubation in OR  Informed Consent: I have reviewed the patients History and Physical, chart, labs and discussed the procedure including the risks, benefits and alternatives for the proposed anesthesia with the patient or authorized representative who has indicated his/her understanding and acceptance.     Dental advisory given  Plan Discussed with: CRNA  Anesthesia Plan Comments: (COVID-19 Labs  No results for input(s): DDIMER, FERRITIN, LDH, CRP in the last 72 hours.  Lab Results      Component                Value               Date                      [id number]              NOT DETECTED        09/24/2018             )       Anesthesia Quick Evaluation

## 2018-09-27 NOTE — Op Note (Signed)
Sylvia Maxwell female 33 y.o. 09/27/2018  PreOperative Diagnosis: Left depressed and displaced calcaneus fracture  PostOperative Diagnosis: Left depressed and displaced calcaneus fracture Peroneus brevis tendon tear Superior peroneal retinaculum tear  PROCEDURE: Open reduction internal fixation of left calcaneus Repair of peroneus brevis tendon Repair of superior peroneal retinaculum  SURGEON: Melony Overly, MD  ASSISTANT: None  ANESTHESIA: General ETT with peripheral nerve block  FINDINGS: Left depressed displaced calcaneus fracture Longitudinal split tear peroneus brevis tendon Tear of superior peroneal retinaculum  IMPLANTS: Arthrex calcaneus locking plate  OVZCHYIFOYD:33 y.o. female fell from a ladder 3 weeks ago.  She was initially seen in the emergency department and diagnosed with a displaced calcaneus fracture with significant depression of her posterior facet.  She was placed in a splint.  She missed her initial follow-up appointment and showed up approximately 3 weeks after her injury.  CT scan was obtained and based on the findings she was indicated for open treatment of her calcaneus fracture.  We discussed the risk, benefits and alternatives to surgery which included but were not limited to wound healing complications, infection, need for further surgery, malunion, nonunion, continued pain, stiffness.  We also discussed the anesthetic and perioperative risks which include death.  She understood the weightbearing restrictions and opted to proceed with surgery.  We discussed inspecting the peroneal tendons given the significance of her calcaneus fracture and the possibility of peroneal tendon tear with this mechanism.  She understood that and agreed to proceed.  PROCEDURE: Patient was identified in the preoperative holding area.  The left leg was marked by myself.  The consent was himself and the patient.  Anesthesia performed a peripheral nerve block.  She was taken to  the operative suite and general anesthesia was induced without difficulty.  She was then placed in the lateral position after a thigh tourniquet was placed on the left thigh.  All bony prominences were well-padded.  A platform was made for the left foot in the lateral position for positioning.  The left lower extremity was prepped and draped in the usual sterile fashion.  The left leg was elevated and the thigh tourniquet was inflated to 250 mmHg.  Preoperative antibiotics were given.  A timeout was performed.  We began by making a longitudinal incision in line with the fourth ray from the tip of the lateral malleolus about the sinus Tarsi region.  This was taken sharply down through skin and subcutaneous tissue.  Blunt dissection was used to mobilize any branches of the sural nerve that were not identified.  The peroneal tendons were identified and the sheath was opened up.  Then a Valora Corporal was placed up into the area of the superior peroneal retinaculum and it was found to be torn and attenuated.  The incision was carried more proximally about the retro-fibular groove area.  The peroneal tendon sheath was incised in line with the tendons proximally.  The tendons were dislocated and there is a large 4 cm longitudinal split within the central portion of the peroneus brevis tendon.  Adhesions between the peroneus brevis and longus were excised sharply with 15 blade and with scissor dissection.  We then carried down the dissection into the sinus Tarsi mobilizing the subtalar joint.  The calcaneofibular ligament was cut.  The subtalar joint was entered and cleaned of fracture hematoma, fibrinous tissue with a rondure.  The posterior calcaneus facet was identified and found to be significantly depressed.  There is fracture lines within the lateral wall.  The soft  tissue overlying the lateral wall was elevated with a periosteal elevator and the fracture site was mobilized about the lateral wall.  We are then able to  mobilize the posterior facet fragment and reduce it directly under direct visualization.  It was held provisionally with K wires.  Then fluoroscopy was used to confirm adequate reduction of the posterior facet of the calcaneus.  There was a large bony void at the level of the depression and cancellus allograft bone chips were used to fill the void.  These were packed and with a bone tamp.  Then the lateral wall was replaced and reduced.  Then an Arthrex minimally invasive locking plate was used with a combination of nonlocking and locking screws to fix the fracture.  Fluoroscopy was used again to confirm appropriate screw length and position.  The subtalar joint was taken through range of motion with the K wires removed and there was no evidence of screw penetration into the joint.  We then turned our attention back to the peroneal tendons.  Using a 2-0 FiberWire stitch the tear within the peroneus brevis tendon was repaired centrally.  Then the tendon was tubularized over top of the repair to add to the repair.  The tendons were then redislocated and the soft tissue was closed over top of the plate in the area of the peroneal tendons.  This was closed with a 2-0 PDS suture.  Then the tendons were relocated in the superior peroneal retinaculum was repaired using a 2-0 FiberWire stitch in interrupted fashion.  Valora Corporal was used to ensure no overtightening of the superior peroneal retinaculum.  The tendons were allowed to glide freely within the groove.  Then the remaining peroneal tendon sheath was closed with a 2-0 PDS suture.  The wound was irrigated copiously and the tourniquet was released.  Tourniquet time was 76 minutes.  The subcuticular tissue was closed with a 3-0 Monocryl and the skin with staples.  All counts were correct at the end the case.  She tolerated this well.  Soft dressing of Xeroform, 4 x 4's and sterile she cotton were used.  A nonweightbearing short leg plaster splint was placed.  She was  awakened from anesthesia and taken to recovery in stable condition.  POST OPERATIVE INSTRUCTIONS: Nonweightbearing to left lower extremity Keep splint dry and limb elevated Call the office with concerns Follow-up in 2 weeks for splint removal, wound check, staple removal if appropriate and x-rays of the left foot nonweightbearing Continue taking 1 325 mg aspirin for DVT prophylaxis she will remain nonweightbearing approximately 8 weeks.  We will transition her into a walking boot about the 6-week mark and begin physical therapy.  TOURNIQUET TIME: 76 minutes  BLOOD LOSS:  less than 50 mL         DRAINS: none         SPECIMEN: none       COMPLICATIONS:  * No complications entered in OR log *         Disposition: PACU - hemodynamically stable.         Condition: stable

## 2018-09-27 NOTE — Anesthesia Procedure Notes (Signed)
Procedure Name: Intubation Date/Time: 09/27/2018 12:04 PM Performed by: Marsa Aris, CRNA Pre-anesthesia Checklist: Patient identified, Emergency Drugs available, Suction available and Patient being monitored Patient Re-evaluated:Patient Re-evaluated prior to induction Oxygen Delivery Method: Circle System Utilized Preoxygenation: Pre-oxygenation with 100% oxygen Induction Type: IV induction Ventilation: Mask ventilation without difficulty Laryngoscope Size: Miller and 2 Grade View: Grade I Tube type: Oral Number of attempts: 1 Airway Equipment and Method: Stylet and Oral airway Placement Confirmation: ETT inserted through vocal cords under direct vision,  positive ETCO2 and breath sounds checked- equal and bilateral Secured at: 21 cm Tube secured with: Tape Dental Injury: Teeth and Oropharynx as per pre-operative assessment  Comments: No change in dentition from pre-procedure

## 2018-09-28 ENCOUNTER — Encounter (HOSPITAL_COMMUNITY): Payer: Self-pay | Admitting: Orthopaedic Surgery

## 2019-07-18 ENCOUNTER — Emergency Department (HOSPITAL_COMMUNITY)
Admission: EM | Admit: 2019-07-18 | Discharge: 2019-07-19 | Disposition: A | Discharge status: Home / Self Care | Payer: BC Managed Care – PPO | Attending: Emergency Medicine | Admitting: Emergency Medicine

## 2019-07-18 ENCOUNTER — Other Ambulatory Visit: Payer: Self-pay

## 2019-07-18 DIAGNOSIS — F331 Major depressive disorder, recurrent, moderate: Secondary | ICD-10-CM | POA: Insufficient documentation

## 2019-07-18 DIAGNOSIS — F10920 Alcohol use, unspecified with intoxication, uncomplicated: Secondary | ICD-10-CM

## 2019-07-18 DIAGNOSIS — R45851 Suicidal ideations: Secondary | ICD-10-CM | POA: Insufficient documentation

## 2019-07-18 DIAGNOSIS — Z20822 Contact with and (suspected) exposure to covid-19: Secondary | ICD-10-CM | POA: Insufficient documentation

## 2019-07-18 DIAGNOSIS — Z79899 Other long term (current) drug therapy: Secondary | ICD-10-CM | POA: Insufficient documentation

## 2019-07-18 DIAGNOSIS — Z87891 Personal history of nicotine dependence: Secondary | ICD-10-CM | POA: Insufficient documentation

## 2019-07-18 DIAGNOSIS — F10120 Alcohol abuse with intoxication, uncomplicated: Secondary | ICD-10-CM | POA: Insufficient documentation

## 2019-07-18 LAB — COMPREHENSIVE METABOLIC PANEL
ALT: 24 U/L (ref 0–44)
AST: 28 U/L (ref 15–41)
Albumin: 3.9 g/dL (ref 3.5–5.0)
Alkaline Phosphatase: 51 U/L (ref 38–126)
Anion gap: 10 (ref 5–15)
BUN: 7 mg/dL (ref 6–20)
CO2: 24 mmol/L (ref 22–32)
Calcium: 7.4 mg/dL — ABNORMAL LOW (ref 8.9–10.3)
Chloride: 109 mmol/L (ref 98–111)
Creatinine, Ser: 0.53 mg/dL (ref 0.44–1.00)
GFR calc Af Amer: >60 mL/min (ref >60)
GFR calc non Af Amer: >60 mL/min (ref >60)
Glucose, Bld: 82 mg/dL (ref 70–99)
Potassium: 4.7 mmol/L (ref 3.5–5.1)
Sodium: 143 mmol/L (ref 135–145)
Total Bilirubin: 0.5 mg/dL (ref 0.3–1.2)
Total Protein: 6.6 g/dL (ref 6.5–8.1)

## 2019-07-18 LAB — CBC WITH DIFFERENTIAL/PLATELET
Abs Immature Granulocytes: 0.01 10*3/uL (ref 0.00–0.07)
Basophils Absolute: 0 10*3/uL (ref 0.0–0.1)
Basophils Relative: 1 %
Eosinophils Absolute: 0.1 10*3/uL (ref 0.0–0.5)
Eosinophils Relative: 1 %
HCT: 40 % (ref 36.0–46.0)
Hemoglobin: 13.5 g/dL (ref 12.0–15.0)
Immature Granulocytes: 0 %
Lymphocytes Relative: 45 %
Lymphs Abs: 2.6 10*3/uL (ref 0.7–4.0)
MCH: 32.1 pg (ref 26.0–34.0)
MCHC: 33.8 g/dL (ref 30.0–36.0)
MCV: 95 fL (ref 80.0–100.0)
Monocytes Absolute: 0.3 10*3/uL (ref 0.1–1.0)
Monocytes Relative: 5 %
Neutro Abs: 2.8 10*3/uL (ref 1.7–7.7)
Neutrophils Relative %: 48 %
Platelets: 197 10*3/uL (ref 150–400)
RBC: 4.21 MIL/uL (ref 3.87–5.11)
RDW: 13.4 % (ref 11.5–15.5)
WBC: 5.8 10*3/uL (ref 4.0–10.5)
nRBC: 0 % (ref 0.0–0.2)

## 2019-07-18 LAB — I-STAT BETA HCG BLOOD, ED (MC, WL, AP ONLY): I-stat hCG, quantitative: <5 m[IU]/mL (ref <5)

## 2019-07-18 LAB — RESPIRATORY PANEL BY RT PCR (FLU A&B, COVID)
Influenza A by PCR: NEGATIVE
Influenza B by PCR: NEGATIVE
SARS Coronavirus 2 by RT PCR: NEGATIVE

## 2019-07-18 LAB — RAPID URINE DRUG SCREEN, HOSP PERFORMED
Amphetamines: NOT DETECTED
Barbiturates: NOT DETECTED
Benzodiazepines: NOT DETECTED
Cocaine: NOT DETECTED
Opiates: NOT DETECTED
Tetrahydrocannabinol: NOT DETECTED

## 2019-07-18 LAB — ETHANOL: Alcohol, Ethyl (B): 344 mg/dL (ref <10)

## 2019-07-18 MED ORDER — SODIUM CHLORIDE 0.9 % IV BOLUS
1000.0000 mL | Freq: Once | INTRAVENOUS | Status: AC
  Administered 2019-07-18: 21:00:00 1000 mL via INTRAVENOUS
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
## 2019-07-19 MED ORDER — LIP MEDEX EX OINT
TOPICAL_OINTMENT | Freq: Once | CUTANEOUS | Status: AC
  Filled 2019-07-19: qty 7

## 2019-07-19 NOTE — ED Notes (Signed)
Pt ambulatory to bathroom, no assistance needed.  Pt denies SI/HI at this time.  Pt calm and cooperative.

## 2019-07-19 NOTE — ED Notes (Signed)
TTS will assess patient when the ETOH level is lower

## 2019-07-19 NOTE — Discharge Instructions (Signed)
Please follow-up with your regular doctor regarding symptoms from today.  Return to ER if you have any additional thoughts of hurting herself, hurting others, other new concerning symptom.  For your behavioral health needs, you are advised to continue treatment with Leanord Hawking, MD, your current outpatient psychiatrist:       Leanord Hawking, MD      [facility]      [address] [place]      [place], [place] [address]      [phone]
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
## 2019-07-19 NOTE — ED Notes (Signed)
BAL 344 at 2320. TTS will attempt assessment at later time when patient is sober. Anderson Malta, Agricultural consultant, informed of status.
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
## 2019-07-19 NOTE — BH Assessment (Signed)
TTS waiting for cart to be placed with pt for assessment.

## 2019-07-19 NOTE — ED Notes (Signed)
Pt dressed out in purple scrubs, belongings secured in personal belongings bag, including cell phone- labeled with pt label, placed in pt belongings cupboard at nurses station.

## 2019-07-19 NOTE — ED Notes (Signed)
TTS speaking with pt via Tele machine.

## 2019-07-19 NOTE — BH Assessment (Signed)
[place] Assessment Progress Note  Per Shuvon Rankin, FNP, this pt does not require psychiatric hospitalization at this time.  Pt is psychiatrically cleared.  Discharge instructions advise pt to continue treatment with Leanord Hawking, MD, her current outpatient provider.  Pt would also benefit from seeing Peer Support Specialists, and a peer support consult has been ordered for pt.  Pt's nurse has been notified.  Jalene Mullet, [place] Triage Specialist [phone]

## 2019-07-19 NOTE — Patient Outreach (Signed)
CPSS met with the patient in the [facility] lobby in order to provide substance use recovery support and provide information for substance use recovery resources. Patient reports a history of alcohol use. EtOH test was positive. UDS was negative. Patient reports an interest in getting connected to an outpatient substance use treatment center. CPSS talked to the patient about [facility], [facility], and [facility] as options for outpatient dual diagnosis treatment. CPSS provided the patient with an outpatient substance use treatment list for follow up as well as other substance use recovery resource information. Some of this resource information includes residential/detox substance use treatment center list, [place] online/in-person AA meeting list, and CPSS contact information. CPSS strongly encouraged the patient to follow up with CPSS if needed to provide further help with getting connected to substance use recovery resources or further support with CPSS substance use recovery outreach services. Patient was pleasant, cooperative, and thanked CPSS for the [place] services provided.

## 2019-09-03 ENCOUNTER — Emergency Department (HOSPITAL_COMMUNITY)
Admission: EM | Admit: 2019-09-03 | Discharge: 2019-09-03 | Discharge status: Against Medical Advice | Payer: Self-pay | Attending: Emergency Medicine | Admitting: Emergency Medicine

## 2019-09-03 ENCOUNTER — Other Ambulatory Visit: Payer: Self-pay

## 2019-09-03 ENCOUNTER — Encounter (HOSPITAL_COMMUNITY): Payer: Self-pay | Admitting: Emergency Medicine

## 2019-09-03 DIAGNOSIS — Z87891 Personal history of nicotine dependence: Secondary | ICD-10-CM | POA: Insufficient documentation

## 2019-09-03 DIAGNOSIS — Z5320 Procedure and treatment not carried out because of patient's decision for unspecified reasons: Secondary | ICD-10-CM | POA: Insufficient documentation

## 2019-09-03 DIAGNOSIS — F10929 Alcohol use, unspecified with intoxication, unspecified: Secondary | ICD-10-CM | POA: Insufficient documentation

## 2019-09-03 DIAGNOSIS — Y908 Blood alcohol level of 240 mg/100 ml or more: Secondary | ICD-10-CM | POA: Insufficient documentation

## 2019-09-03 LAB — ETHANOL: Alcohol, Ethyl (B): 397 mg/dL (ref <10)

## 2019-09-03 LAB — COMPREHENSIVE METABOLIC PANEL
ALT: 23 U/L (ref 0–44)
AST: 29 U/L (ref 15–41)
Albumin: 4.6 g/dL (ref 3.5–5.0)
Alkaline Phosphatase: 82 U/L (ref 38–126)
Anion gap: 13 (ref 5–15)
BUN: 9 mg/dL (ref 6–20)
CO2: 26 mmol/L (ref 22–32)
Calcium: 8.9 mg/dL (ref 8.9–10.3)
Chloride: 103 mmol/L (ref 98–111)
Creatinine, Ser: 0.51 mg/dL (ref 0.44–1.00)
GFR calc Af Amer: >60 mL/min (ref >60)
GFR calc non Af Amer: >60 mL/min (ref >60)
Glucose, Bld: 96 mg/dL (ref 70–99)
Potassium: 4.3 mmol/L (ref 3.5–5.1)
Sodium: 142 mmol/L (ref 135–145)
Total Bilirubin: 0.5 mg/dL (ref 0.3–1.2)
Total Protein: 7.8 g/dL (ref 6.5–8.1)

## 2019-09-03 LAB — CBC WITH DIFFERENTIAL/PLATELET
Abs Immature Granulocytes: 0.01 10*3/uL (ref 0.00–0.07)
Basophils Absolute: 0.1 10*3/uL (ref 0.0–0.1)
Basophils Relative: 1 %
Eosinophils Absolute: 0.2 10*3/uL (ref 0.0–0.5)
Eosinophils Relative: 2 %
HCT: 43.6 % (ref 36.0–46.0)
Hemoglobin: 14.7 g/dL (ref 12.0–15.0)
Immature Granulocytes: 0 %
Lymphocytes Relative: 54 %
Lymphs Abs: 4.5 10*3/uL — ABNORMAL HIGH (ref 0.7–4.0)
MCH: 31.4 pg (ref 26.0–34.0)
MCHC: 33.7 g/dL (ref 30.0–36.0)
MCV: 93.2 fL (ref 80.0–100.0)
Monocytes Absolute: 0.4 10*3/uL (ref 0.1–1.0)
Monocytes Relative: 4 %
Neutro Abs: 3.2 10*3/uL (ref 1.7–7.7)
Neutrophils Relative %: 39 %
Platelets: 217 10*3/uL (ref 150–400)
RBC: 4.68 MIL/uL (ref 3.87–5.11)
RDW: 13.1 % (ref 11.5–15.5)
WBC: 8.2 10*3/uL (ref 4.0–10.5)
nRBC: 0 % (ref 0.0–0.2)

## 2019-09-03 LAB — RAPID URINE DRUG SCREEN, HOSP PERFORMED
Amphetamines: NOT DETECTED
Barbiturates: NOT DETECTED
Benzodiazepines: NOT DETECTED
Cocaine: NOT DETECTED
Opiates: NOT DETECTED
Tetrahydrocannabinol: NOT DETECTED

## 2019-09-03 LAB — I-STAT BETA HCG BLOOD, ED (MC, WL, AP ONLY): I-stat hCG, quantitative: <5 m[IU]/mL (ref <5)

## 2019-09-03 NOTE — ED Notes (Signed)
[employer] officers still at bedside with pt at this time.

## 2019-09-03 NOTE — ED Notes (Signed)
Pt ambulating out of department stating "I'm leaving". Pt had removed IV access to right AC. Dr.Bero notified that pt had left. No further orders given.

## 2019-09-03 NOTE — ED Notes (Signed)
Pt states to [employer] whom is at bedside asking her questions that she doesn't trust Hank and they are on different levels. Hank is pt's husband
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
## 2019-09-03 NOTE — ED Notes (Addendum)
Three [employer] officers at bedside interviewing patient. Will obtain blood work that is ordered once they are completed with their process.

## 2019-10-01 ENCOUNTER — Emergency Department (HOSPITAL_COMMUNITY)
Admission: EM | Admit: 2019-10-01 | Discharge: 2019-10-01 | Discharge status: Against Medical Advice | Payer: Self-pay | Attending: Emergency Medicine | Admitting: Emergency Medicine

## 2019-10-01 ENCOUNTER — Other Ambulatory Visit: Payer: Self-pay

## 2019-10-01 DIAGNOSIS — B1920 Unspecified viral hepatitis C without hepatic coma: Secondary | ICD-10-CM | POA: Insufficient documentation

## 2019-10-01 DIAGNOSIS — J45909 Unspecified asthma, uncomplicated: Secondary | ICD-10-CM | POA: Insufficient documentation

## 2019-10-01 DIAGNOSIS — Z87891 Personal history of nicotine dependence: Secondary | ICD-10-CM | POA: Insufficient documentation

## 2019-10-01 DIAGNOSIS — F10231 Alcohol dependence with withdrawal delirium: Secondary | ICD-10-CM | POA: Insufficient documentation

## 2019-10-01 DIAGNOSIS — F1020 Alcohol dependence, uncomplicated: Secondary | ICD-10-CM

## 2019-10-01 NOTE — ED Triage Notes (Signed)
Patient bib gems, patient presents to ED for detox from etoh. Per ems, patient was unresponsive at home. Patient's husband tried to wake patient but couldn't. Husband called 20 and patient gained consciousness en route. Patient admitted to EMS to having wine this morning. EMS states patient wants to be checked out here and then patient will go to alcohol rehab.

## 2019-10-01 NOTE — ED Notes (Signed)
Patient and belongings not found in room. MD made aware.

## 2019-10-01 NOTE — ED Provider Notes (Signed)
[facility] DEPT Provider Note   CSN: [id number] Arrival date & time: 10/01/19  1411     History Chief Complaint  Patient presents with  . Alcohol Problem    Sylvia Maxwell is a 34 y.o. female.  Patient with hx chronic etoh abuse, presents via EMS from home, s/p etoh use/abuse this AM. Per EMS, spouse indicates pt unresponsive earlier, but on their arrival patient was awake and alert. No seizure activity described. Patient denies specific physical c/o. No trauma or fall. No fever. No headache. No cp or sob. No abd pain or vomiting. Does indicate is considering going into rehab/treatment. Denies severe depression or any SI.   The history is provided by the patient.  Alcohol Problem Pertinent negatives include no chest pain, no abdominal pain, no headaches and no shortness of breath.       Past Medical History:  Diagnosis Date  . Alcoholism ([place])   . Bronchitis   . Depression   . Hepatitis    ? carrier  .01- HEP C - was told when she tried to give blood  . Pneumonia    walking pneumonia   aspiriation Pneumonia 05/2017  . Seizures ([place])    alcohol withdrawl    Patient Active Problem List   Diagnosis Date Noted  . Retained products of conception after miscarriage 04/11/2018  . Polysubstance (including opioids) dependence with physiol dependence ([place]) 05/22/2017  . Major depressive disorder, single episode, severe ([place]) 05/22/2017  . Heroin overdose ([place]) 05/20/2017  . Alcohol dependence with withdrawal with complication ([place]) 24/40/1027  . Alcohol withdrawal seizure ([place]) 06/13/2016  . Seizure ([place])   . ETOH abuse 06/12/2016  . Alcohol withdrawal seizure without complication ([place]) 25/36/6440    Past Surgical History:  Procedure Laterality Date  . BREAST ENHANCEMENT SURGERY  2008  . DILATION AND EVACUATION N/A 04/15/2018   Procedure: DILATATION AND EVACUATION;  Surgeon: Osborne Oman, MD;  Location: [facility];  Service: Gynecology;   Laterality: N/A;  . OPEN REDUCTION, INTERNAL FIXATION (ORIF) CALCANEAL FRACTURE WITH FUSION Left 09/27/2018   Procedure: OPEN REDUCTION, INTERNAL FIXATION (ORIF) CALCANEAL FRACTURE AND EXPLORE PERONEAL TENDONS;  Surgeon: Erle Crocker, MD;  Location: [facility];  Service: Orthopedics;  Laterality: Left;     OB History    Gravida  1   Para      Term      Preterm      AB      Living        SAB      TAB      Ectopic      Multiple      Live Births              Family History  Family history unknown: Yes    Social History   Tobacco Use  . Smoking status: Former Smoker    Years: 10.00    Types: Cigarettes    Quit date: 05/2017    Years since quitting: 2.3  . Smokeless tobacco: Never Used  Vaping Use  . Vaping Use: Never used  Substance Use Topics  . Alcohol use: Yes  . Drug use: Not Currently    Comment:  Meth, cocaine - 05/2017    Home Medications Prior to Admission medications   Medication Sig Start Date End Date Taking? Authorizing Provider  cetirizine (ZYRTEC) 10 MG tablet Take 10 mg by mouth daily.    [provider]  FLUoxetine (PROZAC) 40 MG capsule Take 40  mg by mouth daily. 04/20/19   [provider]  fluticasone (FLONASE) 50 MCG/ACT nasal spray Place 1 spray into both nostrils daily.    [provider]  gabapentin (NEURONTIN) 400 MG capsule Take 400 mg by mouth 3 (three) times daily.    [provider]  ibuprofen (ADVIL) 800 MG tablet Take 1 tablet (800 mg total) by mouth 3 (three) times daily. Patient not taking: Reported on 09/23/2018 08/26/18   Augusto Gamble B, NP  Multiple Vitamin (MULTIVITAMIN) tablet Take 1 tablet by mouth daily.    [provider]  vitamin B-12 (CYANOCOBALAMIN) 500 MCG tablet Take 500 mcg by mouth daily.    [provider]    Allergies    Lactose intolerance (gi)  Review of Systems   Review of Systems  Constitutional: Negative for fever.  HENT: Negative for sore  throat.   Eyes: Negative for redness.  Respiratory: Negative for cough and shortness of breath.   Cardiovascular: Negative for chest pain.  Gastrointestinal: Negative for abdominal pain and vomiting.  Genitourinary: Negative for flank pain.  Musculoskeletal: Negative for back pain and neck pain.  Skin: Negative for rash.  Neurological: Negative for headaches.  Hematological: Does not bruise/bleed easily.  Psychiatric/Behavioral: Negative for suicidal ideas.    Physical Exam Updated Vital Signs BP 113/87 (BP Location: Left Arm)   Pulse 100   Temp 99 F (37.2 C) (Oral)   Resp 16   Ht 1.676 m (5\' 6" )   Wt 59 kg   SpO2 98%   BMI 20.98 kg/m   Physical Exam Vitals and nursing note reviewed.  Constitutional:      Appearance: Normal appearance. She is well-developed.  HENT:     Head: Atraumatic.     Nose: Nose normal.     Mouth/Throat:     Mouth: Mucous membranes are moist.  Eyes:     General: No scleral icterus.    Conjunctiva/sclera: Conjunctivae normal.     Pupils: Pupils are equal, round, and reactive to light.  Neck:     Trachea: No tracheal deviation.  Cardiovascular:     Rate and Rhythm: Normal rate and regular rhythm.     Pulses: Normal pulses.     Heart sounds: Normal heart sounds. No murmur heard.  No friction rub. No gallop.   Pulmonary:     Effort: Pulmonary effort is normal. No respiratory distress.     Breath sounds: Normal breath sounds.  Abdominal:     General: Bowel sounds are normal. There is no distension.     Palpations: Abdomen is soft.     Tenderness: There is no abdominal tenderness.  Genitourinary:    Comments: No cva tenderness.  Musculoskeletal:        General: No swelling.     Cervical back: Normal range of motion and neck supple. No rigidity. No muscular tenderness.  Skin:    General: Skin is warm and dry.     Findings: No rash.  Neurological:     Mental Status: She is alert.     Comments: Alert, speech normal. Motor/sens grossly  intact bil.   Psychiatric:        Mood and Affect: Mood normal.     ED Results / Procedures / Treatments   Labs (all labs ordered are listed, but only abnormal results are displayed) Labs Reviewed  CBC  COMPREHENSIVE METABOLIC PANEL  ETHANOL  RAPID URINE DRUG SCREEN, HOSP PERFORMED    EKG None  Radiology No results found.  Procedures Procedures (including critical care time)  Medications Ordered in ED Medications - No data to display  ED Course  I have reviewed the triage vital signs and the nursing notes.  Pertinent labs & imaging results that were available during my care of the patient were reviewed by me and considered in my medical decision making (see chart for details).    MDM Rules/Calculators/A&P                          Labs sent. Po fluids/food.   Reviewed nursing notes and prior charts for additional history.   I went to reassess patient, and patient had left ED AMA prior to completion of ED evaluation and treatment, without notifying medical team.      Final Clinical Impression(s) / ED Diagnoses Final diagnoses:  None    Rx / DC Orders ED Discharge Orders    None       Lajean Saver, MD 10/01/19 1654

## 2019-10-01 NOTE — ED Notes (Signed)
Patient and belongings not found in room. 

## 2020-09-01 IMAGING — CT CT OF THE LEFT FOOT WITHOUT CONTRAST
3 series · 15 of 28 positions shown, 17 images · non-contrast
Comparison: Radiographs 08/26/2018

CLINICAL DATA: Evaluate calcaneus fracture.

EXAM:
CT OF THE LEFT FOOT WITHOUT CONTRAST
TECHNIQUE: Multidetector CT imaging of the left foot was performed according to
the standard protocol. Multiplanar CT image reconstructions were
also generated.

[Series 4: soft tissue lower extremity · axial · 0.52mm/px · z∈[-247,-87]mm · 6 of 105 slices shown, 8 images]
[im 17/105  soft-tissue]
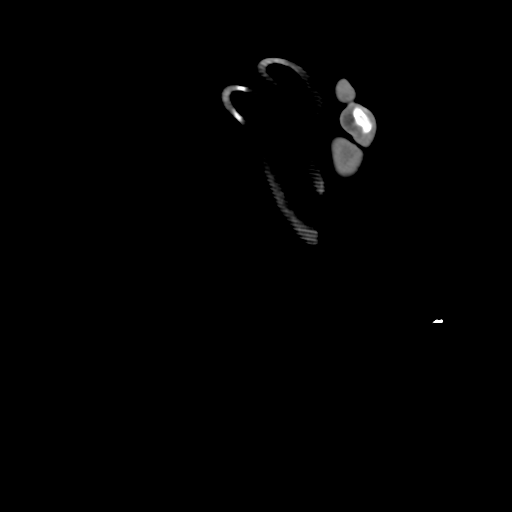
[im 17/105  bone]
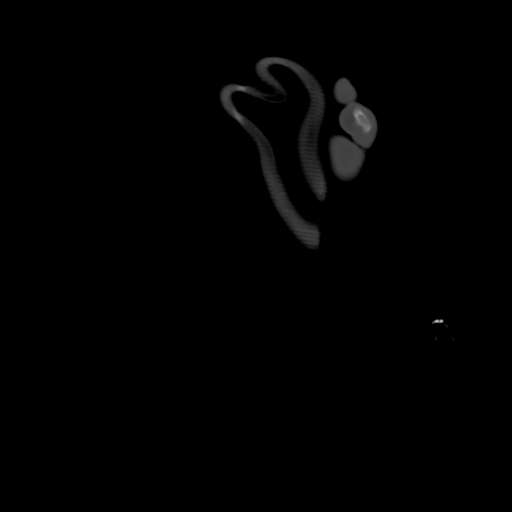
[im 33/105  bone]
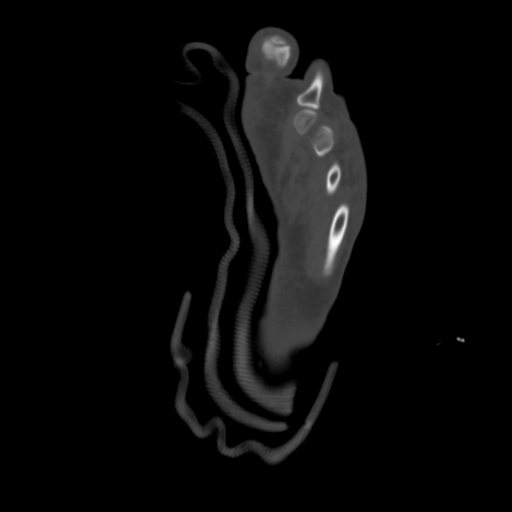
[im 49/105  bone]
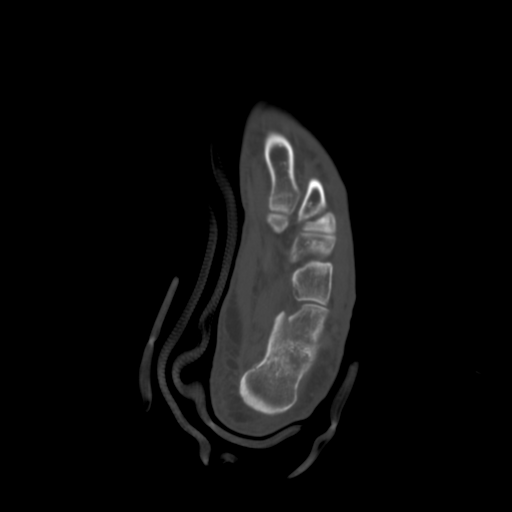
[im 65/105  bone]
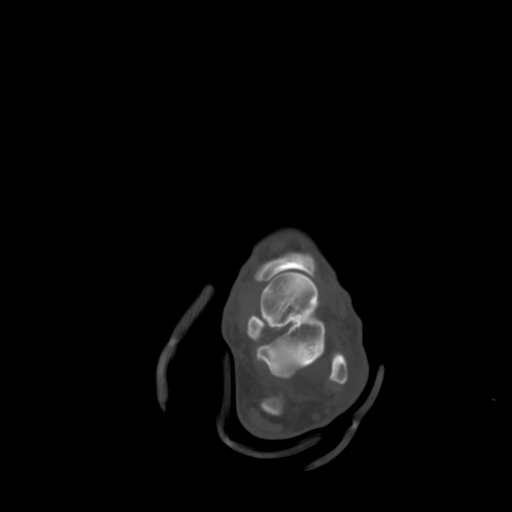
[im 81/105  soft-tissue]
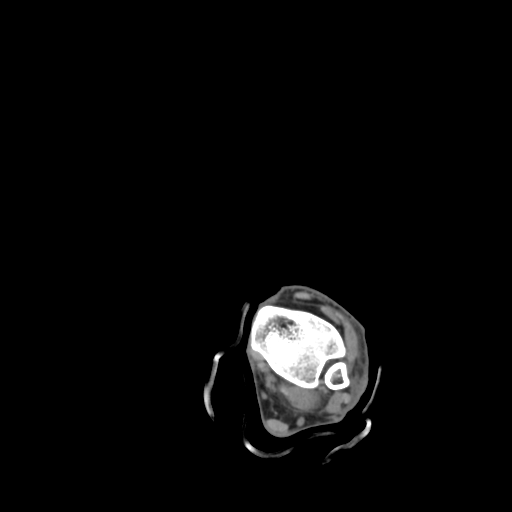
[im 81/105  bone]
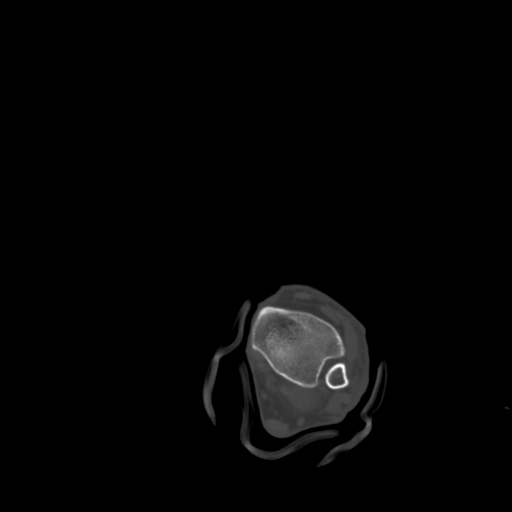
[im 97/105  bone]
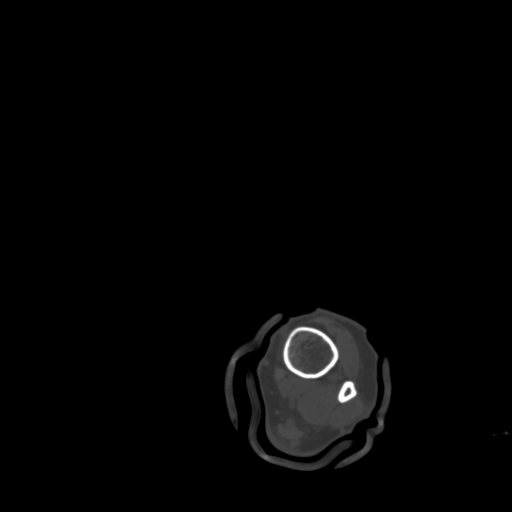

[Series 9: cor soft tissue · coronal · 0.30mm/px · 3 of 138 slices shown]
[im 55/138  bone]
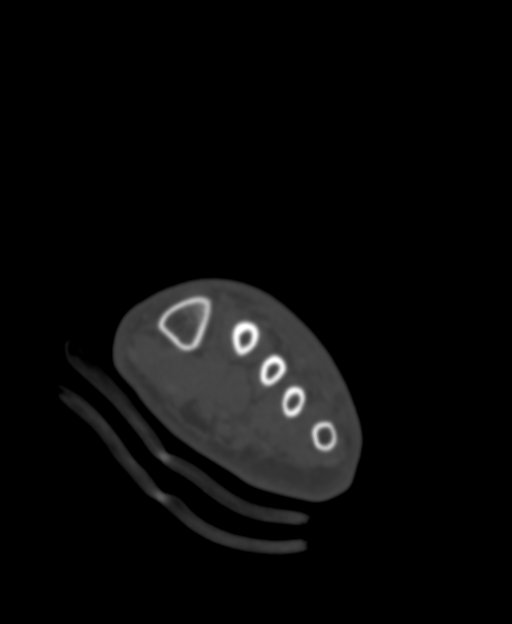
[im 73/138  bone]
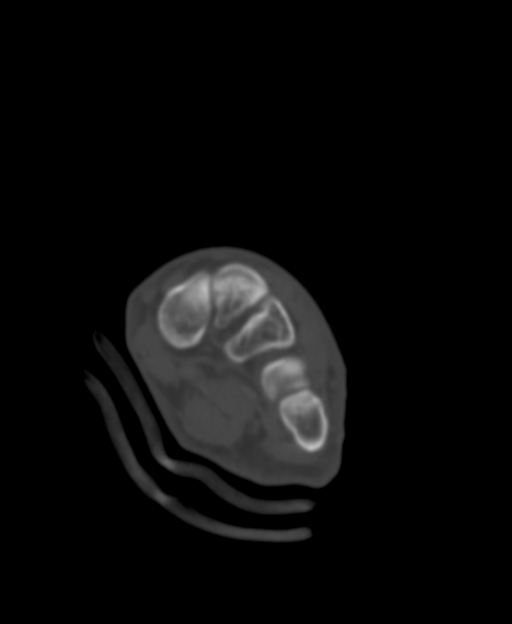
[im 92/138  bone]
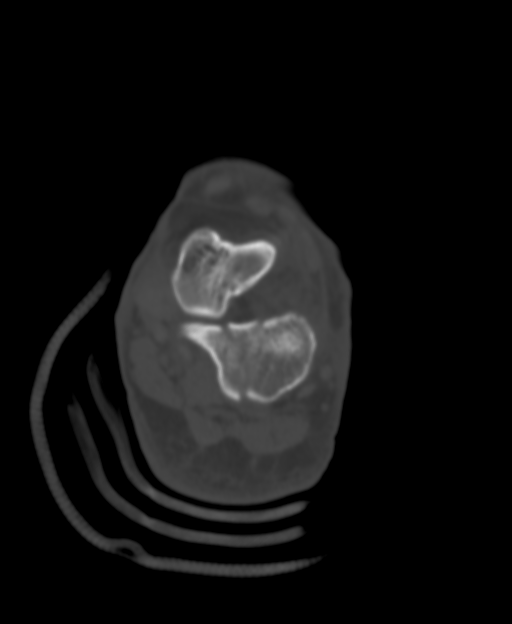

[Series 10: sagsoft tissue · sagittal · 0.41mm/px · 6 of 55 slices shown]
[im 28/55  soft-tissue]
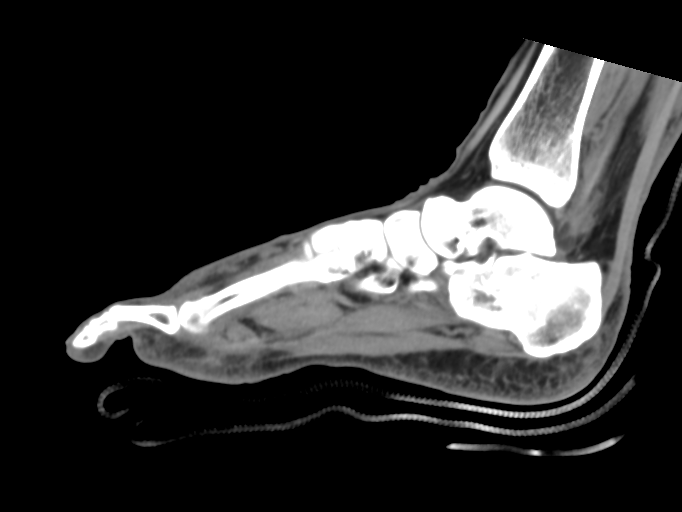
[im 50/55  bone]
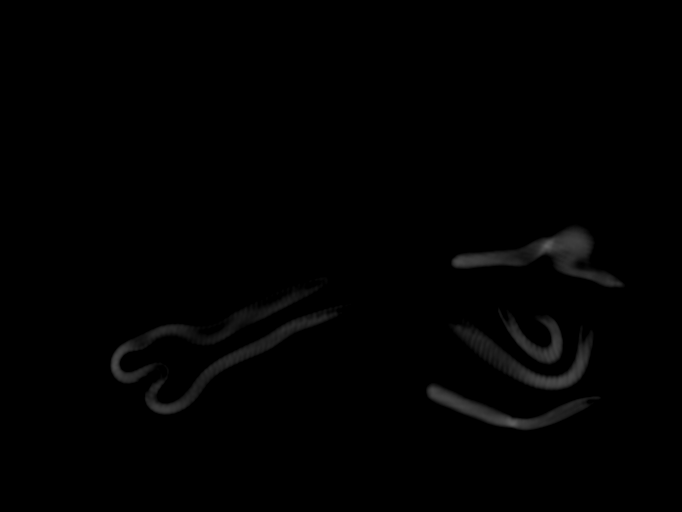
[im 51/55  bone]
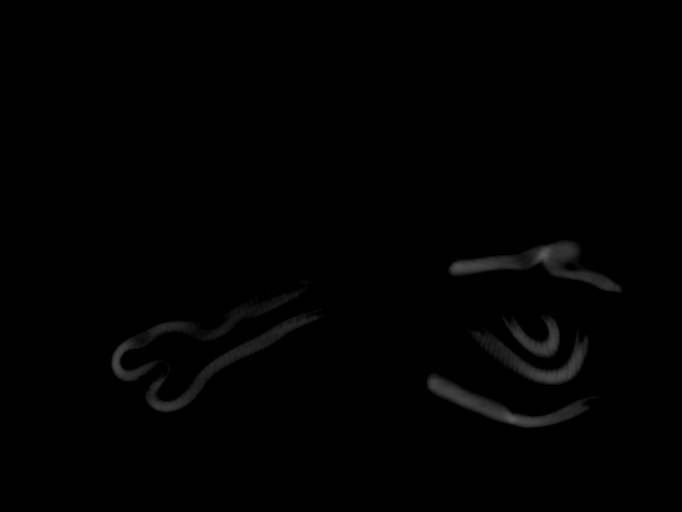
[im 52/55  bone]
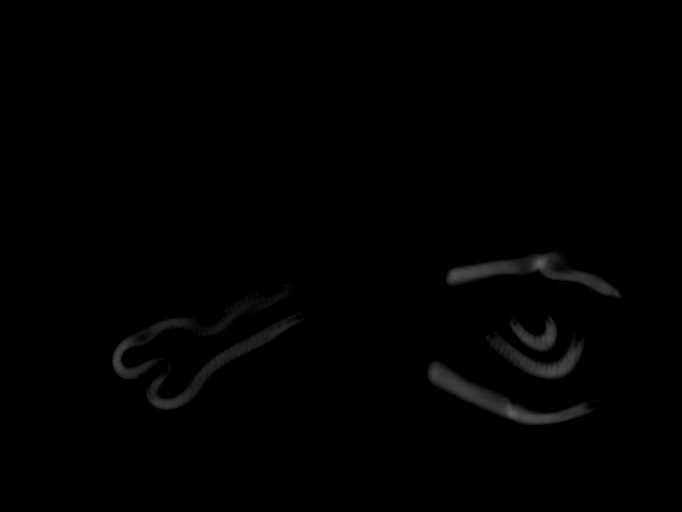
[im 53/55  bone]
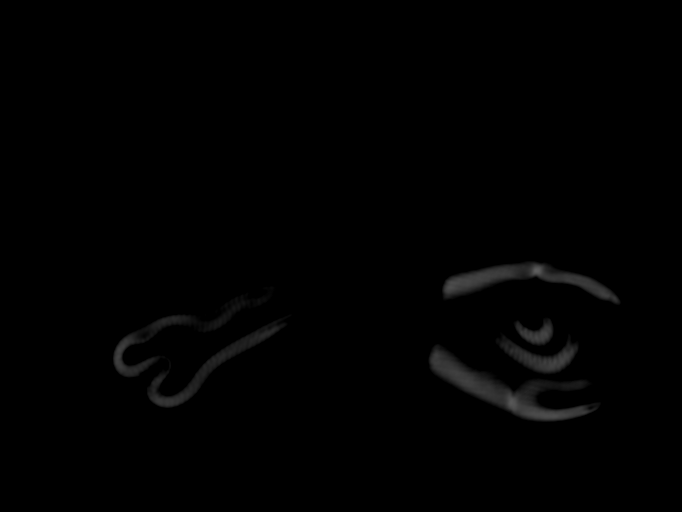
[im 54/55  bone]
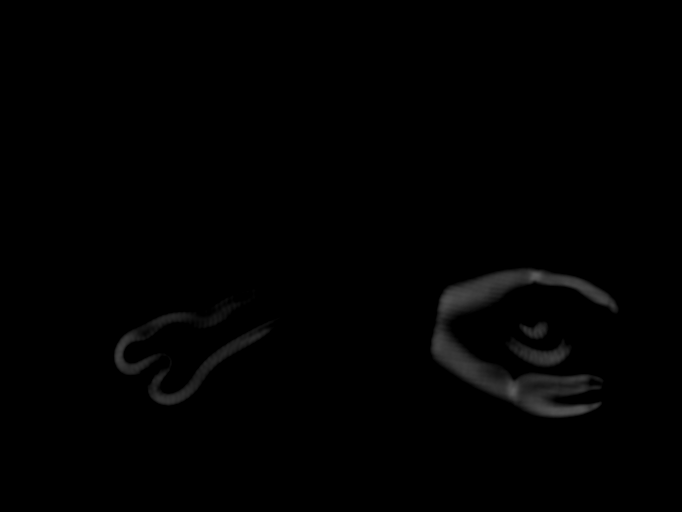

[15 of 28 positions shown; findings below may reference images not displayed]

FINDINGS: There is a complex comminuted largely central depression type
calcaneus fracture. There is significant depression and plantar
rotation of the posterior calcaneal facet. There is also a mildly
displaced fracture through the base of the middle
facet/sustentacular limb. Nondisplaced fracture through the base of
the anterior process of the calcaneus.

The tibiotalar joint is maintained. No talar fractures. The
navicular bone and cuneiforms are intact. No cuboid fracture. The
metatarsals are intact.

Associated significant subcutaneous soft tissue
swelling/edema/fluid/hemorrhage.
IMPRESSION: 1. Complex comminuted central depression type calcaneal fracture.
2. The other bony structures of the foot and ankle are intact.

## 2021-01-18 ENCOUNTER — Other Ambulatory Visit: Payer: Self-pay

## 2021-01-18 ENCOUNTER — Ambulatory Visit
Admission: EM | Admit: 2021-01-18 | Discharge: 2021-01-18 | Disposition: A | Discharge status: Home / Self Care | Payer: Self-pay | Attending: Internal Medicine | Admitting: Internal Medicine

## 2021-01-18 DIAGNOSIS — J018 Other acute sinusitis: Secondary | ICD-10-CM

## 2021-01-18 DIAGNOSIS — J029 Acute pharyngitis, unspecified: Secondary | ICD-10-CM

## 2021-01-18 DIAGNOSIS — R051 Acute cough: Secondary | ICD-10-CM | POA: Diagnosis not present

## 2021-01-18 DIAGNOSIS — Z20822 Contact with and (suspected) exposure to covid-19: Secondary | ICD-10-CM

## 2021-01-18 MED ORDER — AMOXICILLIN 875 MG PO TABS: 875.0000 mg | ORAL_TABLET | Freq: Two times a day (BID) | ORAL | 0 refills | Status: DC

## 2021-01-18 NOTE — ED Provider Notes (Signed)
[facility]    CSN: [id number] Arrival date & time: 01/18/21  0857      History   Chief Complaint Chief Complaint  Patient presents with   Cough    HPI Sylvia Maxwell is a 35 y.o. female.   Patient presents with 1 week history of nasal congestion, facial pressure and pain, body aches, productive cough.  Cough is productive with yellow sputum per patient.  Denies any known fevers.  No chest pain or shortness of breath.  Has taken Mucinex and Alka-Seltzer cold and flu with minimal improvement in symptoms.  Denies nausea, vomiting, abdominal pain, diarrhea.   Cough  Past Medical History:  Diagnosis Date   Alcoholism ([place])    Bronchitis    Depression    Hepatitis    ? carrier  .01- HEP C - was told when she tried to give blood   Pneumonia    walking pneumonia   aspiriation Pneumonia 05/2017   Seizures ([place])    alcohol withdrawl    Patient Active Problem List   Diagnosis Date Noted   Retained products of conception after miscarriage 04/11/2018   Polysubstance (including opioids) dependence with physiol dependence ([place]) 05/22/2017   Major depressive disorder, single episode, severe ([place]) 05/22/2017   Heroin overdose ([place]) 05/20/2017   Alcohol dependence with withdrawal with complication ([place]) 77/82/4235   Alcohol withdrawal seizure ([place]) 06/13/2016   Seizure ([place])    ETOH abuse 06/12/2016   Alcohol withdrawal seizure without complication ([place]) 36/14/4315    Past Surgical History:  Procedure Laterality Date   BREAST ENHANCEMENT SURGERY  2008   DILATION AND EVACUATION N/A 04/15/2018   Procedure: DILATATION AND EVACUATION;  Surgeon: Osborne Oman, MD;  Location: [facility];  Service: Gynecology;  Laterality: N/A;   OPEN REDUCTION, INTERNAL FIXATION (ORIF) CALCANEAL FRACTURE WITH FUSION Left 09/27/2018   Procedure: OPEN REDUCTION, INTERNAL FIXATION (ORIF) CALCANEAL FRACTURE AND EXPLORE PERONEAL TENDONS;  Surgeon: Erle Crocker, MD;  Location: [facility];   Service: Orthopedics;  Laterality: Left;    OB History     Gravida  1   Para      Term      Preterm      AB      Living         SAB      IAB      Ectopic      Multiple      Live Births               Home Medications    Prior to Admission medications   Medication Sig Start Date End Date Taking? Authorizing Provider  amoxicillin (AMOXIL) 875 MG tablet Take 1 tablet (875 mg total) by mouth 2 (two) times daily for 7 days. 01/18/21 01/25/21 Yes Odis Luster, FNP  cetirizine (ZYRTEC) 10 MG tablet Take 10 mg by mouth daily.    [provider]  FLUoxetine (PROZAC) 40 MG capsule Take 40 mg by mouth daily. 04/20/19   [provider]  fluticasone (FLONASE) 50 MCG/ACT nasal spray Place 1 spray into both nostrils daily.    [provider]  gabapentin (NEURONTIN) 400 MG capsule Take 400 mg by mouth 3 (three) times daily.    [provider]  ibuprofen (ADVIL) 800 MG tablet Take 1 tablet (800 mg total) by mouth 3 (three) times daily. Patient not taking: Reported on 09/23/2018 08/26/18   Augusto Gamble B, NP  Multiple Vitamin (MULTIVITAMIN) tablet Take 1 tablet by mouth  daily.    [provider]  vitamin B-12 (CYANOCOBALAMIN) 500 MCG tablet Take 500 mcg by mouth daily.    [provider]    Family History Family History  Family history unknown: Yes    Social History Social History   Tobacco Use   Smoking status: Former    Years: 10.00    Types: Cigarettes    Quit date: 05/2017    Years since quitting: 3.6   Smokeless tobacco: Never  Vaping Use   Vaping Use: Never used  Substance Use Topics   Alcohol use: Yes   Drug use: Not Currently    Comment:  Meth, cocaine - 05/2017     Allergies   Lactose intolerance (gi)   Review of Systems Review of Systems Per HPI  Physical Exam Triage Vital Signs ED Triage Vitals [01/18/21 0925]  Enc Vitals Group     BP 121/84     Pulse Rate 96     Resp 16     Temp 98.4  F (36.9 C)     Temp Source Oral     SpO2 95 %     Weight      Height      Head Circumference      Peak Flow      Pain Score      Pain Loc      Pain Edu?      Excl. in [place]?    No data found.  Updated Vital Signs BP 121/84 (BP Location: Left Arm)   Pulse 96   Temp 98.4 F (36.9 C) (Oral)   Resp 16   LMP 12/28/2020   SpO2 95%   Visual Acuity Right Eye Distance:   Left Eye Distance:   Bilateral Distance:    Right Eye Near:   Left Eye Near:    Bilateral Near:     Physical Exam Constitutional:      General: She is not in acute distress.    Appearance: Normal appearance. She is not toxic-appearing or diaphoretic.  HENT:     Head: Normocephalic and atraumatic.     Right Ear: Tympanic membrane and ear canal normal.     Left Ear: Tympanic membrane and ear canal normal.     Nose: Congestion present.     Right Sinus: Maxillary sinus tenderness and frontal sinus tenderness present.     Left Sinus: Maxillary sinus tenderness and frontal sinus tenderness present.     Mouth/Throat:     Mouth: Mucous membranes are moist.     Pharynx: Posterior oropharyngeal erythema present.  Eyes:     Extraocular Movements: Extraocular movements intact.     Conjunctiva/sclera: Conjunctivae normal.     Pupils: Pupils are equal, round, and reactive to light.  Cardiovascular:     Rate and Rhythm: Normal rate and regular rhythm.     Pulses: Normal pulses.     Heart sounds: Normal heart sounds.  Pulmonary:     Effort: Pulmonary effort is normal. No respiratory distress.     Breath sounds: Normal breath sounds. No wheezing.  Abdominal:     General: Abdomen is flat. Bowel sounds are normal.     Palpations: Abdomen is soft.  Musculoskeletal:        General: Normal range of motion.     Cervical back: Normal range of motion.  Skin:    General: Skin is warm and dry.  Neurological:     General: No focal deficit present.  Mental Status: She is alert and oriented to person, place, and time.  Mental status is at baseline.  Psychiatric:        Mood and Affect: Mood normal.        Behavior: Behavior normal.     UC Treatments / Results  Labs (all labs ordered are listed, but only abnormal results are displayed) Labs Reviewed  CULTURE, GROUP A STREP ([place])  NOVEL CORONAVIRUS, NAA  POCT RAPID STREP A (OFFICE)    EKG   Radiology No results found.  Procedures Procedures (including critical care time)  Medications Ordered in UC Medications - No data to display  Initial Impression / Assessment and Plan / UC Course  I have reviewed the triage vital signs and the nursing notes.  Pertinent labs & imaging results that were available during my care of the patient were reviewed by me and considered in my medical decision making (see chart for details).     Will treat acute sinusitis with amoxicillin.  Rapid strep test was negative.  Throat culture and COVID-19 viral swab are pending.  Discussed over-the-counter medications to help alleviate patient's symptoms such as nasal decongestants and cough medications with patient.Discussed strict return precautions. Patient verbalized understanding and is agreeable with plan.  Final Clinical Impressions(s) / UC Diagnoses   Final diagnoses:  Acute non-recurrent sinusitis of other sinus  Acute cough  Sore throat  Encounter for laboratory testing for COVID-19 virus     Discharge Instructions      You have been prescribed amoxicillin antibiotic to treat upper respiratory infection.  Rapid strep test was negative.  Throat culture and COVID-19 viral swab are pending.  We will call if they are positive.     ED Prescriptions     Medication Sig Dispense Auth. Provider   amoxicillin (AMOXIL) 875 MG tablet Take 1 tablet (875 mg total) by mouth 2 (two) times daily for 7 days. 14 tablet Odis Luster, FNP      PDMP not reviewed this encounter.   Odis Luster, FNP 01/18/21 1021

## 2021-01-18 NOTE — ED Triage Notes (Signed)
Symptoms started about a week ago. Taken mucinex last night, alka seltzer cold medication this morning not much improvement. Has body aches, sinus pressure.

## 2021-01-18 NOTE — Discharge Instructions (Addendum)
You have been prescribed amoxicillin antibiotic to treat upper respiratory infection.  Rapid strep test was negative.  Throat culture and COVID-19 viral swab are pending.  We will call if they are positive.

## 2021-01-19 LAB — SARS-COV-2, NAA 2 DAY TAT

## 2021-01-19 LAB — NOVEL CORONAVIRUS, NAA: SARS-CoV-2, NAA: NOT DETECTED

## 2021-01-21 ENCOUNTER — Ambulatory Visit
Admission: EM | Admit: 2021-01-21 | Discharge: 2021-01-21 | Disposition: A | Discharge status: Home / Self Care | Payer: Self-pay | Attending: Physician Assistant | Admitting: Physician Assistant

## 2021-01-21 DIAGNOSIS — J019 Acute sinusitis, unspecified: Secondary | ICD-10-CM | POA: Diagnosis not present

## 2021-01-21 LAB — CULTURE, GROUP A STREP (THRC)

## 2021-01-21 MED ORDER — AMOXICILLIN-POT CLAVULANATE 875-125 MG PO TABS: 1.0000 | ORAL_TABLET | Freq: Two times a day (BID) | ORAL | 0 refills | Status: DC

## 2021-01-21 NOTE — ED Provider Notes (Signed)
Does wear [facility]    CSN: [id number] Arrival date & time: 01/21/21  1420      History   Chief Complaint Chief Complaint  Patient presents with   Otalgia    right   Cough    HPI Sylvia Maxwell is a 35 y.o. female.   Patient here today for evaluation of continued sinus congestion, cough, and ear pain.  She states that antibiotic she was recently prescribed has not been very helpful.  She has not had fever.  She denies any nausea, vomiting, diarrhea.  The history is provided by the patient.  Otalgia Associated symptoms: congestion and cough   Associated symptoms: no abdominal pain, no diarrhea, no fever, no sore throat and no vomiting   Cough Associated symptoms: ear pain   Associated symptoms: no chills, no eye discharge, no fever, no shortness of breath, no sore throat and no wheezing    Past Medical History:  Diagnosis Date   Alcoholism ([place])    Bronchitis    Depression    Hepatitis    ? carrier  .01- HEP C - was told when she tried to give blood   Pneumonia    walking pneumonia   aspiriation Pneumonia 05/2017   Seizures ([place])    alcohol withdrawl    Patient Active Problem List   Diagnosis Date Noted   Retained products of conception after miscarriage 04/11/2018   Polysubstance (including opioids) dependence with physiol dependence ([place]) 05/22/2017   Major depressive disorder, single episode, severe ([place]) 05/22/2017   Heroin overdose ([place]) 05/20/2017   Alcohol dependence with withdrawal with complication ([place]) 67/67/2094   Alcohol withdrawal seizure ([place]) 06/13/2016   Seizure ([place])    ETOH abuse 06/12/2016   Alcohol withdrawal seizure without complication ([place]) 70/96/2836    Past Surgical History:  Procedure Laterality Date   BREAST ENHANCEMENT SURGERY  2008   DILATION AND EVACUATION N/A 04/15/2018   Procedure: DILATATION AND EVACUATION;  Surgeon: Osborne Oman, MD;  Location: [facility];  Service: Gynecology;  Laterality: N/A;   OPEN  REDUCTION, INTERNAL FIXATION (ORIF) CALCANEAL FRACTURE WITH FUSION Left 09/27/2018   Procedure: OPEN REDUCTION, INTERNAL FIXATION (ORIF) CALCANEAL FRACTURE AND EXPLORE PERONEAL TENDONS;  Surgeon: Erle Crocker, MD;  Location: [facility];  Service: Orthopedics;  Laterality: Left;    OB History     Gravida  1   Para      Term      Preterm      AB      Living         SAB      IAB      Ectopic      Multiple      Live Births               Home Medications    Prior to Admission medications   Medication Sig Start Date End Date Taking? Authorizing Provider  amoxicillin-clavulanate (AUGMENTIN) 875-125 MG tablet Take 1 tablet by mouth every 12 (twelve) hours. 01/21/21  Yes Francene Finders, PA-C  cetirizine (ZYRTEC) 10 MG tablet Take 10 mg by mouth daily.    [provider]  FLUoxetine (PROZAC) 40 MG capsule Take 40 mg by mouth daily. 04/20/19   [provider]  fluticasone (FLONASE) 50 MCG/ACT nasal spray Place 1 spray into both nostrils daily.    [provider]  gabapentin (NEURONTIN) 400 MG capsule Take 400 mg by mouth 3 (three) times daily.    [provider]  ibuprofen (ADVIL) 800 MG tablet Take 1 tablet (800 mg total) by mouth 3 (three) times daily. Patient not taking: Reported on 09/23/2018 08/26/18   Augusto Gamble B, NP  Multiple Vitamin (MULTIVITAMIN) tablet Take 1 tablet by mouth daily.    [provider]  vitamin B-12 (CYANOCOBALAMIN) 500 MCG tablet Take 500 mcg by mouth daily.    [provider]    Family History Family History  Family history unknown: Yes    Social History Social History   Tobacco Use   Smoking status: Former    Years: 10.00    Types: Cigarettes    Quit date: 05/2017    Years since quitting: 3.6   Smokeless tobacco: Never  Vaping Use   Vaping Use: Never used  Substance Use Topics   Alcohol use: Yes   Drug use: Not Currently    Comment:  Meth, cocaine - 05/2017      Allergies   Lactose intolerance (gi)   Review of Systems Review of Systems  Constitutional:  Negative for chills and fever.  HENT:  Positive for congestion, ear pain and sinus pressure. Negative for sore throat.   Eyes:  Negative for discharge and redness.  Respiratory:  Positive for cough. Negative for shortness of breath and wheezing.   Gastrointestinal:  Negative for abdominal pain, diarrhea, nausea and vomiting.    Physical Exam Triage Vital Signs ED Triage Vitals  Enc Vitals Group     BP 01/21/21 1434 99/65     Pulse Rate 01/21/21 1434 (!) 113     Resp 01/21/21 1434 18     Temp 01/21/21 1434 98.4 F (36.9 C)     Temp Source 01/21/21 1434 Oral     SpO2 01/21/21 1434 94 %     Weight --      Height --      Head Circumference --      Peak Flow --      Pain Score 01/21/21 1437 10     Pain Loc --      Pain Edu? --      Excl. in [place]? --    No data found.  Updated Vital Signs BP 99/65 (BP Location: Left Arm)   Pulse (!) 113   Temp 98.4 F (36.9 C) (Oral)   Resp 18   LMP 12/28/2020   SpO2 94%   Breastfeeding No     Physical Exam Vitals and nursing note reviewed.  Constitutional:      General: She is not in acute distress.    Appearance: Normal appearance. She is not ill-appearing.  HENT:     Head: Normocephalic and atraumatic.     Right Ear: Tympanic membrane normal.     Left Ear: Tympanic membrane normal.     Nose: Congestion present.     Mouth/Throat:     Mouth: Mucous membranes are moist.     Pharynx: No oropharyngeal exudate or posterior oropharyngeal erythema.  Eyes:     Conjunctiva/sclera: Conjunctivae normal.  Cardiovascular:     Rate and Rhythm: Normal rate and regular rhythm.     Heart sounds: Normal heart sounds. No murmur heard. Pulmonary:     Effort: Pulmonary effort is normal. No respiratory distress.     Breath sounds: Normal breath sounds. No wheezing, rhonchi or rales.  Skin:    General: Skin is warm and dry.  Neurological:      Mental Status: She is alert.  Psychiatric:  Mood and Affect: Mood normal.        Thought Content: Thought content normal.     UC Treatments / Results  Labs (all labs ordered are listed, but only abnormal results are displayed) Labs Reviewed - No data to display  EKG   Radiology No results found.  Procedures Procedures (including critical care time)  Medications Ordered in UC Medications - No data to display  Initial Impression / Assessment and Plan / UC Course  I have reviewed the triage vital signs and the nursing notes.  Pertinent labs & imaging results that were available during my care of the patient were reviewed by me and considered in my medical decision making (see chart for details).  Augmentin sent in to replace amoxicillin prescription.  Recommended follow-up if no improvement in any way.  Encouraged her to continue Flonase for ear discomfort.  Final Clinical Impressions(s) / UC Diagnoses   Final diagnoses:  Acute sinusitis, recurrence not specified, unspecified location     Discharge Instructions      Take antibiotic as prescribed. Continue to use flonase daily. Follow up if no improvement over the next week.      ED Prescriptions     Medication Sig Dispense Auth. Provider   amoxicillin-clavulanate (AUGMENTIN) 875-125 MG tablet Take 1 tablet by mouth every 12 (twelve) hours. 14 tablet Francene Finders, PA-C      PDMP not reviewed this encounter.   Francene Finders, PA-C 01/21/21 [phone]

## 2021-01-21 NOTE — ED Triage Notes (Signed)
Over one week h/o uri sxs. Pt was last seen on 10/8. Today, she complains that cough and congestion have worsened since the last visit. She complains of clogged right ear sensation. Onset this morning of generalized body aches. Currently taking amoxicillin without a decrease in sxs.

## 2021-01-21 NOTE — Discharge Instructions (Addendum)
Take antibiotic as prescribed. Continue to use flonase daily. Follow up if no improvement over the next week.

## 2021-03-04 ENCOUNTER — Encounter: Payer: Self-pay | Admitting: Nurse Practitioner

## 2021-03-04 ENCOUNTER — Ambulatory Visit (INDEPENDENT_AMBULATORY_CARE_PROVIDER_SITE_OTHER): Payer: 59 | Admitting: Nurse Practitioner

## 2021-03-04 ENCOUNTER — Other Ambulatory Visit: Payer: Self-pay

## 2021-03-04 VITALS — BP 114/78 | HR 77 | Temp 98.2°F | Ht 66.0 in | Wt 191.1 lb

## 2021-03-04 DIAGNOSIS — Z6830 Body mass index (BMI) 30.0-30.9, adult: Secondary | ICD-10-CM | POA: Diagnosis not present

## 2021-03-04 DIAGNOSIS — Z7689 Persons encountering health services in other specified circumstances: Secondary | ICD-10-CM | POA: Diagnosis not present

## 2021-03-04 DIAGNOSIS — D485 Neoplasm of uncertain behavior of skin: Secondary | ICD-10-CM | POA: Diagnosis not present

## 2021-03-04 DIAGNOSIS — F322 Major depressive disorder, single episode, severe without psychotic features: Secondary | ICD-10-CM

## 2021-03-04 NOTE — Progress Notes (Signed)
New Patient Office Visit  Subjective:  Patient ID: Sylvia Maxwell, female    DOB: [birth year]  Age: 35 y.o. MRN: [id number]  CC:  Chief Complaint  Patient presents with   New Patient (Initial Visit)     HPI Sylvia Maxwell presents to establish new primary care provider. Has been some time since she had routine care. She did have miscarriage about three years ago. She did see OB/GYN at that time, but has not been back since. She does need to have routine physical with a pap smear and routine fasting labs. States that she sometimes drinks too much. She states that every time she does drink, the next day, she will throw up. Usually one or two episodes, then she is OK furing the day. She states that she drinks intermittently. She was drinking this past Saturday. Before that, the last time she drank was over a week. She states that she has had week long "benders." Has gone to rehab for alcohol dependence. She does go to therapy once weekly. She states that this does help her a great deal. She goes to [employer] in downtown [place]. She does have a history of being exposed to hepatitis C. She did have two exposures with hepatitis C.  . Concern for ADD - trying to take real estate exam and having trouble concentrating. Has had to take one exam eight times and has not passed. Costs $150 every time she takes the test  2. Mole on right side of her face - getting larger. It is just in front of the ear. It is oval in shape and seems to be getting bigger.   Past Medical History:  Diagnosis Date   Alcoholism ([place])    Bronchitis    Depression    Hepatitis    ? carrier  .01- HEP C - was told when she tried to give blood   Pneumonia    walking pneumonia   aspiriation Pneumonia 05/2017   Seizures ([place])    alcohol withdrawl    Past Surgical History:  Procedure Laterality Date   BREAST ENHANCEMENT SURGERY  2008   DILATION AND EVACUATION N/A 04/15/2018   Procedure: DILATATION AND EVACUATION;  Surgeon:  Osborne Oman, MD;  Location: [facility];  Service: Gynecology;  Laterality: N/A;   OPEN REDUCTION, INTERNAL FIXATION (ORIF) CALCANEAL FRACTURE WITH FUSION Left 09/27/2018   Procedure: OPEN REDUCTION, INTERNAL FIXATION (ORIF) CALCANEAL FRACTURE AND EXPLORE PERONEAL TENDONS;  Surgeon: Erle Crocker, MD;  Location: [facility];  Service: Orthopedics;  Laterality: Left;    Family History  Problem Relation Age of Onset   Alcoholism Other    Diabetes Other     Social History   Socioeconomic History   Marital status: Married    Spouse name: Not on file   Number of children: Not on file   Years of education: Not on file   Highest education level: Not on file  Occupational History   Not on file  Tobacco Use   Smoking status: Former    Years: 10.00    Types: Cigarettes    Quit date: 05/2017    Years since quitting: 3.8   Smokeless tobacco: Never  Vaping Use   Vaping Use: Never used  Substance and Sexual Activity   Alcohol use: Yes   Drug use: Not Currently    Comment:  Meth, cocaine - 05/2017   Sexual activity: Yes  Other Topics Concern   Not on file  Social History Narrative  Not on file   Social Determinants of Health   Financial Resource Strain: Not on file  Food Insecurity: Not on file  Transportation Needs: Not on file  Physical Activity: Not on file  Stress: Not on file  Social Connections: Not on file  Intimate Partner Violence: Not on file    ROS Review of Systems  Constitutional:  Negative for activity change, appetite change, chills, fatigue and fever.  HENT:  Negative for congestion, postnasal drip, rhinorrhea, sinus pressure, sinus pain, sneezing and sore throat.   Eyes: Negative.   Respiratory:  Negative for cough, chest tightness, shortness of breath and wheezing.   Cardiovascular:  Negative for chest pain and palpitations.  Gastrointestinal:  Negative for abdominal pain, constipation, diarrhea, nausea and vomiting.  Endocrine: Negative for cold  intolerance, heat intolerance, polydipsia and polyuria.  Genitourinary:  Negative for dyspareunia, dysuria, flank pain, frequency and urgency.  Musculoskeletal:  Negative for arthralgias, back pain and myalgias.  Skin:  Negative for rash.       Presence of new mole just in front of the right ear lobe. States that it is gradually getting larger.   Allergic/Immunologic: Negative for environmental allergies.  Neurological:  Negative for dizziness, weakness and headaches.  Hematological:  Negative for adenopathy.  Psychiatric/Behavioral:  Positive for decreased concentration and dysphoric mood. The patient is nervous/anxious.    Objective:   Today's Vitals   03/04/21 1040  BP: 114/78  Pulse: 77  Temp: 98.2 F (36.8 C)  SpO2: 99%  Weight: 191 lb 1.9 oz (86.7 kg)  Height: 5\' 6"  (1.676 m)   Body mass index is 30.85 kg/m.   Physical Exam Vitals and nursing note reviewed.  Constitutional:      Appearance: Normal appearance. She is well-developed.  HENT:     Head: Normocephalic and atraumatic.     Nose: Nose normal.     Mouth/Throat:     Mouth: Mucous membranes are moist.     Pharynx: Oropharynx is clear.  Eyes:     Pupils: Pupils are equal, round, and reactive to light.  Cardiovascular:     Rate and Rhythm: Normal rate and regular rhythm.     Pulses: Normal pulses.     Heart sounds: Normal heart sounds.  Pulmonary:     Effort: Pulmonary effort is normal.     Breath sounds: Normal breath sounds.  Abdominal:     Palpations: Abdomen is soft.  Musculoskeletal:        General: Normal range of motion.     Cervical back: Normal range of motion and neck supple.  Lymphadenopathy:     Cervical: No cervical adenopathy.  Skin:    General: Skin is warm and dry.     Capillary Refill: Capillary refill takes less than 2 seconds.     Comments: There is a mole type lesions jut proximal of the right tragus. It is approximately 81mm in diameter with smooth borders. It is non-tender. No  evidence on infection or inflammation.   Neurological:     General: No focal deficit present.     Mental Status: She is alert and oriented to person, place, and time.  Psychiatric:        Attention and Perception: Attention and perception normal.        Mood and Affect: Affect normal. Mood is anxious.        Speech: Speech normal.        Behavior: Behavior normal. Behavior is cooperative.  Thought Content: Thought content normal.        Cognition and Memory: Cognition and memory normal.        Judgment: Judgment normal.    Assessment & Plan:  1. Encounter to establish care Appointment today to establish new primary care provider. Will get labs and progress notes from previous pcp to review and update patient chart.   2. Neoplasm of uncertain behavior of skin of face Small, mole-like lesion reported as getting larger per patient. Will refer to dermatology for further evaluation.  - Ambulatory referral to Dermatology  3. Major depressive disorder, single episode, severe (HCC) Currently stable. No medication changes made today. Will reassess at next visit. Will consider referral to psychiatry if indicated.   4. Body mass index (BMI) of 30.0-30.9 in adult Discussed lowering calorie intake to 1500 calories per day and incorporating exercise into daily routine to help lose weight. Will monitor.     Problem List Items Addressed This Visit       Musculoskeletal and Integument   Neoplasm of uncertain behavior of skin of face   Relevant Orders   Ambulatory referral to Dermatology     Other   Major depressive disorder, single episode, severe ([place])   Body mass index (BMI) of 30.0-30.9 in adult   Other Visit Diagnoses     Encounter to establish care    -  Primary       Outpatient Encounter Medications as of 03/04/2021  Medication Sig   cetirizine (ZYRTEC) 10 MG tablet Take 10 mg by mouth daily.   FLUoxetine (PROZAC) 40 MG capsule Take 40 mg by mouth daily.   fluticasone  (FLONASE) 50 MCG/ACT nasal spray Place 1 spray into both nostrils daily.   gabapentin (NEURONTIN) 400 MG capsule Take 400 mg by mouth 3 (three) times daily.   Multiple Vitamin (MULTIVITAMIN) tablet Take 1 tablet by mouth daily.   vitamin B-12 (CYANOCOBALAMIN) 500 MCG tablet Take 500 mcg by mouth daily.   amoxicillin-clavulanate (AUGMENTIN) 875-125 MG tablet Take 1 tablet by mouth every 12 (twelve) hours.   ibuprofen (ADVIL) 800 MG tablet Take 1 tablet (800 mg total) by mouth 3 (three) times daily. (Patient not taking: Reported on 09/23/2018)   No facility-administered encounter medications on file as of 03/04/2021.    Follow-up: Return in about 4 weeks (around 04/01/2021) for health maintenance exam, with pap - see below .   Ronnell Freshwater, NP

## 2021-03-09 DIAGNOSIS — Z6830 Body mass index (BMI) 30.0-30.9, adult: Secondary | ICD-10-CM | POA: Insufficient documentation

## 2021-03-09 DIAGNOSIS — D485 Neoplasm of uncertain behavior of skin: Secondary | ICD-10-CM | POA: Insufficient documentation

## 2021-03-09 NOTE — Patient Instructions (Signed)
Fat and Cholesterol Restricted Eating Plan Getting too much fat and cholesterol in your diet may cause health problems. Choosing the right foods helps keep your fat and cholesterol at normal levels. This can keep you from getting certain diseases. Your doctor may recommend an eating plan that includes: Total fat: ______% or less of total calories a day. This is ______g of fat a day. Saturated fat: ______% or less of total calories a day. This is ______g of saturated fat a day. Cholesterol: less than _________mg a day. Fiber: ______g a day. What are tips for following this plan? General tips Work with your doctor to lose weight if you need to. Avoid: Foods with added sugar. Fried foods. Foods with trans fat or partially hydrogenated oils. This includes some margarines and baked goods. If you drink alcohol: Limit how much you have to: 0-1 drink a day for women who are not pregnant. 0-2 drinks a day for men. Know how much alcohol is in a drink. In the [place] one drink equals one 12 oz bottle of beer (355 mL), one 5 oz glass of wine (148 mL), or one 1 oz glass of hard liquor (44 mL). Reading food labels Check food labels for: Trans fats. Partially hydrogenated oils. Saturated fat (g) in each serving. Cholesterol (mg) in each serving. Fiber (g) in each serving. Choose foods with healthy fats, such as: Monounsaturated fats and polyunsaturated fats. These include olive and canola oil, flaxseeds, walnuts, almonds, and seeds. Omega-3 fats. These are found in certain fish, flaxseed oil, and ground flaxseeds. Choose grain products that have whole grains. Look for the word "whole" as the first word in the ingredient list. Cooking Cook foods using low-fat methods. These include baking, boiling, grilling, and broiling. Eat more home-cooked foods. Eat at restaurants and buffets less often. Eat less fast food. Avoid cooking using saturated fats, such as butter, cream, palm oil, palm kernel oil, and  coconut oil. Meal planning  At meals, divide your plate into four equal parts: Fill one-half of your plate with vegetables, green salads, and fruit. Fill one-fourth of your plate with whole grains. Fill one-fourth of your plate with low-fat (lean) protein foods. Eat fish that is high in omega-3 fats at least two times a week. This includes mackerel, tuna, sardines, and salmon. Eat foods that are high in fiber, such as whole grains, beans, apples, pears, berries, broccoli, carrots, peas, and barley. What foods should I eat? Fruits All fresh, canned (in natural juice), or frozen fruits. Vegetables Fresh or frozen vegetables (raw, steamed, roasted, or grilled). Green salads. Grains Whole grains, such as whole wheat or whole grain breads, crackers, cereals, and pasta. Unsweetened oatmeal, bulgur, barley, quinoa, or brown rice. Corn or whole wheat flour tortillas. Meats and other protein foods Ground beef (85% or leaner), grass-fed beef, or beef trimmed of fat. Skinless chicken or turkey. Ground chicken or turkey. Pork trimmed of fat. All fish and seafood. Egg whites. Dried beans, peas, or lentils. Unsalted nuts or seeds. Unsalted canned beans. Nut butters without added sugar or oil. Dairy Low-fat or nonfat dairy products, such as skim or 1% milk, 2% or reduced-fat cheeses, low-fat and fat-free ricotta or cottage cheese, or plain low-fat and nonfat yogurt. Fats and oils Tub margarine without trans fats. Light or reduced-fat mayonnaise and salad dressings. Avocado. Olive, canola, sesame, or safflower oils. The items listed above may not be a complete list of foods and beverages you can eat. Contact a dietitian for more information. What foods   should I avoid? Fruits Canned fruit in heavy syrup. Fruit in cream or butter sauce. Fried fruit. Vegetables Vegetables cooked in cheese, cream, or butter sauce. Fried vegetables. Grains White bread. White pasta. White rice. Cornbread. Bagels, pastries,  and croissants. Crackers and snack foods that contain trans fat and hydrogenated oils. Meats and other protein foods Fatty cuts of meat. Ribs, chicken wings, bacon, sausage, bologna, salami, chitterlings, fatback, hot dogs, bratwurst, and packaged lunch meats. Liver and organ meats. Whole eggs and egg yolks. Chicken and turkey with skin. Fried meat. Dairy Whole or 2% milk, cream, half-and-half, and cream cheese. Whole milk cheeses. Whole-fat or sweetened yogurt. Full-fat cheeses. Nondairy creamers and whipped toppings. Processed cheese, cheese spreads, and cheese curds. Fats and oils Butter, stick margarine, lard, shortening, ghee, or bacon fat. Coconut, palm kernel, and palm oils. Beverages Alcohol. Sugar-sweetened drinks such as sodas, lemonade, and fruit drinks. Sweets and desserts Corn syrup, sugars, honey, and molasses. Candy. Jam and jelly. Syrup. Sweetened cereals. Cookies, pies, cakes, donuts, muffins, and ice cream. The items listed above may not be a complete list of foods and beverages you should avoid. Contact a dietitian for more information. Summary Choosing the right foods helps keep your fat and cholesterol at normal levels. This can keep you from getting certain diseases. At meals, fill one-half of your plate with vegetables, green salads, and fruits. Eat high fiber foods, like whole grains, beans, apples, pears, berries, carrots, peas, and barley. Limit added sugar, saturated fats, alcohol, and fried foods. This information is not intended to replace advice given to you by your health care provider. Make sure you discuss any questions you have with your health care provider. Document Revised: 08/09/2020 Document Reviewed: 08/09/2020 Elsevier Patient Education  2022 [employer]  

## 2021-03-17 ENCOUNTER — Encounter: Payer: Self-pay | Admitting: Nurse Practitioner

## 2021-03-17 ENCOUNTER — Other Ambulatory Visit: Payer: Self-pay

## 2021-03-17 ENCOUNTER — Ambulatory Visit: Payer: 59 | Admitting: Nurse Practitioner

## 2021-03-17 VITALS — BP 113/79 | HR 74 | Temp 98.3°F | Ht 66.0 in | Wt 190.2 lb

## 2021-03-17 DIAGNOSIS — F909 Attention-deficit hyperactivity disorder, unspecified type: Secondary | ICD-10-CM

## 2021-03-17 DIAGNOSIS — F322 Major depressive disorder, single episode, severe without psychotic features: Secondary | ICD-10-CM

## 2021-03-17 MED ORDER — AMPHETAMINE-DEXTROAMPHETAMINE 10 MG PO TABS: 5.0000 mg | ORAL_TABLET | Freq: Two times a day (BID) | ORAL | 0 refills | Status: DC

## 2021-03-17 NOTE — Progress Notes (Signed)
Established Patient Office Visit  Subjective:  Patient ID: Sylvia Maxwell, female    DOB: 07-08-1985  Age: 35 y.o. MRN: [id number]  CC:  Chief Complaint  Patient presents with   Follow-up   ADD    HPI Roseland Braun presents for assessment of adult onset ADD. She does see family services. She sees a psychiatrist there every thre months. She has been seeing him for about 1.5 years. She sees a Social worker once weekly. She state that starting the counseling services has changed her life for the best. She feels far removed from alcoholic past at this time. She does not have any problems with prescription drugs or illicit drugs.  I did review her PDMP profile. Her overdose risk score is 000 and she has not had any prescriptions for controlled medicines in the past two years. She did take an ASRS 1.1 today.    Adult ADHD Self Report Scale (most recent)     Adult ADHD Self-Report Scale (ASRS-v1.1) Symptom Checklist - 03/17/21 1024       Part A   1. How often do you have trouble wrapping up the final details of a project, once the challenging parts have been done? Often  2. How often do you have difficulty getting things done in order when you have to do a task that requires organization? Very Often    3. How often do you have problems remembering appointments or obligations? Often  4. When you have a task that requires a lot of thought, how often do you avoid or delay getting started? Very Often    5. How often do you fidget or squirm with your hands or feet when you have to sit down for a long time? Very Often  6. How often do you feel overly active and compelled to do things, like you were driven by a motor? Very Often      Part B   7. How often do you make careless mistakes when you have to work on a boring or difficult project? Often  8. How often do you have difficulty keeping your attention when you are doing boring or repetitive work? Very Often    9. How often do you have difficulty  concentrating on what people say to you, even when they are speaking to you directly? Often  10. How often do you misplace or have difficulty finding things at home or at work? Sometimes    11. How often are you distracted by activity or noise around you? Very Often  80. How often do you leave your seat in meetings or other situations in which you are expected to remain seated? Sometimes    13. How often do you feel restless or fidgety? Often  14. How often do you have difficulty unwinding and relaxing when you have time to yourself? Very Often    48. How often do you find yourself talking too much when you are in social situations? Very Often  69. When you are in a conversation, how often do you find yourself finishing the sentences of the people you are talking to, before they can finish them themselves? Often    17. How often do you have difficulty waiting your turn in situations when turn taking is required? Often  18. How often do you interrupt others when they are busy? Often      Comment   How old were you when these problems first began to occur? 8  The patient does not have further concerns or complaints today.   Past Medical History:  Diagnosis Date   Alcoholism ([place])    Bronchitis    Depression    Hepatitis    ? carrier  .01- HEP C - was told when she tried to give blood   Pneumonia    walking pneumonia   aspiriation Pneumonia 05/2017   Seizures ([place])    alcohol withdrawl    Past Surgical History:  Procedure Laterality Date   BREAST ENHANCEMENT SURGERY  2008   DILATION AND EVACUATION N/A 04/15/2018   Procedure: DILATATION AND EVACUATION;  Surgeon: Osborne Oman, MD;  Location: [facility];  Service: Gynecology;  Laterality: N/A;   OPEN REDUCTION, INTERNAL FIXATION (ORIF) CALCANEAL FRACTURE WITH FUSION Left 09/27/2018   Procedure: OPEN REDUCTION, INTERNAL FIXATION (ORIF) CALCANEAL FRACTURE AND EXPLORE PERONEAL TENDONS;  Surgeon: Erle Crocker, MD;   Location: [facility];  Service: Orthopedics;  Laterality: Left;    Family History  Problem Relation Age of Onset   Alcoholism Other    Diabetes Other     Social History   Socioeconomic History   Marital status: Married    Spouse name: Not on file   Number of children: Not on file   Years of education: Not on file   Highest education level: Not on file  Occupational History   Not on file  Tobacco Use   Smoking status: Former    Years: 10.00    Types: Cigarettes    Quit date: 05/2017    Years since quitting: 3.8   Smokeless tobacco: Never  Vaping Use   Vaping Use: Never used  Substance and Sexual Activity   Alcohol use: Yes   Drug use: Not Currently    Comment:  Meth, cocaine - 05/2017   Sexual activity: Yes  Other Topics Concern   Not on file  Social History Narrative   Not on file   Social Determinants of Health   Financial Resource Strain: Not on file  Food Insecurity: Not on file  Transportation Needs: Not on file  Physical Activity: Not on file  Stress: Not on file  Social Connections: Not on file  Intimate Partner Violence: Not on file    Outpatient Medications Prior to Visit  Medication Sig Dispense Refill   cetirizine (ZYRTEC) 10 MG tablet Take 10 mg by mouth daily.     FLUoxetine (PROZAC) 40 MG capsule Take 40 mg by mouth daily.     fluticasone (FLONASE) 50 MCG/ACT nasal spray Place 1 spray into both nostrils daily.     gabapentin (NEURONTIN) 400 MG capsule Take 400 mg by mouth 3 (three) times daily.     ibuprofen (ADVIL) 800 MG tablet Take 1 tablet (800 mg total) by mouth 3 (three) times daily. 21 tablet 0   Multiple Vitamin (MULTIVITAMIN) tablet Take 1 tablet by mouth daily.     PROZAC 10 MG capsule Take 10 mg by mouth daily.     vitamin B-12 (CYANOCOBALAMIN) 500 MCG tablet Take 500 mcg by mouth daily.     No facility-administered medications prior to visit.    Allergies  Allergen Reactions   Lactose Intolerance (Gi) Diarrhea and Nausea And Vomiting     Depends on the type of dairy-based product consumed as to which reaction occurs    ROS Review of Systems  Constitutional:  Positive for fatigue. Negative for activity change, appetite change, chills and fever.  HENT:  Negative for congestion, postnasal drip, rhinorrhea, sinus  pressure, sinus pain, sneezing and sore throat.   Eyes: Negative.   Respiratory:  Negative for cough, chest tightness, shortness of breath and wheezing.   Cardiovascular:  Negative for chest pain and palpitations.  Gastrointestinal:  Negative for abdominal pain, constipation, diarrhea, nausea and vomiting.  Endocrine: Negative for cold intolerance, heat intolerance, polydipsia and polyuria.  Genitourinary:  Negative for dyspareunia, dysuria, flank pain, frequency and urgency.  Musculoskeletal:  Negative for arthralgias, back pain and myalgias.  Skin:  Negative for rash.  Allergic/Immunologic: Negative for environmental allergies.  Neurological:  Negative for dizziness, weakness and headaches.  Hematological:  Negative for adenopathy.  Psychiatric/Behavioral:  Positive for decreased concentration. The patient is not nervous/anxious.      Objective:    Physical Exam Vitals and nursing note reviewed.  Constitutional:      Appearance: Normal appearance. She is well-developed.  HENT:     Head: Normocephalic and atraumatic.     Nose: Nose normal.     Mouth/Throat:     Mouth: Mucous membranes are moist.  Eyes:     Extraocular Movements: Extraocular movements intact.     Conjunctiva/sclera: Conjunctivae normal.     Pupils: Pupils are equal, round, and reactive to light.  Cardiovascular:     Rate and Rhythm: Normal rate and regular rhythm.     Pulses: Normal pulses.     Heart sounds: Normal heart sounds.  Pulmonary:     Effort: Pulmonary effort is normal.     Breath sounds: Normal breath sounds.  Abdominal:     Palpations: Abdomen is soft.  Musculoskeletal:        General: Normal range of motion.      Cervical back: Normal range of motion and neck supple.  Lymphadenopathy:     Cervical: No cervical adenopathy.  Skin:    General: Skin is warm and dry.     Capillary Refill: Capillary refill takes less than 2 seconds.  Neurological:     General: No focal deficit present.     Mental Status: She is alert and oriented to person, place, and time.  Psychiatric:        Attention and Perception: Attention and perception normal.        Mood and Affect: Mood normal.        Speech: Speech normal.        Behavior: Behavior normal. Behavior is cooperative.        Thought Content: Thought content normal.        Cognition and Memory: Cognition and memory normal.        Judgment: Judgment normal.     Comments: Patient having difficulty with concentration. She is unable to finish tasks or projects. Thoughts jump from one thing to the next. She states that friends and family often have trouble following her conversations due to jumping ideas so quickly.    Today's Vitals   03/17/21 1011  BP: 113/79  Pulse: 74  Temp: 98.3 F (36.8 C)  SpO2: 97%  Weight: 190 lb 3.2 oz (86.3 kg)  Height: 5\' 6"  (1.676 m)   Body mass index is 30.7 kg/m.   Wt Readings from Last 3 Encounters:  03/17/21 190 lb 3.2 oz (86.3 kg)  03/04/21 191 lb 1.9 oz (86.7 kg)  10/01/19 130 lb (59 kg)     Health Maintenance Due  Topic Date Due   Pneumococcal Vaccine 72-54 Years old (1 - PCV) Never done   TETANUS/TDAP  Never done   PAP SMEAR-Modifier  Never done   COVID-19 Vaccine (3 - Pfizer risk series) 10/15/2019   INFLUENZA VACCINE  Never done    There are no preventive care reminders to display for this patient.  Lab Results  Component Value Date   TSH 1.151 05/23/2017   Lab Results  Component Value Date   WBC 8.2 09/03/2019   HGB 14.7 09/03/2019   HCT 43.6 09/03/2019   MCV 93.2 09/03/2019   PLT 217 09/03/2019   Lab Results  Component Value Date   NA 142 09/03/2019   K 4.3 09/03/2019   CO2 26 09/03/2019    GLUCOSE 96 09/03/2019   BUN 9 09/03/2019   CREATININE 0.51 09/03/2019   BILITOT 0.5 09/03/2019   ALKPHOS 82 09/03/2019   AST 29 09/03/2019   ALT 23 09/03/2019   PROT 7.8 09/03/2019   ALBUMIN 4.6 09/03/2019   CALCIUM 8.9 09/03/2019   ANIONGAP 13 09/03/2019   No results found for: CHOL No results found for: HDL No results found for: [facility] Lab Results  Component Value Date   TRIG 64 05/26/2017   No results found for: CHOLHDL No results found for: HGBA1C    Assessment & Plan:  1. Adult ADHD (attention deficit hyperactivity disorder) Patient scoring 6 out of 6 on ASRS 1.1.  Strong indicator of adult ADHD.  Trial of Adderall 10 mg.  She may take 0.5 to 1 tablet up to twice daily if needed to help with focus and concentration.  I did review her PDMP profile today.  Her overdose risk score is 000.  She has had no controlled medicines prescribed in the last 2 years in [place], [place], or [place]. - amphetamine-dextroamphetamine (ADDERALL) 10 MG tablet; Take 0.5-1 tablets (5-10 mg total) by mouth 2 (two) times daily.  Dispense: 60 tablet; Refill: 0  2. Major depressive disorder, single episode, severe ([place]) Patient does have significant major depression.  She does see psychiatry at family [facility] once every 3 months.  They suggested she be assessed for ADHD however they do not prescribe controlled medicines at their office.  They are aware that she is being considered for stimulant therapy today.  She also sees a Social worker once weekly.  She should continue this therapy and follow-up with her psychiatrist as scheduled.  Problem List Items Addressed This Visit       Other   Major depressive disorder, single episode, severe ([place])   Relevant Medications   PROZAC 10 MG capsule   Adult ADHD (attention deficit hyperactivity disorder) - Primary   Relevant Medications   amphetamine-dextroamphetamine (ADDERALL) 10 MG tablet    Meds ordered this encounter   Medications   amphetamine-dextroamphetamine (ADDERALL) 10 MG tablet    Sig: Take 0.5-1 tablets (5-10 mg total) by mouth 2 (two) times daily.    Dispense:  60 tablet    Refill:  0    Order Specific Question:   Supervising Provider    Answer:   Beatrice Lecher D [[id number]]    Follow-up: Return in about 4 weeks (around 04/14/2021) for ADD - reassess .    Ronnell Freshwater, NP

## 2021-03-20 ENCOUNTER — Other Ambulatory Visit: Payer: Self-pay | Admitting: Nurse Practitioner

## 2021-03-20 DIAGNOSIS — Z6830 Body mass index (BMI) 30.0-30.9, adult: Secondary | ICD-10-CM

## 2021-03-20 DIAGNOSIS — F1010 Alcohol abuse, uncomplicated: Secondary | ICD-10-CM

## 2021-03-24 ENCOUNTER — Other Ambulatory Visit: Payer: 59

## 2021-03-24 ENCOUNTER — Other Ambulatory Visit: Payer: Self-pay

## 2021-03-24 DIAGNOSIS — F1010 Alcohol abuse, uncomplicated: Secondary | ICD-10-CM

## 2021-03-24 DIAGNOSIS — Z6830 Body mass index (BMI) 30.0-30.9, adult: Secondary | ICD-10-CM

## 2021-03-25 LAB — COMPREHENSIVE METABOLIC PANEL
ALT: 46 IU/L — ABNORMAL HIGH (ref 0–32)
AST: 50 IU/L — ABNORMAL HIGH (ref 0–40)
Albumin/Globulin Ratio: 1.6 (ref 1.2–2.2)
Albumin: 4.6 g/dL (ref 3.8–4.8)
Alkaline Phosphatase: 80 IU/L (ref 44–121)
BUN/Creatinine Ratio: 19 (ref 9–23)
BUN: 12 mg/dL (ref 6–20)
Bilirubin Total: 0.2 mg/dL (ref 0.0–1.2)
CO2: 22 mmol/L (ref 20–29)
Calcium: 9.4 mg/dL (ref 8.7–10.2)
Chloride: 101 mmol/L (ref 96–106)
Creatinine, Ser: 0.62 mg/dL (ref 0.57–1.00)
Globulin, Total: 2.8 g/dL (ref 1.5–4.5)
Glucose: 115 mg/dL — ABNORMAL HIGH (ref 70–99)
Potassium: 4.3 mmol/L (ref 3.5–5.2)
Sodium: 142 mmol/L (ref 134–144)
Total Protein: 7.4 g/dL (ref 6.0–8.5)
eGFR: 118 mL/min/{1.73_m2} (ref >59)

## 2021-03-25 LAB — CBC
Hematocrit: 46.4 % (ref 34.0–46.6)
Hemoglobin: 15.2 g/dL (ref 11.1–15.9)
MCH: 31.9 pg (ref 26.6–33.0)
MCHC: 32.8 g/dL (ref 31.5–35.7)
MCV: 98 fL — ABNORMAL HIGH (ref 79–97)
Platelets: 195 10*3/uL (ref 150–450)
RBC: 4.76 x10E6/uL (ref 3.77–5.28)
RDW: 12.3 % (ref 11.7–15.4)
WBC: 3.7 10*3/uL (ref 3.4–10.8)

## 2021-03-25 LAB — HIV ANTIBODY (ROUTINE TESTING W REFLEX): HIV Screen 4th Generation wRfx: NONREACTIVE

## 2021-03-25 LAB — LIPID PANEL
Chol/HDL Ratio: 1.6 ratio (ref 0.0–4.4)
Cholesterol, Total: 147 mg/dL (ref 100–199)
HDL: 94 mg/dL (ref >39)
LDL Chol Calc (NIH): 27 mg/dL (ref 0–99)
Triglycerides: 170 mg/dL — ABNORMAL HIGH (ref 0–149)
VLDL Cholesterol Cal: 26 mg/dL (ref 5–40)

## 2021-03-25 LAB — TSH: TSH: 1.11 u[IU]/mL (ref 0.450–4.500)

## 2021-03-25 LAB — VITAMIN D 25 HYDROXY (VIT D DEFICIENCY, FRACTURES): Vit D, 25-Hydroxy: 95.2 ng/mL (ref 30.0–100.0)

## 2021-03-25 LAB — HEMOGLOBIN A1C
Est. average glucose Bld gHb Est-mCnc: 105 mg/dL
Hgb A1c MFr Bld: 5.3 % (ref 4.8–5.6)

## 2021-03-25 LAB — HEPATITIS C ANTIBODY: Hep C Virus Ab: 1.4 s/co ratio — ABNORMAL HIGH (ref 0.0–0.9)

## 2021-03-25 NOTE — Progress Notes (Signed)
Patient with known history of Hep C. Review labs at visit 04/03/2021.

## 2021-04-03 ENCOUNTER — Encounter: Payer: 59 | Admitting: Nurse Practitioner

## 2021-04-11 ENCOUNTER — Ambulatory Visit (INDEPENDENT_AMBULATORY_CARE_PROVIDER_SITE_OTHER): Payer: 59 | Admitting: Nurse Practitioner

## 2021-04-11 ENCOUNTER — Other Ambulatory Visit: Payer: Self-pay

## 2021-04-11 ENCOUNTER — Encounter: Payer: Self-pay | Admitting: Nurse Practitioner

## 2021-04-11 VITALS — BP 119/80 | HR 98 | Temp 97.4°F | Ht 66.0 in | Wt 190.4 lb

## 2021-04-11 DIAGNOSIS — K732 Chronic active hepatitis, not elsewhere classified: Secondary | ICD-10-CM

## 2021-04-11 DIAGNOSIS — Z6830 Body mass index (BMI) 30.0-30.9, adult: Secondary | ICD-10-CM | POA: Diagnosis not present

## 2021-04-11 DIAGNOSIS — Z0001 Encounter for general adult medical examination with abnormal findings: Secondary | ICD-10-CM | POA: Diagnosis not present

## 2021-04-11 DIAGNOSIS — Z23 Encounter for immunization: Secondary | ICD-10-CM

## 2021-04-11 NOTE — Patient Instructions (Signed)
Preventive Care 29-37 Years Old, Female Preventive care refers to lifestyle choices and visits with your health care provider that can promote health and wellness. Preventive care visits are also called wellness exams. What can I expect for my preventive care visit? Counseling During your preventive care visit, your health care provider may ask about your: Medical history, including: Past medical problems. Family medical history. Pregnancy history. Current health, including: Menstrual cycle. Method of birth control. Emotional well-being. Home life and relationship well-being. Sexual activity and sexual health. Lifestyle, including: Alcohol, nicotine or tobacco, and drug use. Access to firearms. Diet, exercise, and sleep habits. Work and work Statistician. Sunscreen use. Safety issues such as seatbelt and bike helmet use. Physical exam Your health care provider may check your: Height and weight. These may be used to calculate your BMI (body mass index). BMI is a measurement that tells if you are at a healthy weight. Waist circumference. This measures the distance around your waistline. This measurement also tells if you are at a healthy weight and may help predict your risk of certain diseases, such as type 2 diabetes and high blood pressure. Heart rate and blood pressure. Body temperature. Skin for abnormal spots. What immunizations do I need? Vaccines are usually given at various ages, according to a schedule. Your health care provider will recommend vaccines for you based on your age, medical history, and lifestyle or other factors, such as travel or where you work. What tests do I need? Screening Your health care provider may recommend screening tests for certain conditions. This may include: Pelvic exam and Pap test. Lipid and cholesterol levels. Diabetes screening. This is done by checking your blood sugar (glucose) after you have not eaten for a while (fasting). Hepatitis B  test. Hepatitis C test. HIV (human immunodeficiency virus) test. STI (sexually transmitted infection) testing, if you are at risk. BRCA-related cancer screening. This may be done if you have a family history of breast, ovarian, tubal, or peritoneal cancers. Talk with your health care provider about your test results, treatment options, and if necessary, the need for more tests. Follow these instructions at home: Eating and drinking  Eat a healthy diet that includes fresh fruits and vegetables, whole grains, lean protein, and low-fat dairy products. Take vitamin and mineral supplements as recommended by your health care provider. Do not drink alcohol if: Your health care provider tells you not to drink. You are pregnant, may be pregnant, or are planning to become pregnant. If you drink alcohol: Limit how much you have to 0-1 drink a day. Know how much alcohol is in your drink. In the [place] one drink equals one 12 oz bottle of beer (355 mL), one 5 oz glass of wine (148 mL), or one 1 oz glass of hard liquor (44 mL). Lifestyle Brush your teeth every morning and night with fluoride toothpaste. Floss one time each day. Exercise for at least 30 minutes 5 or more days each week. Do not use any products that contain nicotine or tobacco. These products include cigarettes, chewing tobacco, and vaping devices, such as e-cigarettes. If you need help quitting, ask your health care provider. Do not use drugs. If you are sexually active, practice safe sex. Use a condom or other form of protection to prevent STIs. If you do not wish to become pregnant, use a form of birth control. If you plan to become pregnant, see your health care provider for a prepregnancy visit. Find healthy ways to manage stress, such as: Meditation, yoga,  or listening to music. °Journaling. °Talking to a trusted person. °Spending time with friends and family. °Minimize exposure to UV radiation to reduce your risk of skin  cancer. °Safety °Always wear your seat belt while driving or riding in a vehicle. °Do not drive: °If you have been drinking alcohol. Do not ride with someone who has been drinking. °If you have been using any mind-altering substances or drugs. °While texting. °When you are tired or distracted. °Wear a helmet and other protective equipment during sports activities. °If you have firearms in your house, make sure you follow all gun safety procedures. °Seek help if you have been physically or sexually abused. °What's next? °Go to your health care provider once a year for an annual wellness visit. °Ask your health care provider how often you should have your eyes and teeth checked. °Stay up to date on all vaccines. °This information is not intended to replace advice given to you by your health care provider. Make sure you discuss any questions you have with your health care provider. °Document Revised: 09/25/2020 Document Reviewed: 09/25/2020 °Elsevier Patient Education © 2022 [employer] ° °

## 2021-04-11 NOTE — Progress Notes (Signed)
Established Patient Office Visit  Subjective:  Patient ID: Sylvia Maxwell, female    DOB: 1985-05-27  Age: 35 y.o. MRN: [id number]  CC:  Chief Complaint  Patient presents with   Annual Exam    HPI Sylvia Maxwell presents for annual wellness visit.  She states she is feeling well.  She did have routine, fasting labs done prior to this visit.  Her triglycerides were slightly elevated, but lipid panel was otherwise normal.   Lipid Panel     Component Value Date/Time   CHOL 147 03/24/2021 0921   TRIG 170 (H) 03/24/2021 0921   HDL 94 03/24/2021 0921   CHOLHDL 1.6 03/24/2021 0921   LDLCALC 27 03/24/2021 0921   LABVLDL 26 03/24/2021 0921  Liver functions were slightly elevated and her screening test for hepatitis C was positive.  She has not seen a liver specialist in the past or had treatment for hepatitis C.  Her other labs were essentially normal. She has no new concerns or complaints today. She would like to get Tdap vaccine.  Past Medical History:  Diagnosis Date   Alcoholism ([place])    Bronchitis    Depression    Hepatitis    ? carrier  .01- HEP C - was told when she tried to give blood   Pneumonia    walking pneumonia   aspiriation Pneumonia 05/2017   Seizures ([place])    alcohol withdrawl    Past Surgical History:  Procedure Laterality Date   BREAST ENHANCEMENT SURGERY  2008   DILATION AND EVACUATION N/A 04/15/2018   Procedure: DILATATION AND EVACUATION;  Surgeon: Osborne Oman, MD;  Location: [facility];  Service: Gynecology;  Laterality: N/A;   OPEN REDUCTION, INTERNAL FIXATION (ORIF) CALCANEAL FRACTURE WITH FUSION Left 09/27/2018   Procedure: OPEN REDUCTION, INTERNAL FIXATION (ORIF) CALCANEAL FRACTURE AND EXPLORE PERONEAL TENDONS;  Surgeon: Erle Crocker, MD;  Location: [facility];  Service: Orthopedics;  Laterality: Left;    Family History  Problem Relation Age of Onset   Alcoholism Other    Diabetes Other     Social History   Socioeconomic History   Marital status:  Married    Spouse name: Not on file   Number of children: Not on file   Years of education: Not on file   Highest education level: Not on file  Occupational History   Not on file  Tobacco Use   Smoking status: Former    Years: 10.00    Types: Cigarettes    Quit date: 05/2017    Years since quitting: 3.9   Smokeless tobacco: Never  Vaping Use   Vaping Use: Never used  Substance and Sexual Activity   Alcohol use: Yes   Drug use: Not Currently    Comment:  Meth, cocaine - 05/2017   Sexual activity: Yes  Other Topics Concern   Not on file  Social History Narrative   Not on file   Social Determinants of Health   Financial Resource Strain: Not on file  Food Insecurity: Not on file  Transportation Needs: Not on file  Physical Activity: Not on file  Stress: Not on file  Social Connections: Not on file  Intimate Partner Violence: Not on file    Outpatient Medications Prior to Visit  Medication Sig Dispense Refill   cetirizine (ZYRTEC) 10 MG tablet Take 10 mg by mouth daily.     fluticasone (FLONASE) 50 MCG/ACT nasal spray Place 1 spray into both nostrils daily.     gabapentin (  NEURONTIN) 400 MG capsule Take 400 mg by mouth 3 (three) times daily.     ibuprofen (ADVIL) 800 MG tablet Take 1 tablet (800 mg total) by mouth 3 (three) times daily. 21 tablet 0   Multiple Vitamin (MULTIVITAMIN) tablet Take 1 tablet by mouth daily.     PROZAC 10 MG capsule Take 10 mg by mouth daily.     vitamin B-12 (CYANOCOBALAMIN) 500 MCG tablet Take 500 mcg by mouth daily.     amphetamine-dextroamphetamine (ADDERALL) 10 MG tablet Take 0.5-1 tablets (5-10 mg total) by mouth 2 (two) times daily. 60 tablet 0   FLUoxetine (PROZAC) 40 MG capsule Take 40 mg by mouth daily. (Patient not taking: Reported on 04/11/2021)     No facility-administered medications prior to visit.    Allergies  Allergen Reactions   Lactose Intolerance (Gi) Diarrhea and Nausea And Vomiting    Depends on the type of dairy-based  product consumed as to which reaction occurs    ROS Review of Systems  Constitutional:  Negative for activity change, appetite change, chills, fatigue and fever.  HENT:  Negative for congestion, postnasal drip, rhinorrhea, sinus pressure, sinus pain, sneezing and sore throat.   Eyes: Negative.   Respiratory:  Negative for cough, chest tightness, shortness of breath and wheezing.   Cardiovascular:  Negative for chest pain and palpitations.  Gastrointestinal:  Negative for abdominal pain, constipation, diarrhea, nausea and vomiting.  Endocrine: Negative for cold intolerance, heat intolerance, polydipsia and polyuria.  Genitourinary:  Negative for dyspareunia, dysuria, flank pain, frequency and urgency.  Musculoskeletal:  Negative for arthralgias, back pain and myalgias.  Skin:  Negative for rash.  Allergic/Immunologic: Negative for environmental allergies.  Neurological:  Negative for dizziness, weakness and headaches.  Hematological:  Negative for adenopathy.  Psychiatric/Behavioral:  Positive for decreased concentration and dysphoric mood. The patient is nervous/anxious.      Objective:    Physical Exam Vitals and nursing note reviewed.  Constitutional:      Appearance: Normal appearance. She is well-developed.  HENT:     Head: Normocephalic and atraumatic.     Right Ear: Tympanic membrane, ear canal and external ear normal.     Left Ear: Tympanic membrane, ear canal and external ear normal.     Nose: Nose normal.     Mouth/Throat:     Mouth: Mucous membranes are moist.     Pharynx: Oropharynx is clear.  Eyes:     Extraocular Movements: Extraocular movements intact.     Conjunctiva/sclera: Conjunctivae normal.     Pupils: Pupils are equal, round, and reactive to light.  Cardiovascular:     Rate and Rhythm: Normal rate and regular rhythm.     Pulses: Normal pulses.     Heart sounds: Normal heart sounds.  Pulmonary:     Effort: Pulmonary effort is normal.     Breath sounds:  Normal breath sounds.  Abdominal:     General: Bowel sounds are normal. There is no distension.     Palpations: Abdomen is soft. There is no mass.     Tenderness: There is no abdominal tenderness. There is no guarding or rebound.     Hernia: No hernia is present.  Musculoskeletal:        General: Normal range of motion.     Cervical back: Normal range of motion and neck supple.  Lymphadenopathy:     Cervical: No cervical adenopathy.  Skin:    General: Skin is warm and dry.     Capillary  Refill: Capillary refill takes less than 2 seconds.  Neurological:     General: No focal deficit present.     Mental Status: She is alert and oriented to person, place, and time.  Psychiatric:        Mood and Affect: Mood normal.        Behavior: Behavior normal.        Thought Content: Thought content normal.        Judgment: Judgment normal.    Today's Vitals   04/11/21 1022  BP: 119/80  Pulse: 98  Temp: (!) 97.4 F (36.3 C)  SpO2: 97%  Weight: 190 lb 6.4 oz (86.4 kg)  Height: '5\' 6"'  (1.676 m)   Body mass index is 30.73 kg/m.   Wt Readings from Last 3 Encounters:  04/18/21 194 lb 3.2 oz (88.1 kg)  04/11/21 190 lb 6.4 oz (86.4 kg)  03/17/21 190 lb 3.2 oz (86.3 kg)     Health Maintenance Due  Topic Date Due   Pneumococcal Vaccine 41-30 Years old (1 - PCV) Never done   PAP SMEAR-Modifier  Never done   COVID-19 Vaccine (3 - Pfizer risk series) 10/15/2019   INFLUENZA VACCINE  Never done    There are no preventive care reminders to display for this patient.  Lab Results  Component Value Date   TSH 1.110 03/24/2021   Lab Results  Component Value Date   WBC 3.7 03/24/2021   HGB 15.2 03/24/2021   HCT 46.4 03/24/2021   MCV 98 (H) 03/24/2021   PLT 195 03/24/2021   Lab Results  Component Value Date   NA 142 03/24/2021   K 4.3 03/24/2021   CO2 22 03/24/2021   GLUCOSE 115 (H) 03/24/2021   BUN 12 03/24/2021   CREATININE 0.62 03/24/2021   BILITOT 0.2 03/24/2021   ALKPHOS 80  03/24/2021   AST 50 (H) 03/24/2021   ALT 46 (H) 03/24/2021   PROT 7.4 03/24/2021   ALBUMIN 4.6 03/24/2021   CALCIUM 9.4 03/24/2021   ANIONGAP 13 09/03/2019   EGFR 118 03/24/2021   Lab Results  Component Value Date   CHOL 147 03/24/2021   Lab Results  Component Value Date   HDL 94 03/24/2021   Lab Results  Component Value Date   LDLCALC 27 03/24/2021   Lab Results  Component Value Date   TRIG 170 (H) 03/24/2021   Lab Results  Component Value Date   CHOLHDL 1.6 03/24/2021   Lab Results  Component Value Date   HGBA1C 5.3 03/24/2021      Assessment & Plan:  1. Encounter for general adult medical examination with abnormal findings Annual wellness visit today.  2. Chronic active hepatitis ([place]) Reviewed labs.  Patient positive for hepatitis C elevated liver enzymes.  Will refer to Dr. Allen Norris, GI.  For further evaluation and treatment. - Ambulatory referral to Gastroenterology  3. Body mass index (BMI) of 30.0-30.9 in adult Discussed lowering calorie intake to 1500 calories per day and incorporating exercise into daily routine to help lose weight.   4. Need for Tdap vaccination Tdap vaccine administered during today's visit. - Tdap vaccine greater than or equal to 7yo IM   Problem List Items Addressed This Visit       Digestive   Chronic active hepatitis [facility])   Relevant Orders   Ambulatory referral to Gastroenterology     Other   Body mass index (BMI) of 30.0-30.9 in adult   Other Visit Diagnoses     Encounter for  general adult medical examination with abnormal findings    -  Primary   Need for Tdap vaccination       Relevant Orders   Tdap vaccine greater than or equal to 7yo IM (Completed)        Follow-up: Return in about 1 week (around 04/18/2021) for ADHD, as scheduled, with pap.    Ronnell Freshwater, NP  This note was dictated using Systems analyst. Rapid proofreading was performed to expedite the delivery of the information.  Despite proofreading, phonetic errors will occur which are common with this voice recognition software. Please take this into consideration. If there are any concerns, please contact our office.

## 2021-04-16 ENCOUNTER — Telehealth: Payer: Self-pay | Admitting: Family Medicine

## 2021-04-16 NOTE — Telephone Encounter (Signed)
Note sent via MyChart:   Dear Sylvia Maxwell,  I am the supervising physician for [facility] and recently reviewed your chart.  I see that [name] documented your symptoms consistent with ADHD. I know she gave you a trial of medication for one month.  I would highly recommend that you follow up with your psychiatrist for more formal testing and evaluation for ADHD so that your diagnosis can be confirmed and determine if there are any overlapping symptoms that need to be addressed.  We will no longer be prescribing a stimulant medication for treatment.  We are happy to discuss non-stimulant options that are available to see if that helps you.  If you feel you really benefit from stimulant treatment then please talk with your psychiatrist.    Sincerely,  Beatrice Lecher MD  Added controlled medication Flag to chart.

## 2021-04-18 ENCOUNTER — Other Ambulatory Visit: Payer: Self-pay

## 2021-04-18 ENCOUNTER — Ambulatory Visit (INDEPENDENT_AMBULATORY_CARE_PROVIDER_SITE_OTHER): Payer: 59 | Admitting: Nurse Practitioner

## 2021-04-18 ENCOUNTER — Encounter: Payer: Self-pay | Admitting: Nurse Practitioner

## 2021-04-18 VITALS — BP 120/82 | HR 86 | Temp 98.0°F | Ht 66.0 in | Wt 194.2 lb

## 2021-04-18 DIAGNOSIS — Z6831 Body mass index (BMI) 31.0-31.9, adult: Secondary | ICD-10-CM

## 2021-04-18 DIAGNOSIS — F909 Attention-deficit hyperactivity disorder, unspecified type: Secondary | ICD-10-CM

## 2021-04-18 DIAGNOSIS — F322 Major depressive disorder, single episode, severe without psychotic features: Secondary | ICD-10-CM

## 2021-04-18 MED ORDER — BUPROPION HCL 75 MG PO TABS: 75.0000 mg | ORAL_TABLET | Freq: Two times a day (BID) | ORAL | 1 refills | Status: DC

## 2021-04-18 NOTE — Progress Notes (Signed)
Established Patient Office Visit  Subjective:  Patient ID: Sylvia Maxwell, female    DOB: 07/29/1985  Age: 36 y.o. MRN: [id number]  CC:  Chief Complaint  Patient presents with   Follow-up   ADHD    HPI Sylvia Maxwell presents for follow up of ADHD. She sees psychiatrist at [facility]. She asked to be evaluated for ADHD. They had suggested she be formally evaluated, outside of that office, as they do not prescribed controlled medications. A screening done in this office, was positive for adult ADHD. She was started on Adderall of 48m twice daily. She states that she has done very well with this medication. She states that when she first started the medication, it did cause her some insomnia, but as she was on this longer, this was not an issue. She reports no other negative side effects from taking this.    Past Medical History:  Diagnosis Date   [place]    Bronchitis    Depression    Hepatitis    ? carrier  .01- HEP C - was told when she tried to give blood   Pneumonia    walking pneumonia   aspiriation Pneumonia 05/2017   [place]    alcohol withdrawl    Past Surgical History:  Procedure Laterality Date   BREAST ENHANCEMENT SURGERY  2008   DILATION AND EVACUATION N/A 04/15/2018   Procedure: DILATATION AND EVACUATION;  Surgeon: AOsborne Oman MD;  [facility];  Service: Gynecology;  Laterality: N/A;   OPEN REDUCTION, INTERNAL FIXATION (ORIF) CALCANEAL FRACTURE WITH FUSION Left 09/27/2018   Procedure: OPEN REDUCTION, INTERNAL FIXATION (ORIF) CALCANEAL FRACTURE AND EXPLORE PERONEAL TENDONS;  Surgeon: AErle Crocker MD;  [place] [facility]  Service: Orthopedics;  Laterality: Left;    Family History  Problem Relation Age of Onset   Alcoholism Other    Diabetes Other     Social History   Socioeconomic History   Marital status: Married    Spouse name: Not on file   Number of children: Not on file   Years of education: Not on file   Highest  education level: Not on file  Occupational History   Not on file  Tobacco Use   Smoking status: Former    Years: 10.00    Types: Cigarettes    Quit date: 05/2017    Years since quitting: 3.9   Smokeless tobacco: Never  Vaping Use   Vaping Use: Never used  Substance and Sexual Activity   Alcohol use: Yes   Drug use: Not Currently    Comment:  Meth, cocaine - 05/2017   Sexual activity: Yes  Other Topics Concern   Not on file  Social History Narrative   Not on file   Social Determinants of Health   Financial Resource Strain: Not on file  Food Insecurity: Not on file  Transportation Needs: Not on file  Physical Activity: Not on file  Stress: Not on file  Social Connections: Not on file  Intimate Partner Violence: Not on file    Outpatient Medications Prior to Visit  Medication Sig Dispense Refill   cetirizine (ZYRTEC) 10 MG tablet Take 10 mg by mouth daily.     fluticasone (FLONASE) 50 MCG/ACT nasal spray Place 1 spray into both nostrils daily.     gabapentin (NEURONTIN) 400 MG capsule Take 400 mg by mouth 3 (three) times daily.     ibuprofen (ADVIL) 800 MG tablet Take 1 tablet (800 mg total) by  mouth 3 (three) times daily. 21 tablet 0   Multiple Vitamin (MULTIVITAMIN) tablet Take 1 tablet by mouth daily.     PROZAC 10 MG capsule Take 10 mg by mouth daily.     vitamin B-12 (CYANOCOBALAMIN) 500 MCG tablet Take 500 mcg by mouth daily.     amphetamine-dextroamphetamine (ADDERALL) 10 MG tablet Take 0.5-1 tablets (5-10 mg total) by mouth 2 (two) times daily. 60 tablet 0   No facility-administered medications prior to visit.    Allergies  Allergen Reactions   Lactose Intolerance (Gi) Diarrhea and Nausea And Vomiting    Depends on the type of dairy-based product consumed as to which reaction occurs    ROS Review of Systems  Constitutional:  Negative for activity change, appetite change, chills, fatigue and fever.  HENT:  Negative for congestion, postnasal drip, rhinorrhea,  sinus pressure, sinus pain, sneezing and sore throat.   Eyes: Negative.   Respiratory:  Negative for cough, chest tightness, shortness of breath and wheezing.   Cardiovascular:  Negative for chest pain and palpitations.  Gastrointestinal:  Negative for abdominal pain, constipation, diarrhea, nausea and vomiting.  Endocrine: Negative for cold intolerance, heat intolerance, polydipsia and polyuria.  Genitourinary:  Negative for dyspareunia, dysuria, flank pain, frequency and urgency.  Musculoskeletal:  Negative for arthralgias, back pain and myalgias.  Skin:  Negative for rash.  Allergic/Immunologic: Negative for environmental allergies.  Neurological:  Negative for dizziness, weakness and headaches.  Hematological:  Negative for adenopathy.  Psychiatric/Behavioral:  Positive for decreased concentration and dysphoric mood. The patient is nervous/anxious.        Improvided concentration level on adderall of 69m     Objective:    Physical Exam Vitals and nursing note reviewed.  Constitutional:      Appearance: Normal appearance. She is well-developed.  HENT:     Head: Normocephalic and atraumatic.     Nose: Nose normal.     Mouth/Throat:     Mouth: Mucous membranes are moist.  Eyes:     Extraocular Movements: Extraocular movements intact.     Conjunctiva/sclera: Conjunctivae normal.     Pupils: Pupils are equal, round, and reactive to light.  Cardiovascular:     Rate and Rhythm: Normal rate and regular rhythm.     Pulses: Normal pulses.     Heart sounds: Normal heart sounds.  Pulmonary:     Effort: Pulmonary effort is normal.     Breath sounds: Normal breath sounds.  Abdominal:     Palpations: Abdomen is soft.  Musculoskeletal:        General: Normal range of motion.     Cervical back: Normal range of motion and neck supple.  Lymphadenopathy:     Cervical: No cervical adenopathy.  Skin:    General: Skin is warm and dry.     Capillary Refill: Capillary refill takes less  than 2 seconds.  Neurological:     General: No focal deficit present.     Mental Status: She is alert and oriented to person, place, and time.  Psychiatric:        Mood and Affect: Affect normal. Mood is anxious.        Speech: Speech normal.        Behavior: Behavior normal. Behavior is cooperative.        Thought Content: Thought content normal.        Cognition and Memory: Cognition and memory normal.        Judgment: Judgment normal.    Today's  Vitals   04/18/21 1120  BP: 120/82  Pulse: 86  Temp: 98 F (36.7 C)  SpO2: 98%  Weight: 194 lb 3.2 oz (88.1 kg)  Height: '5\' 6"'  (1.676 m)   Body mass index is 31.34 kg/m.   Wt Readings from Last 3 Encounters:  04/18/21 194 lb 3.2 oz (88.1 kg)  04/11/21 190 lb 6.4 oz (86.4 kg)  03/17/21 190 lb 3.2 oz (86.3 kg)     Health Maintenance Due  Topic Date Due   Pneumococcal Vaccine 25-62 Years old (1 - PCV) Never done   PAP SMEAR-Modifier  Never done   COVID-19 Vaccine (3 - Pfizer risk series) 10/15/2019   INFLUENZA VACCINE  Never done    There are no preventive care reminders to display for this patient.  Lab Results  Component Value Date   TSH 1.110 03/24/2021   Lab Results  Component Value Date   WBC 3.7 03/24/2021   HGB 15.2 03/24/2021   HCT 46.4 03/24/2021   MCV 98 (H) 03/24/2021   PLT 195 03/24/2021   Lab Results  Component Value Date   NA 142 03/24/2021   K 4.3 03/24/2021   CO2 22 03/24/2021   GLUCOSE 115 (H) 03/24/2021   BUN 12 03/24/2021   CREATININE 0.62 03/24/2021   BILITOT 0.2 03/24/2021   ALKPHOS 80 03/24/2021   AST 50 (H) 03/24/2021   ALT 46 (H) 03/24/2021   PROT 7.4 03/24/2021   ALBUMIN 4.6 03/24/2021   CALCIUM 9.4 03/24/2021   ANIONGAP 13 09/03/2019   EGFR 118 03/24/2021   Lab Results  Component Value Date   CHOL 147 03/24/2021   Lab Results  Component Value Date   HDL 94 03/24/2021   Lab Results  Component Value Date   LDLCALC 27 03/24/2021   Lab Results  Component Value Date    TRIG 170 (H) 03/24/2021   Lab Results  Component Value Date   CHOLHDL 1.6 03/24/2021   Lab Results  Component Value Date   HGBA1C 5.3 03/24/2021      Assessment & Plan:  1. Adult ADHD (attention deficit hyperactivity disorder) Discontinue treatment with Adderall. Trial of wellbutrin 27m. Advised she start with 1 tablet daily for first week. May increase this to twice daily as needed and as tolerated. Will refer for diagnostic testing for aDHD. Will reassess treatment plan as indicated.  - buPROPion (WELLBUTRIN) 75 MG tablet; Take 1 tablet (75 mg total) by mouth 2 (two) times daily.  Dispense: 60 tablet; Refill: 1 - Ambulatory referral to Psychiatry  2. Major depressive disorder, single episode, severe ([place] Add wellbutrin 7109m Start with one tablet daily. May increase this to twice daily as needed and as tolerated. Referral to psychiatry for diagnostic testing made today.  - buPROPion (WELLBUTRIN) 75 MG tablet; Take 1 tablet (75 mg total) by mouth 2 (two) times daily.  Dispense: 60 tablet; Refill: 1 - Ambulatory referral to Psychiatry  3. Body mass index (BMI) of 31.0-31.9 in adult Discussed lowering calorie intake to 1500 calories per day and incorporating exercise into daily routine to help lose weight.     Problem List Items Addressed This Visit       Other   Major depressive disorder, single episode, severe ([place]  Relevant Medications   buPROPion (WELLBUTRIN) 75 MG tablet   Other Relevant Orders   Ambulatory referral to Psychiatry   Adult ADHD (attention deficit hyperactivity disorder) - Primary   Relevant Medications   buPROPion (WELLBUTRIN) 75 MG tablet  Other Relevant Orders   Ambulatory referral to Psychiatry   Other Visit Diagnoses     Body mass index (BMI) of 31.0-31.9 in adult           Meds ordered this encounter  Medications   buPROPion (WELLBUTRIN) 75 MG tablet    Sig: Take 1 tablet (75 mg total) by mouth 2 (two) times daily.    Dispense:  60  tablet    Refill:  1    Order Specific Question:   Supervising Provider    Answer:   Beatrice Lecher D [[id number]]    Follow-up: Return in about 4 weeks (around 05/16/2021) for ADD - trial wellbutrin , with pap (pap rescheduled from last visit).    Ronnell Freshwater, NP

## 2021-04-20 DIAGNOSIS — K732 Chronic active hepatitis, not elsewhere classified: Secondary | ICD-10-CM | POA: Insufficient documentation

## 2021-04-20 NOTE — Patient Instructions (Signed)
Fat and Cholesterol Restricted Eating Plan Getting too much fat and cholesterol in your diet may cause health problems. Choosing the right foods helps keep your fat and cholesterol at normal levels. This can keep you from getting certain diseases. Your doctor may recommend an eating plan that includes: Total fat: ______% or less of total calories a day. This is ______g of fat a day. Saturated fat: ______% or less of total calories a day. This is ______g of saturated fat a day. Cholesterol: less than _________mg a day. Fiber: ______g a day. What are tips for following this plan? General tips Work with your doctor to lose weight if you need to. Avoid: Foods with added sugar. Fried foods. Foods with trans fat or partially hydrogenated oils. This includes some margarines and baked goods. If you drink alcohol: Limit how much you have to: 0-1 drink a day for women who are not pregnant. 0-2 drinks a day for men. Know how much alcohol is in a drink. In the [place] one drink equals one 12 oz bottle of beer (355 mL), one 5 oz glass of wine (148 mL), or one 1 oz glass of hard liquor (44 mL). Reading food labels Check food labels for: Trans fats. Partially hydrogenated oils. Saturated fat (g) in each serving. Cholesterol (mg) in each serving. Fiber (g) in each serving. Choose foods with healthy fats, such as: Monounsaturated fats and polyunsaturated fats. These include olive and canola oil, flaxseeds, walnuts, almonds, and seeds. Omega-3 fats. These are found in certain fish, flaxseed oil, and ground flaxseeds. Choose grain products that have whole grains. Look for the word "whole" as the first word in the ingredient list. Cooking Cook foods using low-fat methods. These include baking, boiling, grilling, and broiling. Eat more home-cooked foods. Eat at restaurants and buffets less often. Eat less fast food. Avoid cooking using saturated fats, such as butter, cream, palm oil, palm kernel oil, and  coconut oil. Meal planning  At meals, divide your plate into four equal parts: Fill one-half of your plate with vegetables, green salads, and fruit. Fill one-fourth of your plate with whole grains. Fill one-fourth of your plate with low-fat (lean) protein foods. Eat fish that is high in omega-3 fats at least two times a week. This includes mackerel, tuna, sardines, and salmon. Eat foods that are high in fiber, such as whole grains, beans, apples, pears, berries, broccoli, carrots, peas, and barley. What foods should I eat? Fruits All fresh, canned (in natural juice), or frozen fruits. Vegetables Fresh or frozen vegetables (raw, steamed, roasted, or grilled). Green salads. Grains Whole grains, such as whole wheat or whole grain breads, crackers, cereals, and pasta. Unsweetened oatmeal, bulgur, barley, quinoa, or brown rice. Corn or whole wheat flour tortillas. Meats and other protein foods Ground beef (85% or leaner), grass-fed beef, or beef trimmed of fat. Skinless chicken or turkey. Ground chicken or turkey. Pork trimmed of fat. All fish and seafood. Egg whites. Dried beans, peas, or lentils. Unsalted nuts or seeds. Unsalted canned beans. Nut butters without added sugar or oil. Dairy Low-fat or nonfat dairy products, such as skim or 1% milk, 2% or reduced-fat cheeses, low-fat and fat-free ricotta or cottage cheese, or plain low-fat and nonfat yogurt. Fats and oils Tub margarine without trans fats. Light or reduced-fat mayonnaise and salad dressings. Avocado. Olive, canola, sesame, or safflower oils. The items listed above may not be a complete list of foods and beverages you can eat. Contact a dietitian for more information. What foods   should I avoid? Fruits Canned fruit in heavy syrup. Fruit in cream or butter sauce. Fried fruit. Vegetables Vegetables cooked in cheese, cream, or butter sauce. Fried vegetables. Grains White bread. White pasta. White rice. Cornbread. Bagels, pastries,  and croissants. Crackers and snack foods that contain trans fat and hydrogenated oils. Meats and other protein foods Fatty cuts of meat. Ribs, chicken wings, bacon, sausage, bologna, salami, chitterlings, fatback, hot dogs, bratwurst, and packaged lunch meats. Liver and organ meats. Whole eggs and egg yolks. Chicken and turkey with skin. Fried meat. Dairy Whole or 2% milk, cream, half-and-half, and cream cheese. Whole milk cheeses. Whole-fat or sweetened yogurt. Full-fat cheeses. Nondairy creamers and whipped toppings. Processed cheese, cheese spreads, and cheese curds. Fats and oils Butter, stick margarine, lard, shortening, ghee, or bacon fat. Coconut, palm kernel, and palm oils. Beverages Alcohol. Sugar-sweetened drinks such as sodas, lemonade, and fruit drinks. Sweets and desserts Corn syrup, sugars, honey, and molasses. Candy. Jam and jelly. Syrup. Sweetened cereals. Cookies, pies, cakes, donuts, muffins, and ice cream. The items listed above may not be a complete list of foods and beverages you should avoid. Contact a dietitian for more information. Summary Choosing the right foods helps keep your fat and cholesterol at normal levels. This can keep you from getting certain diseases. At meals, fill one-half of your plate with vegetables, green salads, and fruits. Eat high fiber foods, like whole grains, beans, apples, pears, berries, carrots, peas, and barley. Limit added sugar, saturated fats, alcohol, and fried foods. This information is not intended to replace advice given to you by your health care provider. Make sure you discuss any questions you have with your health care provider. Document Revised: 08/09/2020 Document Reviewed: 08/09/2020 Elsevier Patient Education  2022 [employer]  

## 2021-05-15 ENCOUNTER — Other Ambulatory Visit: Payer: Self-pay

## 2021-05-15 ENCOUNTER — Ambulatory Visit (INDEPENDENT_AMBULATORY_CARE_PROVIDER_SITE_OTHER): Payer: 59 | Admitting: Nurse Practitioner

## 2021-05-15 ENCOUNTER — Ambulatory Visit: Payer: 59 | Admitting: Nurse Practitioner

## 2021-05-15 ENCOUNTER — Other Ambulatory Visit (HOSPITAL_COMMUNITY)
Admission: RE | Admit: 2021-05-15 | Discharge: 2021-05-15 | Disposition: A | Discharge status: Home / Self Care | Payer: 59 | Source: Ambulatory Visit | Attending: Nurse Practitioner | Admitting: Nurse Practitioner

## 2021-05-15 ENCOUNTER — Encounter: Payer: Self-pay | Admitting: Nurse Practitioner

## 2021-05-15 VITALS — BP 118/85 | HR 99 | Temp 97.9°F | Ht 66.0 in | Wt 191.4 lb

## 2021-05-15 DIAGNOSIS — Z01419 Encounter for gynecological examination (general) (routine) without abnormal findings: Secondary | ICD-10-CM | POA: Insufficient documentation

## 2021-05-15 DIAGNOSIS — Z6830 Body mass index (BMI) 30.0-30.9, adult: Secondary | ICD-10-CM

## 2021-05-15 DIAGNOSIS — Z124 Encounter for screening for malignant neoplasm of cervix: Secondary | ICD-10-CM

## 2021-05-15 DIAGNOSIS — F909 Attention-deficit hyperactivity disorder, unspecified type: Secondary | ICD-10-CM

## 2021-05-15 DIAGNOSIS — F322 Major depressive disorder, single episode, severe without psychotic features: Secondary | ICD-10-CM

## 2021-05-15 MED ORDER — BUPROPION HCL ER (XL) 150 MG PO TB24: 150.0000 mg | ORAL_TABLET | Freq: Every day | ORAL | 2 refills | Status: DC

## 2021-05-15 NOTE — Progress Notes (Signed)
Established patient visit   Patient: Sylvia Maxwell   DOB: Jan 01, 1986   36 y.o. Female  MRN: [id number] Visit Date: 05/15/2021   Chief Complaint  Patient presents with   Follow-up   Medication Dose Change   Gynecologic Exam   Subjective    HPI  The patient is here for routine follow up visit. Was started on wellbutrin 75mg  twice daily to help with concentration and focus. She states that she really has noted no improvement at all. She has been referred to psychiatry for further evaluation of adult ADHD. She has appointment tentatively scheduled for May, 2023.  She will have pap smear done today. Was unable to get this done at her appointment for physical as she had been on her menstrual cycle.  She has no new concerns or complaints today.   Medications: Outpatient Medications Prior to Visit  Medication Sig   cetirizine (ZYRTEC) 10 MG tablet Take 10 mg by mouth daily.   fluticasone (FLONASE) 50 MCG/ACT nasal spray Place 1 spray into both nostrils daily.   gabapentin (NEURONTIN) 400 MG capsule Take 400 mg by mouth 3 (three) times daily.   ibuprofen (ADVIL) 800 MG tablet Take 1 tablet (800 mg total) by mouth 3 (three) times daily.   Multiple Vitamin (MULTIVITAMIN) tablet Take 1 tablet by mouth daily.   PROZAC 10 MG capsule Take 10 mg by mouth daily.   vitamin B-12 (CYANOCOBALAMIN) 500 MCG tablet Take 500 mcg by mouth daily.   [DISCONTINUED] buPROPion (WELLBUTRIN) 75 MG tablet Take 1 tablet (75 mg total) by mouth 2 (two) times daily.   No facility-administered medications prior to visit.    Review of Systems  Constitutional:  Negative for activity change, appetite change, chills, fatigue and fever.  HENT:  Negative for congestion, postnasal drip, rhinorrhea, sinus pressure, sinus pain, sneezing and sore throat.   Eyes: Negative.   Respiratory:  Negative for cough, chest tightness, shortness of breath and wheezing.   Cardiovascular:  Negative for chest pain and palpitations.   Gastrointestinal:  Negative for abdominal pain, constipation, diarrhea, nausea and vomiting.  Endocrine: Negative for cold intolerance, heat intolerance, polydipsia and polyuria.  Genitourinary:  Negative for dyspareunia, dysuria, flank pain, frequency and urgency.  Musculoskeletal:  Negative for arthralgias, back pain and myalgias.  Skin:  Negative for rash.  Allergic/Immunologic: Negative for environmental allergies.  Neurological:  Negative for dizziness, weakness and headaches.  Hematological:  Negative for adenopathy.  Psychiatric/Behavioral:  Positive for decreased concentration and dysphoric mood. The patient is nervous/anxious.       Objective     Today's Vitals   05/15/21 1506  BP: 118/85  Pulse: 99  Temp: 97.9 F (36.6 C)  SpO2: 97%  Weight: 191 lb 6.4 oz (86.8 kg)  Height: 5\' 6"  (1.676 m)   Body mass index is 30.89 kg/m.   BP Readings from Last 3 Encounters:  05/15/21 118/85  04/18/21 120/82  04/11/21 119/80    Wt Readings from Last 3 Encounters:  05/15/21 191 lb 6.4 oz (86.8 kg)  04/18/21 194 lb 3.2 oz (88.1 kg)  04/11/21 190 lb 6.4 oz (86.4 kg)    Physical Exam Vitals and nursing note reviewed. Exam conducted with a chaperone present.  Constitutional:      Appearance: Normal appearance. She is well-developed.  HENT:     Head: Normocephalic and atraumatic.  Eyes:     Pupils: Pupils are equal, round, and reactive to light.  Cardiovascular:     Rate and Rhythm: Normal rate  and regular rhythm.     Pulses: Normal pulses.     Heart sounds: Normal heart sounds.  Pulmonary:     Effort: Pulmonary effort is normal.     Breath sounds: Normal breath sounds.  Abdominal:     Palpations: Abdomen is soft.     Hernia: There is no hernia in the left inguinal area or right inguinal area.  Genitourinary:    General: Normal vulva.     Exam position: Supine.     Labia:        Right: No rash, tenderness or lesion.        Left: No rash, tenderness or lesion.       Vagina: No signs of injury and foreign body. No vaginal discharge, erythema, tenderness, bleeding or lesions.     Cervix: No cervical motion tenderness, discharge, friability, lesion, erythema or cervical bleeding.     Uterus: Normal.      Adnexa: Right adnexa normal.     Comments: No tenderness, masses, or organomeglay present during bimanual exam .   Musculoskeletal:        General: Normal range of motion.     Cervical back: Normal range of motion and neck supple.  Lymphadenopathy:     Cervical: No cervical adenopathy.     Lower Body: No right inguinal adenopathy. No left inguinal adenopathy.  Skin:    General: Skin is warm and dry.     Capillary Refill: Capillary refill takes less than 2 seconds.  Neurological:     General: No focal deficit present.     Mental Status: She is alert and oriented to person, place, and time.  Psychiatric:        Mood and Affect: Mood normal.        Behavior: Behavior normal.        Thought Content: Thought content normal.        Judgment: Judgment normal.      Assessment & Plan    1. Adult ADHD (attention deficit hyperactivity disorder) Change wellbutrin to wellbutrin XL 150mg  daily. Encouraged her to keep initial consultation appointment for evaluation of adult ADHD - buPROPion (WELLBUTRIN XL) 150 MG 24 hr tablet; Take 1 tablet (150 mg total) by mouth daily.  Dispense: 30 tablet; Refill: 2  2. Major depressive disorder, single episode, severe (HCC) Increase wellutin to wellbutrin XL 150mg  daily. Continue regular visits with psychiatry and counseling providers. Reasess in 6 weeks.  - buPROPion (WELLBUTRIN XL) 150 MG 24 hr tablet; Take 1 tablet (150 mg total) by mouth daily.  Dispense: 30 tablet; Refill: 2  3. Encounter for screening for cervical cancer Pap smear obtained today.  - Cytology - PAP( [place])  4. Body mass index (BMI) of 30.0-30.9 in adult Discussed lowering calorie intake to 1500 calories per day and incorporating exercise  into daily routine to help lose weight.   Problem List Items Addressed This Visit       Other   Major depressive disorder, single episode, severe (HCC)   Relevant Medications   buPROPion (WELLBUTRIN XL) 150 MG 24 hr tablet   Body mass index (BMI) of 30.0-30.9 in adult   Adult ADHD (attention deficit hyperactivity disorder) - Primary   Relevant Medications   buPROPion (WELLBUTRIN XL) 150 MG 24 hr tablet   Other Visit Diagnoses     Encounter for screening for cervical cancer       Relevant Orders   Cytology - PAP( [place])  Return in about 6 weeks (around 06/26/2021) for mood - increased does wellbutrin .         Ronnell Freshwater, NP  [facility] [phone] (phone) [phone] (fax)  [place]

## 2021-05-19 LAB — CYTOLOGY - PAP
Comment: NEGATIVE
Diagnosis: NEGATIVE
High risk HPV: NEGATIVE

## 2021-05-20 NOTE — Progress Notes (Signed)
Please let the patient know that her pap smear was normal. We weill repeat this in three years.  Thanks so much.   -HB

## 2021-06-04 ENCOUNTER — Ambulatory Visit: Payer: 59 | Admitting: Nurse Practitioner

## 2021-06-26 ENCOUNTER — Ambulatory Visit: Payer: 59 | Admitting: Nurse Practitioner

## 2021-06-26 ENCOUNTER — Encounter: Payer: Self-pay | Admitting: Nurse Practitioner

## 2021-06-26 ENCOUNTER — Other Ambulatory Visit: Payer: Self-pay

## 2021-06-26 VITALS — BP 95/62 | HR 72 | Temp 98.0°F | Ht 66.0 in | Wt 187.6 lb

## 2021-06-26 DIAGNOSIS — F322 Major depressive disorder, single episode, severe without psychotic features: Secondary | ICD-10-CM

## 2021-06-26 DIAGNOSIS — F909 Attention-deficit hyperactivity disorder, unspecified type: Secondary | ICD-10-CM

## 2021-06-26 DIAGNOSIS — Z6830 Body mass index (BMI) 30.0-30.9, adult: Secondary | ICD-10-CM

## 2021-06-26 MED ORDER — BUPROPION HCL ER (XL) 300 MG PO TB24: 300.0000 mg | ORAL_TABLET | Freq: Every day | ORAL | 2 refills | Status: DC

## 2021-06-26 NOTE — Progress Notes (Signed)
Established patient visit ? ? ?Patient: Sylvia Maxwell   DOB: 1986-01-04   36 y.o. Female  MRN: [id number] ?Visit Date: 06/26/2021 ? ? ?Chief Complaint  ?Patient presents with  ? Follow-up  ? ?Subjective  ?  ?HPI  ?The patient is here for routine follow up  ?-has been able to lose 7 pounds since January.  ?-changed wellbutrin to XR at 150 mg - states that she hasn't noted much of a difference. Does have referral to [facility]. First appointment not for several months . ?-continues to see outpatient psychiatry and counseling services on routine visits.  ?-no new concerns or complaints today ? ? ?Medications: ?Outpatient Medications Prior to Visit  ?Medication Sig  ? ARIPiprazole (ABILIFY) 5 MG tablet Take 5 mg by mouth daily.  ? cetirizine (ZYRTEC) 10 MG tablet Take 10 mg by mouth daily.  ? fluticasone (FLONASE) 50 MCG/ACT nasal spray Place 1 spray into both nostrils daily.  ? gabapentin (NEURONTIN) 400 MG capsule Take 400 mg by mouth 3 (three) times daily.  ? ibuprofen (ADVIL) 800 MG tablet Take 1 tablet (800 mg total) by mouth 3 (three) times daily.  ? metFORMIN (GLUCOPHAGE) 500 MG tablet Take 500 mg by mouth at bedtime.  ? Multiple Vitamin (MULTIVITAMIN) tablet Take 1 tablet by mouth daily.  ? propranolol (INDERAL) 40 MG tablet Take 40 mg by mouth 2 (two) times daily.  ? PROZAC 10 MG capsule Take 10 mg by mouth daily.  ? vitamin B-12 (CYANOCOBALAMIN) 500 MCG tablet Take 500 mcg by mouth daily.  ? [DISCONTINUED] buPROPion (WELLBUTRIN XL) 150 MG 24 hr tablet Take 1 tablet (150 mg total) by mouth daily.  ? ?No facility-administered medications prior to visit.  ? ? ?Review of Systems  ?Constitutional:  Negative for activity change, appetite change, chills, fatigue and fever.  ?     Seven pound weight loss since most recent visit   ?HENT:  Negative for congestion, postnasal drip, rhinorrhea, sinus pressure, sinus pain, sneezing and sore throat.   ?Eyes: Negative.   ?Respiratory:  Negative for cough,  chest tightness, shortness of breath and wheezing.   ?Cardiovascular:  Negative for chest pain and palpitations.  ?Gastrointestinal:  Negative for abdominal pain, constipation, diarrhea, nausea and vomiting.  ?Endocrine: Negative for cold intolerance, heat intolerance, polydipsia and polyuria.  ?Genitourinary:  Negative for dyspareunia, dysuria, flank pain, frequency and urgency.  ?Musculoskeletal:  Negative for arthralgias, back pain and myalgias.  ?Skin:  Negative for rash.  ?Allergic/Immunologic: Negative for environmental allergies.  ?Neurological:  Negative for dizziness, weakness and headaches.  ?Hematological:  Negative for adenopathy.  ?Psychiatric/Behavioral:  Positive for decreased concentration and dysphoric mood. The patient is nervous/anxious.   ? ? ? ? Objective  ?  ? ?Today's Vitals  ? 06/26/21 0935  ?BP: 95/62  ?Pulse: 72  ?Temp: 98 ?F (36.7 ?C)  ?SpO2: 97%  ?Weight: 187 lb 9.6 oz (85.1 kg)  ?Height: '5\' 6"'$  (1.676 m)  ? ?Body mass index is 30.28 kg/m?.  ? ?BP Readings from Last 3 Encounters:  ?06/26/21 95/62  ?05/15/21 118/85  ?04/18/21 120/82  ?  ?Wt Readings from Last 3 Encounters:  ?06/26/21 187 lb 9.6 oz (85.1 kg)  ?05/15/21 191 lb 6.4 oz (86.8 kg)  ?04/18/21 194 lb 3.2 oz (88.1 kg)  ?  ?Physical Exam ?Vitals and nursing note reviewed.  ?Constitutional:   ?   Appearance: Normal appearance. She is well-developed. She is obese.  ?HENT:  ?   Head: Normocephalic and atraumatic.  ?  Nose: Nose normal.  ?   Mouth/Throat:  ?   Mouth: Mucous membranes are moist.  ?   Pharynx: Oropharynx is clear.  ?Eyes:  ?   Extraocular Movements: Extraocular movements intact.  ?   Conjunctiva/sclera: Conjunctivae normal.  ?   Pupils: Pupils are equal, round, and reactive to light.  ?Cardiovascular:  ?   Rate and Rhythm: Normal rate and regular rhythm.  ?   Pulses: Normal pulses.  ?   Heart sounds: Normal heart sounds.  ?Pulmonary:  ?   Effort: Pulmonary effort is normal.  ?   Breath sounds: Normal breath sounds.   ?Abdominal:  ?   Palpations: Abdomen is soft.  ?Musculoskeletal:     ?   General: Normal range of motion.  ?   Cervical back: Normal range of motion and neck supple.  ?Lymphadenopathy:  ?   Cervical: No cervical adenopathy.  ?Skin: ?   General: Skin is warm and dry.  ?   Capillary Refill: Capillary refill takes less than 2 seconds.  ?Neurological:  ?   General: No focal deficit present.  ?   Mental Status: She is alert and oriented to person, place, and time.  ?Psychiatric:     ?   Mood and Affect: Mood normal.     ?   Behavior: Behavior normal.     ?   Thought Content: Thought content normal.     ?   Judgment: Judgment normal.  ?  ? ? Assessment & Plan  ?  ?1. Adult ADHD (attention deficit hyperactivity disorder) ?Increase wellbutrin XL to '300mg'$  daily.  ?- buPROPion (WELLBUTRIN XL) 300 MG 24 hr tablet; Take 1 tablet (300 mg total) by mouth daily.  Dispense: 30 tablet; Refill: 2 ? ?2. Major depressive disorder, single episode, severe ([place]) ?Increase wellbutrin XL to 300 mg daily.  Continue regular visits with psychiatry and counseling services as scheduled.  ?- buPROPion (WELLBUTRIN XL) 300 MG 24 hr tablet; Take 1 tablet (300 mg total) by mouth daily.  Dispense: 30 tablet; Refill: 2 ? ?3. Body mass index (BMI) of 30.0-30.9 in adult ?Improving with a seven pound weight loss since most recent visit. Discussed lowering calorie intake to 1500 calories per day and incorporating exercise into daily routine to help lose weight.  ? ? ?Problem List Items Addressed This Visit   ? ?  ? Other  ? Major depressive disorder, single episode, severe ([place])  ? Relevant Medications  ? buPROPion (WELLBUTRIN XL) 300 MG 24 hr tablet  ? Body mass index (BMI) of 30.0-30.9 in adult  ? Adult ADHD (attention deficit hyperactivity disorder) - Primary  ? Relevant Medications  ? buPROPion (WELLBUTRIN XL) 300 MG 24 hr tablet  ?  ? ?Return in about 3 months (around 09/26/2021) for mood. increaed wellbutrin .  ?   ? ? ? ? ?Ronnell Freshwater, NP   ?[facility] ?[phone] (phone) ?[phone] (fax) ? ?[facility]  ?

## 2021-07-06 NOTE — Patient Instructions (Signed)
Fat and Cholesterol Restricted Eating Plan Getting too much fat and cholesterol in your diet may cause health problems. Choosing the right foods helps keep your fat and cholesterol at normal levels. This can keep you from getting certain diseases. Your doctor may recommend an eating plan that includes: Total fat: ______% or less of total calories a day. This is ______g of fat a day. Saturated fat: ______% or less of total calories a day. This is ______g of saturated fat a day. Cholesterol: less than _________mg a day. Fiber: ______g a day. What are tips for following this plan? General tips Work with your doctor to lose weight if you need to. Avoid: Foods with added sugar. Fried foods. Foods with trans fat or partially hydrogenated oils. This includes some margarines and baked goods. If you drink alcohol: Limit how much you have to: 0-1 drink a day for women who are not pregnant. 0-2 drinks a day for men. Know how much alcohol is in a drink. In the [place] one drink equals one 12 oz bottle of beer (355 mL), one 5 oz glass of wine (148 mL), or one 1 oz glass of hard liquor (44 mL). Reading food labels Check food labels for: Trans fats. Partially hydrogenated oils. Saturated fat (g) in each serving. Cholesterol (mg) in each serving. Fiber (g) in each serving. Choose foods with healthy fats, such as: Monounsaturated fats and polyunsaturated fats. These include olive and canola oil, flaxseeds, walnuts, almonds, and seeds. Omega-3 fats. These are found in certain fish, flaxseed oil, and ground flaxseeds. Choose grain products that have whole grains. Look for the word "whole" as the first word in the ingredient list. Cooking Cook foods using low-fat methods. These include baking, boiling, grilling, and broiling. Eat more home-cooked foods. Eat at restaurants and buffets less often. Eat less fast food. Avoid cooking using saturated fats, such as butter, cream, palm oil, palm kernel oil, and  coconut oil. Meal planning  At meals, divide your plate into four equal parts: Fill one-half of your plate with vegetables, green salads, and fruit. Fill one-fourth of your plate with whole grains. Fill one-fourth of your plate with low-fat (lean) protein foods. Eat fish that is high in omega-3 fats at least two times a week. This includes mackerel, tuna, sardines, and salmon. Eat foods that are high in fiber, such as whole grains, beans, apples, pears, berries, broccoli, carrots, peas, and barley. What foods should I eat? Fruits All fresh, canned (in natural juice), or frozen fruits. Vegetables Fresh or frozen vegetables (raw, steamed, roasted, or grilled). Green salads. Grains Whole grains, such as whole wheat or whole grain breads, crackers, cereals, and pasta. Unsweetened oatmeal, bulgur, barley, quinoa, or brown rice. Corn or whole wheat flour tortillas. Meats and other protein foods Ground beef (85% or leaner), grass-fed beef, or beef trimmed of fat. Skinless chicken or turkey. Ground chicken or turkey. Pork trimmed of fat. All fish and seafood. Egg whites. Dried beans, peas, or lentils. Unsalted nuts or seeds. Unsalted canned beans. Nut butters without added sugar or oil. Dairy Low-fat or nonfat dairy products, such as skim or 1% milk, 2% or reduced-fat cheeses, low-fat and fat-free ricotta or cottage cheese, or plain low-fat and nonfat yogurt. Fats and oils Tub margarine without trans fats. Light or reduced-fat mayonnaise and salad dressings. Avocado. Olive, canola, sesame, or safflower oils. The items listed above may not be a complete list of foods and beverages you can eat. Contact a dietitian for more information. What foods   should I avoid? Fruits Canned fruit in heavy syrup. Fruit in cream or butter sauce. Fried fruit. Vegetables Vegetables cooked in cheese, cream, or butter sauce. Fried vegetables. Grains White bread. White pasta. White rice. Cornbread. Bagels, pastries,  and croissants. Crackers and snack foods that contain trans fat and hydrogenated oils. Meats and other protein foods Fatty cuts of meat. Ribs, chicken wings, bacon, sausage, bologna, salami, chitterlings, fatback, hot dogs, bratwurst, and packaged lunch meats. Liver and organ meats. Whole eggs and egg yolks. Chicken and turkey with skin. Fried meat. Dairy Whole or 2% milk, cream, half-and-half, and cream cheese. Whole milk cheeses. Whole-fat or sweetened yogurt. Full-fat cheeses. Nondairy creamers and whipped toppings. Processed cheese, cheese spreads, and cheese curds. Fats and oils Butter, stick margarine, lard, shortening, ghee, or bacon fat. Coconut, palm kernel, and palm oils. Beverages Alcohol. Sugar-sweetened drinks such as sodas, lemonade, and fruit drinks. Sweets and desserts Corn syrup, sugars, honey, and molasses. Candy. Jam and jelly. Syrup. Sweetened cereals. Cookies, pies, cakes, donuts, muffins, and ice cream. The items listed above may not be a complete list of foods and beverages you should avoid. Contact a dietitian for more information. Summary Choosing the right foods helps keep your fat and cholesterol at normal levels. This can keep you from getting certain diseases. At meals, fill one-half of your plate with vegetables, green salads, and fruits. Eat high fiber foods, like whole grains, beans, apples, pears, berries, carrots, peas, and barley. Limit added sugar, saturated fats, alcohol, and fried foods. This information is not intended to replace advice given to you by your health care provider. Make sure you discuss any questions you have with your health care provider. Document Revised: 08/09/2020 Document Reviewed: 08/09/2020 Elsevier Patient Education  2022 [employer]  
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
## 2021-08-13 ENCOUNTER — Ambulatory Visit (INDEPENDENT_AMBULATORY_CARE_PROVIDER_SITE_OTHER): Payer: 59 | Admitting: Gastroenterology

## 2021-08-13 ENCOUNTER — Encounter: Payer: Self-pay | Admitting: Gastroenterology

## 2021-08-13 VITALS — BP 125/88 | HR 88 | Temp 98.4°F | Ht 66.0 in | Wt 185.0 lb

## 2021-08-13 DIAGNOSIS — B182 Chronic viral hepatitis C: Secondary | ICD-10-CM

## 2021-08-14 LAB — SPECIMEN STATUS REPORT

## 2021-08-15 LAB — HCV FIBROSURE
ALPHA 2-MACROGLOBULINS, QN: 129 mg/dL (ref 110–276)
ALT (SGPT) P5P: 120 IU/L — ABNORMAL HIGH (ref 0–40)
Apolipoprotein A-1: 244 mg/dL — ABNORMAL HIGH (ref 116–209)
Bilirubin, Total: 0.2 mg/dL (ref 0.0–1.2)
Fibrosis Score: 0.02 (ref 0.00–0.21)
GGT: 188 IU/L — ABNORMAL HIGH (ref 0–60)
Haptoglobin: 200 mg/dL (ref 33–278)
Necroinflammat Activity Score: 0.54 — ABNORMAL HIGH (ref 0.00–0.17)

## 2021-08-15 LAB — CERULOPLASMIN: Ceruloplasmin: 25.2 mg/dL (ref 19.0–39.0)

## 2021-08-15 LAB — HEPATITIS A ANTIBODY, TOTAL: hep A Total Ab: NEGATIVE

## 2021-08-15 LAB — IRON,TIBC AND FERRITIN PANEL
Ferritin: 500 ng/mL — ABNORMAL HIGH (ref 15–150)
Iron Saturation: 25 % (ref 15–55)
Iron: 96 ug/dL (ref 27–159)
Total Iron Binding Capacity: 378 ug/dL (ref 250–450)
UIBC: 282 ug/dL (ref 131–425)

## 2021-08-15 LAB — HEPATIC FUNCTION PANEL
ALT: 103 IU/L — ABNORMAL HIGH (ref 0–32)
AST: 84 IU/L — ABNORMAL HIGH (ref 0–40)
Albumin: 4.8 g/dL (ref 3.8–4.8)
Alkaline Phosphatase: 83 IU/L (ref 44–121)
Bilirubin Total: 0.5 mg/dL (ref 0.0–1.2)
Bilirubin, Direct: 0.21 mg/dL (ref 0.00–0.40)
Total Protein: 7.9 g/dL (ref 6.0–8.5)

## 2021-08-15 LAB — HEPATITIS B SURFACE ANTIBODY,QUALITATIVE: Hep B Surface Ab, Qual: NONREACTIVE

## 2021-08-15 LAB — HEPATITIS B SURFACE ANTIGEN: Hepatitis B Surface Ag: NEGATIVE

## 2021-08-15 LAB — MITOCHONDRIAL ANTIBODIES: Mitochondrial Ab: <20 Units (ref 0.0–20.0)

## 2021-08-15 LAB — ANTI-SMOOTH MUSCLE ANTIBODY, IGG: Smooth Muscle Ab: 10 Units (ref 0–19)

## 2021-08-15 LAB — HCV RNA QUANT: Hepatitis C Quantitation: NOT DETECTED IU/mL

## 2021-08-15 LAB — ALPHA-1-ANTITRYPSIN: A-1 Antitrypsin: 143 mg/dL (ref 100–188)

## 2021-08-15 LAB — ANA: Anti Nuclear Antibody (ANA): NEGATIVE

## 2021-08-20 NOTE — Addendum Note (Signed)
Addended by: Lurlean Nanny on: 08/20/2021 03:59 PM ? ? Modules accepted: Orders ? ?

## 2021-10-05 NOTE — Progress Notes (Deleted)
Established patient visit   Patient: Sylvia Maxwell   DOB: 09/02/1985   36 y.o. Female  MRN: [id number] Visit Date: 10/06/2021   No chief complaint on file.  Subjective    HPI  Follow up visit.  -increased dose Wellbutrin XL tp 300 mg to improve symptoms of ADHD and GAD.  -continues to see outpatient therapist and psychiatrist.  -unsure if she has had appointment with attention specialist.  -now seeing GI due to chronic hepatitis C.    Medications: Outpatient Medications Prior to Visit  Medication Sig   ARIPiprazole (ABILIFY) 5 MG tablet Take 5 mg by mouth daily.   buPROPion (WELLBUTRIN XL) 300 MG 24 hr tablet Take 1 tablet (300 mg total) by mouth daily.   cetirizine (ZYRTEC) 10 MG tablet Take 10 mg by mouth daily.   fluticasone (FLONASE) 50 MCG/ACT nasal spray Place 1 spray into both nostrils daily.   gabapentin (NEURONTIN) 400 MG capsule Take 400 mg by mouth 3 (three) times daily.   metFORMIN (GLUCOPHAGE) 500 MG tablet Take 500 mg by mouth at bedtime.   Multiple Vitamin (MULTIVITAMIN) tablet Take 1 tablet by mouth daily.   propranolol (INDERAL) 40 MG tablet Take 40 mg by mouth 2 (two) times daily.   PROZAC 10 MG capsule Take 10 mg by mouth daily.   vitamin B-12 (CYANOCOBALAMIN) 500 MCG tablet Take 500 mcg by mouth daily.   No facility-administered medications prior to visit.    Review of Systems  {Labs (Optional):23779}   Objective    There were no vitals taken for this visit. BP Readings from Last 3 Encounters:  08/13/21 125/88  06/26/21 95/62  05/15/21 118/85    Wt Readings from Last 3 Encounters:  08/13/21 185 lb (83.9 kg)  06/26/21 187 lb 9.6 oz (85.1 kg)  05/15/21 191 lb 6.4 oz (86.8 kg)    Physical Exam  ***  No results found for any visits on 10/06/21.  Assessment & Plan     Problem List Items Addressed This Visit   None    No follow-ups on file.         Ronnell Freshwater, NP  [facility] at [facility] [phone]  (phone) [phone] (fax)  [place]

## 2021-10-06 ENCOUNTER — Ambulatory Visit: Payer: 59 | Admitting: Nurse Practitioner

## 2021-10-27 ENCOUNTER — Encounter: Payer: Self-pay | Admitting: Gastroenterology

## 2021-12-24 ENCOUNTER — Encounter: Payer: Self-pay | Admitting: Gastroenterology

## 2021-12-24 DIAGNOSIS — B182 Chronic viral hepatitis C: Secondary | ICD-10-CM

## 2021-12-31 ENCOUNTER — Ambulatory Visit (INDEPENDENT_AMBULATORY_CARE_PROVIDER_SITE_OTHER): Payer: 59 | Admitting: Nurse Practitioner

## 2021-12-31 ENCOUNTER — Encounter: Payer: Self-pay | Admitting: Nurse Practitioner

## 2021-12-31 VITALS — BP 100/66 | HR 64 | Ht 66.0 in | Wt 169.1 lb

## 2021-12-31 DIAGNOSIS — F322 Major depressive disorder, single episode, severe without psychotic features: Secondary | ICD-10-CM

## 2021-12-31 DIAGNOSIS — R233 Spontaneous ecchymoses: Secondary | ICD-10-CM

## 2021-12-31 DIAGNOSIS — F909 Attention-deficit hyperactivity disorder, unspecified type: Secondary | ICD-10-CM | POA: Diagnosis not present

## 2021-12-31 DIAGNOSIS — K732 Chronic active hepatitis, not elsewhere classified: Secondary | ICD-10-CM | POA: Diagnosis not present

## 2021-12-31 DIAGNOSIS — R7989 Other specified abnormal findings of blood chemistry: Secondary | ICD-10-CM

## 2021-12-31 MED ORDER — BUPROPION HCL ER (XL) 300 MG PO TB24: 300.0000 mg | ORAL_TABLET | Freq: Every day | ORAL | 2 refills | Status: DC

## 2021-12-31 NOTE — Progress Notes (Unsigned)
Established patient visit   Patient: Sylvia Maxwell   DOB: 1986/01/06   36 y.o. Female  MRN: [id number] Visit Date: 12/31/2021   Chief Complaint  Patient presents with   Follow-up   Subjective    HPI  Routine follow up. -has weaned off of prozac and abilify. This was in conjunction with psychiatrist.  --doing well with wellbutrin alone -hading evening headaches which are frontal  --does use flonase and takes allegra daily.  --still has itchy and watery eyes. Has nasal congestion.  -did hae moderate to severe bruising on both legs. This has resolved on it's own  -last checked cbc showed normal platelet count, however, ferritin moderately elevated. -she does have chronic hepatitis C and elevated liver functions. She is having further evaluation per GI/hepatology.   Medications: Outpatient Medications Prior to Visit  Medication Sig   cetirizine (ZYRTEC) 10 MG tablet Take 10 mg by mouth daily.   fluticasone (FLONASE) 50 MCG/ACT nasal spray Place 1 spray into both nostrils daily.   gabapentin (NEURONTIN) 400 MG capsule Take 400 mg by mouth 3 (three) times daily.   metFORMIN (GLUCOPHAGE) 500 MG tablet Take 500 mg by mouth at bedtime.   Multiple Vitamin (MULTIVITAMIN) tablet Take 1 tablet by mouth daily.   PROZAC 10 MG capsule Take 10 mg by mouth daily.   vitamin B-12 (CYANOCOBALAMIN) 500 MCG tablet Take 500 mcg by mouth daily.   [DISCONTINUED] buPROPion (WELLBUTRIN XL) 300 MG 24 hr tablet Take 1 tablet (300 mg total) by mouth daily.   [DISCONTINUED] ARIPiprazole (ABILIFY) 5 MG tablet Take 5 mg by mouth daily.   [DISCONTINUED] propranolol (INDERAL) 40 MG tablet Take 40 mg by mouth 2 (two) times daily.   No facility-administered medications prior to visit.    Review of Systems  Constitutional:  Negative for activity change, appetite change, chills, fatigue and fever.  HENT:  Positive for congestion and postnasal drip. Negative for rhinorrhea, sinus pressure, sinus pain, sneezing and  sore throat.   Eyes:  Positive for redness and itching.  Respiratory:  Negative for cough, chest tightness, shortness of breath and wheezing.   Cardiovascular:  Negative for chest pain and palpitations.  Gastrointestinal:  Negative for abdominal pain, constipation, diarrhea, nausea and vomiting.  Endocrine: Negative for cold intolerance, heat intolerance, polydipsia and polyuria.  Genitourinary:  Negative for dyspareunia, dysuria, flank pain, frequency and urgency.  Musculoskeletal:  Negative for arthralgias, back pain and myalgias.  Skin:  Negative for rash.  Allergic/Immunologic: Positive for environmental allergies.  Neurological:  Positive for headaches. Negative for dizziness and weakness.  Hematological:  Negative for adenopathy. Bruises/bleeds easily.  Psychiatric/Behavioral:  Positive for decreased concentration. The patient is nervous/anxious.     Last CBC Lab Results  Component Value Date   WBC 3.7 03/24/2021   HGB 15.2 03/24/2021   HCT 46.4 03/24/2021   MCV 98 (H) 03/24/2021   MCH 31.9 03/24/2021   RDW 12.3 03/24/2021   PLT 195 14/97/0263   Last metabolic panel Lab Results  Component Value Date   GLUCOSE 115 (H) 03/24/2021   NA 142 03/24/2021   K 4.3 03/24/2021   CL 101 03/24/2021   CO2 22 03/24/2021   BUN 12 03/24/2021   CREATININE 0.62 03/24/2021   EGFR 118 03/24/2021   CALCIUM 9.4 03/24/2021   PHOS 2.6 05/21/2017   PROT 7.9 08/13/2021   ALBUMIN 4.8 08/13/2021   LABGLOB 2.8 03/24/2021   AGRATIO 1.6 03/24/2021   BILITOT 0.5 08/13/2021   ALKPHOS 83 08/13/2021  AST 84 (H) 08/13/2021   ALT 103 (H) 08/13/2021   ANIONGAP 13 09/03/2019   Last lipids Lab Results  Component Value Date   CHOL 147 03/24/2021   HDL 94 03/24/2021   LDLCALC 27 03/24/2021   TRIG 170 (H) 03/24/2021   CHOLHDL 1.6 03/24/2021   Last hemoglobin A1c Lab Results  Component Value Date   HGBA1C 5.3 03/24/2021   Last thyroid functions Lab Results  Component Value Date   TSH  1.110 03/24/2021   Last vitamin D Lab Results  Component Value Date   VD25OH 95.2 03/24/2021      Objective     Today's Vitals   12/31/21 1440  BP: 100/66  Pulse: 64  SpO2: 97%  Weight: 169 lb 1.9 oz (76.7 kg)  Height: '5\' 6"'  (1.676 m)   Body mass index is 27.3 kg/m.   BP Readings from Last 3 Encounters:  12/31/21 100/66  08/13/21 125/88  06/26/21 95/62    Wt Readings from Last 3 Encounters:  12/31/21 169 lb 1.9 oz (76.7 kg)  08/13/21 185 lb (83.9 kg)  06/26/21 187 lb 9.6 oz (85.1 kg)    Physical Exam Vitals and nursing note reviewed.  Constitutional:      Appearance: Normal appearance. She is well-developed.  HENT:     Head: Normocephalic and atraumatic.     Nose: Congestion present.     Mouth/Throat:     Mouth: Mucous membranes are moist.  Eyes:     Extraocular Movements: Extraocular movements intact.     Conjunctiva/sclera: Conjunctivae normal.     Pupils: Pupils are equal, round, and reactive to light.  Cardiovascular:     Rate and Rhythm: Normal rate and regular rhythm.     Pulses: Normal pulses.     Heart sounds: Normal heart sounds.  Pulmonary:     Effort: Pulmonary effort is normal.     Breath sounds: Normal breath sounds.  Abdominal:     Palpations: Abdomen is soft.  Musculoskeletal:        General: Normal range of motion.     Cervical back: Normal range of motion and neck supple.  Lymphadenopathy:     Cervical: No cervical adenopathy.  Skin:    General: Skin is warm and dry.     Capillary Refill: Capillary refill takes less than 2 seconds.  Neurological:     General: No focal deficit present.     Mental Status: She is alert and oriented to person, place, and time.  Psychiatric:        Mood and Affect: Mood normal.        Behavior: Behavior normal.        Thought Content: Thought content normal.        Judgment: Judgment normal.       Assessment & Plan    1. Easy bruising Patient reporting excess and severe bruising, especially on  the legs. Last CBC done 08/2021 with normal platelets. Will check CBC and seru ferritin.   2. High serum ferritin Check CBC and recheck serum ferritin - CBC - Ferritin  3. Chronic active hepatitis [facility]) May be contributing new, easy bruising. Will check cbc, ferritin, and cmp. She should continue follow up with GI/hepatology  - CBC - Comp Met (CMET) - Ferritin  4. Abnormal liver function test Check CMP. She should continue regular follow up with GI/hepatology  - CBC - Comp Met (CMET) - Ferritin  5. dult ADHD (attention deficit hyperactivity disorder) Stable. Continue wellbutrin XL  300 mg daily  - buPROPion (WELLBUTRIN XL) 300 MG 24 hr tablet; Take 1 tablet (300 mg total) by mouth daily.  Dispense: 30 tablet; Refill: 2  6. Major depressive disorder, single episode, severe (HCC) Stable. Continue wellbutrin XL 300 mg daily. Contiue regular visits with therapist as scheduled.  - buPROPion (WELLBUTRIN XL) 300 MG 24 hr tablet; Take 1 tablet (300 mg total) by mouth daily.  Dispense: 30 tablet; Refill: 2    Problem List Items Addressed This Visit       Digestive   Chronic active hepatitis ([place])   Relevant Orders   CBC   Comp Met (CMET)   Ferritin     Other   Major depressive disorder, single episode, severe (HCC)   Relevant Medications   buPROPion (WELLBUTRIN XL) 300 MG 24 hr tablet   Adult ADHD (attention deficit hyperactivity disorder)   Relevant Medications   buPROPion (WELLBUTRIN XL) 300 MG 24 hr tablet   Easy bruising - Primary   High serum ferritin   Relevant Orders   CBC   Ferritin   Abnormal liver function test   Relevant Orders   CBC   Comp Met (CMET)   Ferritin     Return in about 3 months (around 04/01/2022) for health maintenance exam, FBW a week prior to visit - needs CBC and cmp on friday morning .         Ronnell Freshwater, NP  [facility] at [facility] [phone] (phone) [phone] (fax)  [place]

## 2022-01-01 DIAGNOSIS — R233 Spontaneous ecchymoses: Secondary | ICD-10-CM | POA: Insufficient documentation

## 2022-01-01 DIAGNOSIS — R7989 Other specified abnormal findings of blood chemistry: Secondary | ICD-10-CM | POA: Insufficient documentation

## 2022-01-02 ENCOUNTER — Other Ambulatory Visit: Payer: 59

## 2022-01-02 DIAGNOSIS — K732 Chronic active hepatitis, not elsewhere classified: Secondary | ICD-10-CM

## 2022-01-02 DIAGNOSIS — R7989 Other specified abnormal findings of blood chemistry: Secondary | ICD-10-CM

## 2022-01-03 LAB — CBC
Hematocrit: 42.7 % (ref 34.0–46.6)
Hemoglobin: 14.2 g/dL (ref 11.1–15.9)
MCH: 33.2 pg — ABNORMAL HIGH (ref 26.6–33.0)
MCHC: 33.3 g/dL (ref 31.5–35.7)
MCV: 100 fL — ABNORMAL HIGH (ref 79–97)
Platelets: 200 10*3/uL (ref 150–450)
RBC: 4.28 x10E6/uL (ref 3.77–5.28)
RDW: 12.4 % (ref 11.7–15.4)
WBC: 5.8 10*3/uL (ref 3.4–10.8)

## 2022-01-03 LAB — COMPREHENSIVE METABOLIC PANEL
ALT: 34 IU/L — ABNORMAL HIGH (ref 0–32)
AST: 33 IU/L (ref 0–40)
Albumin/Globulin Ratio: 1.9 (ref 1.2–2.2)
Albumin: 4.6 g/dL (ref 3.9–4.9)
Alkaline Phosphatase: 75 IU/L (ref 44–121)
BUN/Creatinine Ratio: 27 — ABNORMAL HIGH (ref 9–23)
BUN: 15 mg/dL (ref 6–20)
Bilirubin Total: 0.3 mg/dL (ref 0.0–1.2)
CO2: 22 mmol/L (ref 20–29)
Calcium: 9.9 mg/dL (ref 8.7–10.2)
Chloride: 100 mmol/L (ref 96–106)
Creatinine, Ser: 0.56 mg/dL — ABNORMAL LOW (ref 0.57–1.00)
Globulin, Total: 2.4 g/dL (ref 1.5–4.5)
Glucose: 83 mg/dL (ref 70–99)
Potassium: 4.8 mmol/L (ref 3.5–5.2)
Sodium: 139 mmol/L (ref 134–144)
Total Protein: 7 g/dL (ref 6.0–8.5)
eGFR: 120 mL/min/{1.73_m2} (ref >59)

## 2022-01-03 LAB — FERRITIN: Ferritin: 377 ng/mL — ABNORMAL HIGH (ref 15–150)

## 2022-01-04 NOTE — Progress Notes (Signed)
Please let the patient know that  her labs are  back. Ferritin level and liver functions show moderate improvement. Blood count is stable. We will continue to monitor.  Thanks  -HB

## 2022-01-06 ENCOUNTER — Telehealth: Payer: Self-pay

## 2022-01-06 NOTE — Telephone Encounter (Signed)
-----   Message from Ronnell Freshwater, NP sent at 01/04/2022  6:02 PM EDT ----- Please let the patient know that  her labs are  back. Ferritin level and liver functions show moderate improvement. Blood count is stable. We will continue to monitor.  Thanks  -HB

## 2022-01-06 NOTE — Telephone Encounter (Signed)
Called pt LVM to contact office  Seen by pt in mychart

## 2022-01-08 NOTE — Addendum Note (Signed)
Addended by: Lurlean Nanny on: 01/08/2022 11:50 AM   Modules accepted: Orders

## 2022-01-15 ENCOUNTER — Ambulatory Visit (INDEPENDENT_AMBULATORY_CARE_PROVIDER_SITE_OTHER): Payer: 59 | Admitting: Gastroenterology

## 2022-01-15 DIAGNOSIS — Z23 Encounter for immunization: Secondary | ICD-10-CM

## 2022-01-15 NOTE — Progress Notes (Signed)
Per orders of Dr. Allen Norris, injection 1 of 3 Hepatitis A/B given in right deltoid by Lurlean Nanny.  Patient tolerated injection well.

## 2022-01-18 LAB — HEPATITIS C GENOTYPE

## 2022-01-18 LAB — HCV RNA QUANT: Hepatitis C Quantitation: NOT DETECTED IU/mL

## 2022-01-20 ENCOUNTER — Emergency Department (HOSPITAL_COMMUNITY): Admission: EM | Admit: 2022-01-20 | Discharge: 2022-01-20 | Discharge status: Against Medical Advice | Payer: 59

## 2022-01-20 ENCOUNTER — Emergency Department (HOSPITAL_COMMUNITY): Payer: 59

## 2022-01-20 ENCOUNTER — Emergency Department (HOSPITAL_COMMUNITY)
Admission: EM | Admit: 2022-01-20 | Discharge: 2022-01-20 | Disposition: A | Discharge status: Home / Self Care | Payer: 59 | Attending: Emergency Medicine | Admitting: Emergency Medicine

## 2022-01-20 ENCOUNTER — Encounter (HOSPITAL_COMMUNITY): Payer: Self-pay

## 2022-01-20 DIAGNOSIS — Y907 Blood alcohol level of 200-239 mg/100 ml: Secondary | ICD-10-CM | POA: Diagnosis not present

## 2022-01-20 DIAGNOSIS — G43809 Other migraine, not intractable, without status migrainosus: Secondary | ICD-10-CM | POA: Insufficient documentation

## 2022-01-20 DIAGNOSIS — R000 Tachycardia, unspecified: Secondary | ICD-10-CM | POA: Insufficient documentation

## 2022-01-20 DIAGNOSIS — Z7984 Long term (current) use of oral hypoglycemic drugs: Secondary | ICD-10-CM | POA: Diagnosis not present

## 2022-01-20 DIAGNOSIS — Z87891 Personal history of nicotine dependence: Secondary | ICD-10-CM | POA: Diagnosis not present

## 2022-01-20 DIAGNOSIS — R519 Headache, unspecified: Secondary | ICD-10-CM | POA: Diagnosis present

## 2022-01-20 DIAGNOSIS — R200 Anesthesia of skin: Secondary | ICD-10-CM | POA: Diagnosis not present

## 2022-01-20 DIAGNOSIS — R780 Finding of alcohol in blood: Secondary | ICD-10-CM | POA: Diagnosis not present

## 2022-01-20 DIAGNOSIS — R825 Elevated urine levels of drugs, medicaments and biological substances: Secondary | ICD-10-CM | POA: Insufficient documentation

## 2022-01-20 LAB — COMPREHENSIVE METABOLIC PANEL
ALT: 40 U/L (ref 0–44)
AST: 38 U/L (ref 15–41)
Albumin: 4.4 g/dL (ref 3.5–5.0)
Alkaline Phosphatase: 60 U/L (ref 38–126)
Anion gap: 12 (ref 5–15)
BUN: 8 mg/dL (ref 6–20)
CO2: 28 mmol/L (ref 22–32)
Calcium: 8.9 mg/dL (ref 8.9–10.3)
Chloride: 100 mmol/L (ref 98–111)
Creatinine, Ser: 0.41 mg/dL — ABNORMAL LOW (ref 0.44–1.00)
GFR, Estimated: >60 mL/min (ref >60)
Glucose, Bld: 96 mg/dL (ref 70–99)
Potassium: 3.9 mmol/L (ref 3.5–5.1)
Sodium: 140 mmol/L (ref 135–145)
Total Bilirubin: 0.6 mg/dL (ref 0.3–1.2)
Total Protein: 7.6 g/dL (ref 6.5–8.1)

## 2022-01-20 LAB — CBC WITH DIFFERENTIAL/PLATELET
Abs Immature Granulocytes: 0.02 10*3/uL (ref 0.00–0.07)
Basophils Absolute: 0 10*3/uL (ref 0.0–0.1)
Basophils Relative: 0 %
Eosinophils Absolute: 0.1 10*3/uL (ref 0.0–0.5)
Eosinophils Relative: 1 %
HCT: 43.6 % (ref 36.0–46.0)
Hemoglobin: 14.8 g/dL (ref 12.0–15.0)
Immature Granulocytes: 0 %
Lymphocytes Relative: 53 %
Lymphs Abs: 2.7 10*3/uL (ref 0.7–4.0)
MCH: 32.6 pg (ref 26.0–34.0)
MCHC: 33.9 g/dL (ref 30.0–36.0)
MCV: 96 fL (ref 80.0–100.0)
Monocytes Absolute: 0.3 10*3/uL (ref 0.1–1.0)
Monocytes Relative: 6 %
Neutro Abs: 2 10*3/uL (ref 1.7–7.7)
Neutrophils Relative %: 40 %
Platelets: 175 10*3/uL (ref 150–400)
RBC: 4.54 MIL/uL (ref 3.87–5.11)
RDW: 12.4 % (ref 11.5–15.5)
WBC: 5.1 10*3/uL (ref 4.0–10.5)
nRBC: 0 % (ref 0.0–0.2)

## 2022-01-20 LAB — RAPID URINE DRUG SCREEN, HOSP PERFORMED
Amphetamines: POSITIVE — AB
Barbiturates: NOT DETECTED
Benzodiazepines: NOT DETECTED
Cocaine: NOT DETECTED
Opiates: NOT DETECTED
Tetrahydrocannabinol: NOT DETECTED

## 2022-01-20 LAB — TROPONIN I (HIGH SENSITIVITY)
Troponin I (High Sensitivity): <2 ng/L (ref <18)
Troponin I (High Sensitivity): <2 ng/L (ref <18)

## 2022-01-20 LAB — MAGNESIUM: Magnesium: 2 mg/dL (ref 1.7–2.4)

## 2022-01-20 LAB — AMMONIA: Ammonia: 11 umol/L (ref 9–35)

## 2022-01-20 LAB — I-STAT BETA HCG BLOOD, ED (MC, WL, AP ONLY): I-stat hCG, quantitative: <5 m[IU]/mL (ref <5)

## 2022-01-20 LAB — ETHANOL: Alcohol, Ethyl (B): 210 mg/dL — ABNORMAL HIGH (ref <10)

## 2022-01-20 LAB — VITAMIN B12: Vitamin B-12: 226 pg/mL (ref 180–914)

## 2022-01-20 MED ORDER — LORAZEPAM 2 MG/ML IJ SOLN: 1.0000 mg | Freq: Once | INTRAMUSCULAR | Status: DC | PRN

## 2022-01-20 MED ORDER — METOCLOPRAMIDE HCL 5 MG/ML IJ SOLN
10.0000 mg | Freq: Once | INTRAMUSCULAR | Status: AC
  Administered 2022-01-20: 10 mg via INTRAVENOUS
  Filled 2022-01-20: qty 2

## 2022-01-20 MED ORDER — IOHEXOL 350 MG/ML SOLN
75.0000 mL | Freq: Once | INTRAVENOUS | Status: AC | PRN
  Administered 2022-01-20: 75 mL via INTRAVENOUS

## 2022-01-20 MED ORDER — PROCHLORPERAZINE EDISYLATE 10 MG/2ML IJ SOLN
10.0000 mg | Freq: Once | INTRAMUSCULAR | Status: AC
  Administered 2022-01-20: 10 mg via INTRAVENOUS
  Filled 2022-01-20: qty 2

## 2022-01-20 MED ORDER — SODIUM CHLORIDE (PF) 0.9 % IJ SOLN
INTRAMUSCULAR | Status: AC
  Filled 2022-01-20: qty 50

## 2022-01-20 MED ORDER — GADOBUTROL 1 MMOL/ML IV SOLN
7.5000 mL | Freq: Once | INTRAVENOUS | Status: AC | PRN
  Administered 2022-01-20: 7.5 mL via INTRAVENOUS

## 2022-01-20 MED ORDER — DEXAMETHASONE SODIUM PHOSPHATE 10 MG/ML IJ SOLN: 10.0000 mg | Freq: Once | INTRAMUSCULAR | Status: DC

## 2022-01-20 MED ORDER — KETOROLAC TROMETHAMINE 15 MG/ML IJ SOLN: 15.0000 mg | Freq: Once | INTRAMUSCULAR | Status: DC

## 2022-01-20 MED ORDER — DIPHENHYDRAMINE HCL 50 MG/ML IJ SOLN
25.0000 mg | Freq: Once | INTRAMUSCULAR | Status: AC
  Administered 2022-01-20: 25 mg via INTRAVENOUS
  Filled 2022-01-20: qty 1

## 2022-01-20 MED ORDER — SODIUM CHLORIDE 0.9 % IV BOLUS: 500.0000 mL | Freq: Once | INTRAVENOUS | Status: DC

## 2022-01-20 MED ORDER — SODIUM CHLORIDE 0.9 % IV BOLUS
1000.0000 mL | Freq: Once | INTRAVENOUS | Status: AC
  Administered 2022-01-20: 1000 mL via INTRAVENOUS

## 2022-01-20 MED ORDER — CHLORDIAZEPOXIDE HCL 25 MG PO CAPS: ORAL_CAPSULE | ORAL | 0 refills | Status: DC

## 2022-01-20 MED ORDER — METOCLOPRAMIDE HCL 10 MG PO TABS: 10.0000 mg | ORAL_TABLET | Freq: Four times a day (QID) | ORAL | 0 refills | Status: DC | PRN

## 2022-01-20 NOTE — ED Notes (Signed)
Pt no longer wanted to wait pt left hospital. 

## 2022-01-20 NOTE — ED Triage Notes (Signed)
Pt arrived via POV, c/o nausea, vomiting and severe headache. States on and off headaches like this for about a year.

## 2022-01-20 NOTE — Discharge Instructions (Addendum)
Take Tylenol or Motrin for headache.  Take Reglan for severe headaches  Please avoid drinking alcohol.  Please take Librium to prevent withdrawal  See your doctor for follow-up  Please follow-up with the neurologist   Return to ER if you have worse arm numbness, headache, vomiting

## 2022-01-20 NOTE — ED Provider Notes (Signed)
[facility] DEPT Provider Note   CSN: [id number] Arrival date & time: 01/20/22  1451     History  Chief Complaint  Patient presents with   Migraine    Sylvia Maxwell is a 36 y.o. female.   Migraine   36 year old female presents emergency department with complaints of headache.  Patient states that she has had intermittent headaches for the past 6 to 8 months of which have been attempted to be treated at home with ibuprofen and Aleve.  She has not seen her primary care outpatient for said headaches.  She notes episodes of emesis and feelings of nausea after headaches of which have been occurring over the past 6 to 8 months.  She reports episode occurring around 1215 earlier today when she was coming here at her work.  She reports some associated feelings of "spacing out" with short episode of feeling dizzy.  She denies loss of consciousness or visual deficits.  She returned to baseline shortly after symptoms were experienced.  She also notes that she has had left hand tingling/numbness since the episode occurred earlier today.  New symptoms from chronic history of headache include this new left hand numbness.  Denies history of DVT/PE/current hormonal therapy, recent surgery/immobilization, no known cancer diagnosis.  Denies recent illness, fever, chills, night sweats, chest pain, shortness of breath, abdominal pain, hematemesis, urinary symptoms, change in bowel habits.  Patient has a history of alcohol dependence of which she states her most recent drink was last night but she states she had nothing to drink today.  Past medical history alcohol withdrawal seizure, alcohol abuse, heroin overdose, tobacco dependence chronic active hepatitis, major depressive disorder  Home Medications Prior to Admission medications   Medication Sig Start Date End Date Taking? Authorizing Provider  chlordiazePOXIDE (LIBRIUM) 25 MG capsule '50mg'$  PO TID x 1D, then 25-'50mg'$  PO BID X  1D, then 25-'50mg'$  PO QD X 1D 01/20/22  Yes Drenda Freeze, MD  fluticasone [facility]) 50 MCG/ACT nasal spray Place 1 spray into both nostrils daily.   Yes [provider]  lisdexamfetamine (VYVANSE) 40 MG capsule Take 40 mg by mouth daily. 01/01/22  Yes [provider]  metFORMIN (GLUCOPHAGE) 500 MG tablet Take 500 mg by mouth at bedtime. 05/22/21  Yes [provider]  metoCLOPramide (REGLAN) 10 MG tablet Take 1 tablet (10 mg total) by mouth every 6 (six) hours as needed for nausea (nausea/headache). 01/20/22  Yes Drenda Freeze, MD  Multiple Vitamin (MULTIVITAMIN) tablet Take 1 tablet by mouth daily.   Yes [provider]  vitamin B-12 (CYANOCOBALAMIN) 500 MCG tablet Take 500 mcg by mouth daily.   Yes [provider]  buPROPion (WELLBUTRIN XL) 300 MG 24 hr tablet Take 1 tablet (300 mg total) by mouth daily. Patient not taking: Reported on 01/20/2022 12/31/21   Ronnell Freshwater, NP      Allergies    Lactose intolerance (gi)    Review of Systems   Review of Systems  All other systems reviewed and are negative.   Physical Exam Updated Vital Signs BP 112/79   Pulse 99   Temp 98.1 F (36.7 C)   Resp 16   SpO2 99%  Physical Exam Vitals and nursing note reviewed.  Constitutional:      General: She is not in acute distress.    Appearance: She is well-developed.     Comments: Patient actively fidgeting with hands during exam.  No obvious tremor or asterixis noted.  Movements seem  to be purposeful.  Patient does not maintain eye contact.  HENT:     Head: Normocephalic and atraumatic.  Eyes:     Extraocular Movements: Extraocular movements intact.     Conjunctiva/sclera: Conjunctivae normal.     Pupils: Pupils are equal, round, and reactive to light.  Cardiovascular:     Rate and Rhythm: Normal rate and regular rhythm.     Heart sounds: No murmur heard. Pulmonary:     Effort: Pulmonary effort is normal. No respiratory distress.      Breath sounds: Normal breath sounds. No wheezing or rales.  Abdominal:     Palpations: Abdomen is soft.     Tenderness: There is no abdominal tenderness.  Musculoskeletal:        General: No swelling. Normal range of motion.     Cervical back: Neck supple. No rigidity or tenderness.     Right lower leg: No edema.     Left lower leg: No edema.  Skin:    General: Skin is warm and dry.     Capillary Refill: Capillary refill takes less than 2 seconds.  Neurological:     Mental Status: She is alert.     GCS: GCS eye subscore is 4. GCS verbal subscore is 5. GCS motor subscore is 6.     Comments: Alert and oriented to self, place, time and event.   Speech is fluent, clear without dysarthria or dysphasia.   Strength 5/5 in upper/lower extremities   Sensation intact in upper/lower extremities besides patient's left hand along pinky, ring finger with mild proximal extension into palmar aspect of hand.  Normal gait.  Negative Romberg. No pronator drift.  Normal finger-to-nose and feet tapping.  CN I not tested  CN II grossly intact visual fields bilaterally. Did not visualize posterior eye.  CN III, IV, VI PERRLA and EOMs intact bilaterally  CN V Intact sensation to sharp and light touch to the face  CN VII facial movements symmetric  CN VIII not tested  CN IX, X no uvula deviation, symmetric rise of soft palate  CN XI 5/5 SCM and trapezius strength bilaterally  CN XII Midline tongue protrusion, symmetric L/R movements     Psychiatric:        Mood and Affect: Mood normal.     ED Results / Procedures / Treatments   Labs (all labs ordered are listed, but only abnormal results are displayed) Labs Reviewed  COMPREHENSIVE METABOLIC PANEL - Abnormal; Notable for the following components:      Result Value   Creatinine, Ser 0.41 (*)    All other components within normal limits  RAPID URINE DRUG SCREEN, HOSP PERFORMED - Abnormal; Notable for the following components:   Amphetamines  POSITIVE (*)    All other components within normal limits  ETHANOL - Abnormal; Notable for the following components:   Alcohol, Ethyl (B) 210 (*)    All other components within normal limits  CBC WITH DIFFERENTIAL/PLATELET  AMMONIA  MAGNESIUM  VITAMIN B12  I-STAT BETA HCG BLOOD, ED ([facility], [facility], [facility] ONLY)  TROPONIN I (HIGH SENSITIVITY)  TROPONIN I (HIGH SENSITIVITY)    EKG EKG Interpretation  Date/Time:  Tuesday January 20 2022 16:01:46 EDT Ventricular Rate:  90 PR Interval:  147 QRS Duration: 75 QT Interval:  385 QTC Calculation: 472 R Axis:   45 Text Interpretation: Sinus rhythm Low voltage, precordial leads No significant change since last tracing Confirmed by Wandra Arthurs [phone]) on 01/20/2022 4:32:17 PM  Radiology MR  BRAIN W WO CONTRAST  Result Date: 01/20/2022 CLINICAL DATA:  Ataxia. Cervical pathology suspected. Rule out multiple sclerosis. EXAM: MRI HEAD WITHOUT AND WITH CONTRAST MRI CERVICAL SPINE WITHOUT AND WITH CONTRAST TECHNIQUE: Multiplanar, multiecho pulse sequences of the brain and surrounding structures, and cervical spine, to include the craniocervical junction and cervicothoracic junction, were obtained without and with intravenous contrast. CONTRAST:  7.76m GADAVIST GADOBUTROL 1 MMOL/ML IV SOLN COMPARISON:  CT angio head neck 01/20/2022 FINDINGS: MRI HEAD FINDINGS Brain: No acute infarction, hemorrhage, hydrocephalus, extra-axial collection or mass lesion. Normal white matter. No evidence of demyelinating disease. Brainstem normal. No pathologic enhancement. Postcontrast imaging of the brain degraded by motion. Vascular: Normal arterial flow voids Skull and upper cervical spine: No focal skeletal lesion. Sinuses/Orbits: Negative Other: None MRI CERVICAL SPINE FINDINGS Alignment: Normal Vertebrae: Normal bone marrow. Negative for fracture or mass. Hemangioma C7 vertebral body on the left. Cord: Normal signal and morphology.  No cord enhancing lesion. Posterior Fossa,  vertebral arteries, paraspinal tissues: Negative Disc levels: C2-3: Negative C3-4: Negative C4-5: Mild disc bulging.  Negative for stenosis C5-6: Negative C6-7: Negative C7-T1: Negative IMPRESSION: 1. Negative MRI head with contrast. 2. Negative MRI cervical spine with contrast. Normal cervical spinal cord. Electronically Signed   By: CFranchot GalloM.D.   On: 01/20/2022 21:59   MR Cervical Spine W or Wo Contrast  Result Date: 01/20/2022 CLINICAL DATA:  Ataxia. Cervical pathology suspected. Rule out multiple sclerosis. EXAM: MRI HEAD WITHOUT AND WITH CONTRAST MRI CERVICAL SPINE WITHOUT AND WITH CONTRAST TECHNIQUE: Multiplanar, multiecho pulse sequences of the brain and surrounding structures, and cervical spine, to include the craniocervical junction and cervicothoracic junction, were obtained without and with intravenous contrast. CONTRAST:  7.555mGADAVIST GADOBUTROL 1 MMOL/ML IV SOLN COMPARISON:  CT angio head neck 01/20/2022 FINDINGS: MRI HEAD FINDINGS Brain: No acute infarction, hemorrhage, hydrocephalus, extra-axial collection or mass lesion. Normal white matter. No evidence of demyelinating disease. Brainstem normal. No pathologic enhancement. Postcontrast imaging of the brain degraded by motion. Vascular: Normal arterial flow voids Skull and upper cervical spine: No focal skeletal lesion. Sinuses/Orbits: Negative Other: None MRI CERVICAL SPINE FINDINGS Alignment: Normal Vertebrae: Normal bone marrow. Negative for fracture or mass. Hemangioma C7 vertebral body on the left. Cord: Normal signal and morphology.  No cord enhancing lesion. Posterior Fossa, vertebral arteries, paraspinal tissues: Negative Disc levels: C2-3: Negative C3-4: Negative C4-5: Mild disc bulging.  Negative for stenosis C5-6: Negative C6-7: Negative C7-T1: Negative IMPRESSION: 1. Negative MRI head with contrast. 2. Negative MRI cervical spine with contrast. Normal cervical spinal cord. Electronically Signed   By: ChFranchot Gallo.D.    On: 01/20/2022 21:59   CT ANGIO HEAD NECK W WO CM  Result Date: 01/20/2022 CLINICAL DATA:  TIA EXAM: CT ANGIOGRAPHY HEAD AND NECK TECHNIQUE: Multidetector CT imaging of the head and neck was performed using the standard protocol during bolus administration of intravenous contrast. Multiplanar CT image reconstructions and MIPs were obtained to evaluate the vascular anatomy. Carotid stenosis measurements (when applicable) are obtained utilizing NASCET criteria, using the distal internal carotid diameter as the denominator. RADIATION DOSE REDUCTION: This exam was performed according to the departmental dose-optimization program which includes automated exposure control, adjustment of the mA and/or kV according to patient size and/or use of iterative reconstruction technique. CONTRAST:  7570mMNIPAQUE IOHEXOL 350 MG/ML SOLN COMPARISON:  CT head 01/11/2018.  CT angio head neck 01/11/2018 FINDINGS: CT HEAD FINDINGS Brain: No evidence of acute infarction, hemorrhage, hydrocephalus, extra-axial collection or mass lesion/mass effect.  Vascular: Negative for hyperdense vessel Skull: Negative Sinuses/Orbits: Negative Other: None Review of the MIP images confirms the above findings CTA NECK FINDINGS Aortic arch: Standard branching. Imaged portion shows no evidence of aneurysm or dissection. No significant stenosis of the major arch vessel origins. Right carotid system: Normal right carotid. No atherosclerotic disease or stenosis. Left carotid system: Normal left carotid. Negative for atherosclerotic disease or stenosis. Vertebral arteries: Both vertebral arteries widely patent without stenosis. Skeleton: Negative Other neck: Negative for mass or edema in the neck. Upper chest: Mild apical emphysema. Mild patchy airspace disease in the upper lobes bilaterally not present in 2019. Review of the MIP images confirms the above findings CTA HEAD FINDINGS Anterior circulation: Internal carotid artery normal through the skull base  and cavernous segment. Anterior and middle cerebral arteries normal bilaterally. No stenosis or aneurysm. Posterior circulation: Both vertebral arteries patent to the basilar. PICA patent. Basilar widely patent. Superior cerebellar and posterior cerebral arteries patent. No stenosis or aneurysm. Venous sinuses: Normal dural venous sinus enhancement. Anatomic variants: None Review of the MIP images confirms the above findings IMPRESSION: 1. Negative CT head. 2. Negative CT angiogram of the head and neck. 3. Mild apical emphysema. Mild patchy airspace disease in the upper lobes bilaterally not present in 2019. This could be due to edema or infection. Emphysema (ICD10-J43.9). Electronically Signed   By: Franchot Gallo M.D.   On: 01/20/2022 18:16    Procedures Procedures    Medications Ordered in ED Medications  sodium chloride (PF) 0.9 % injection (has no administration in time range)  LORazepam (ATIVAN) injection 1 mg (has no administration in time range)  prochlorperazine (COMPAZINE) injection 10 mg (10 mg Intravenous Given 01/20/22 1614)  diphenhydrAMINE (BENADRYL) injection 25 mg (25 mg Intravenous Given 01/20/22 1614)  sodium chloride 0.9 % bolus 1,000 mL (0 mLs Intravenous Stopped 01/20/22 1927)  iohexol (OMNIPAQUE) 350 MG/ML injection 75 mL (75 mLs Intravenous Contrast Given 01/20/22 1756)  metoCLOPramide (REGLAN) injection 10 mg (10 mg Intravenous Given 01/20/22 1926)  diphenhydrAMINE (BENADRYL) injection 25 mg (25 mg Intravenous Given 01/20/22 1927)  gadobutrol (GADAVIST) 1 MMOL/ML injection 7.5 mL (7.5 mLs Intravenous Contrast Given 01/20/22 2126)    ED Course/ Medical Decision Making/ A&P                           Medical Decision Making Amount and/or Complexity of Data Reviewed Labs: ordered. Radiology: ordered.  Risk Prescription drug management.   This patient presents to the ED for concern of migraine, this involves an extensive number of treatment options, and is a  complaint that carries with it a high risk of complications and morbidity.  The differential diagnosis includes CVA, carotid artery dissection, electrolyte abnormality, Warnicke encephalopathy, MS, cerebral venous thrombosis, pseudotumor cerebri, meningitis   Co morbidities that complicate the patient evaluation  See HPI   Additional history obtained:  Additional history obtained from EMR External records from outside source obtained and reviewed including prior CT angio head and neck from 01/2018   Lab Tests:  I Ordered, and personally interpreted labs.  The pertinent results include: No leukocytosis noted.  No evidence anemia.  Platelets within normal range.  No electrolyte abnormalities noted.  Renal function within normal limits.  No transaminitis noted.  Magnesium within normal limits.  Ammonia within normal limits.  Beta-hCG negative.  Ethanol 210 which is elevated.  UDS significant for positive amphetamines.  Initial troponin less than 2 with repeat less than  2; no new EKG findings indicative of ischemia.  Doubt ACS.  Vitamin B12 within normal limits.   Imaging Studies ordered:  I ordered imaging studies including CT angio head and neck, MR cervical spine and brain with and without contrast I independently visualized and interpreted imaging which showed  CT angio head and neck: Negative CT head.  Negative CT angiogram of the head and neck.  Mild apical emphysema.  Mild patchy airspace disease in the upper lobes bilaterally. MR cervical spine/MR brain: Negative MRI brain and cervical spine. I agree with the radiologist interpretation  Cardiac Monitoring: / EKG:  The patient was maintained on a cardiac monitor.  I personally viewed and interpreted the cardiac monitored which showed an underlying rhythm of: Sinus rhythm with no acute changes from prior study   Consultations Obtained:  Attending physician Dr. Darl Householder was consulted regarding the patient.  He assessed the patient  independently and was agreement with treatment plan going forward.  Problem List / ED Course / Critical interventions / Medication management  Migraine I ordered medication including Compazine, Benadryl, Reglan and 1 L normal saline for migraine cocktail.    Reevaluation of the patient after these medicines showed that the patient improved I have reviewed the patients home medicines and have made adjustments as needed   Social Determinants of Health:  Chronic alcohol use.  Chronic illicit drug use.  Former cigarette use.   Test / Admission - Considered:  Migraine/left arm numbness Vitals signs significant for initial tachycardia with a rate of 107 which decreased with time elapsed on emergency department as well as medications administered.. Otherwise within normal range and stable throughout visit. Laboratory/imaging studies significant for: See above Patient symptoms evaluated thoroughly in the emergency department.  Concern for drug/alcohol withdrawal/intoxication, MS, CVA, Warnicke's encephalopathy, carotid artery dissection.  Imaging studies conducted on emergency department negative for any acute abnormalities.  Doubt MS.  Doubt CVA.  Doubt Warnicke's encephalopathy given B12 within normal range.  Doubt carotid artery dissection.  Patient noted significant improvement of symptoms of headache with administration of migraine cocktail.  Patient's tachycardia and tremor could be related to acute alcohol withdrawal.  To be provided Librium taper for prevention of withdrawal outpatient.  Reglan to be prescribed for antiemetic/severe headaches.  Close follow-up with neurology recommended outpatient for patient's recurrent migraine type headaches.  Treatment plan discussed at length with patient and she knowledge understanding was agreeable to said plan. Worrisome signs and symptoms were discussed with the patient, and the patient acknowledged understanding to return to the ED if noticed. Patient  was stable upon discharge.         Final Clinical Impression(s) / ED Diagnoses Final diagnoses:  Other migraine without status migrainosus, not intractable  Arm numbness left    Rx / DC Orders ED Discharge Orders          Ordered    metoCLOPramide (REGLAN) 10 MG tablet  Every 6 hours PRN        01/20/22 2208    chlordiazePOXIDE (LIBRIUM) 25 MG capsule        01/20/22 2208    Ambulatory referral to Neurology       Comments: An appointment is requested in approximately: 1 week   01/20/22 2209              Wilnette Kales, [place] 01/21/22 0001    Drenda Freeze, MD 01/22/22 1459

## 2022-02-03 ENCOUNTER — Ambulatory Visit
Admission: EM | Admit: 2022-02-03 | Discharge: 2022-02-03 | Disposition: A | Discharge status: Home / Self Care | Payer: 59 | Attending: Emergency Medicine | Admitting: Emergency Medicine

## 2022-02-03 ENCOUNTER — Ambulatory Visit (INDEPENDENT_AMBULATORY_CARE_PROVIDER_SITE_OTHER): Payer: 59

## 2022-02-03 DIAGNOSIS — M5442 Lumbago with sciatica, left side: Secondary | ICD-10-CM | POA: Diagnosis not present

## 2022-02-03 DIAGNOSIS — M545 Low back pain, unspecified: Secondary | ICD-10-CM

## 2022-02-03 DIAGNOSIS — M79605 Pain in left leg: Secondary | ICD-10-CM | POA: Diagnosis not present

## 2022-02-03 DIAGNOSIS — M25552 Pain in left hip: Secondary | ICD-10-CM

## 2022-02-03 MED ORDER — LIDOCAINE 5 % EX PTCH: 1.0000 | MEDICATED_PATCH | CUTANEOUS | 0 refills | Status: DC

## 2022-02-03 MED ORDER — TIZANIDINE HCL 4 MG PO TABS: 4.0000 mg | ORAL_TABLET | Freq: Three times a day (TID) | ORAL | 0 refills | Status: DC | PRN

## 2022-02-03 MED ORDER — METHYLPREDNISOLONE 4 MG PO TBPK: ORAL_TABLET | Freq: Every day | ORAL | 0 refills | Status: DC

## 2022-02-03 MED ORDER — MELOXICAM 15 MG PO TABS: 15.0000 mg | ORAL_TABLET | Freq: Every day | ORAL | 0 refills | Status: DC

## 2022-02-03 NOTE — Discharge Instructions (Addendum)
Take the Mobic with 1000 mg of Tylenol once a day.  You can take an additional 1000 mg of Tylenol 1-2 more times a day, however, I would use this sparingly.  Finish the steroids, even if you feel better.  Zanaflex will help with muscle spasms.  Heat pad/warm Epsom salt soaks.  Capsaicin or Lidoderm patches can also help with the pain.  I have put an urgent referral into orthopedics for further evaluation.  Go to the ER for fevers above 100.4, numbness in between your legs, if you are unable to move your leg, if you have accidentally urinated or defecated on yourself, if you cannot completely empty your bladder, or for other concerns.

## 2022-02-03 NOTE — ED Triage Notes (Signed)
Pt complains of generalized body aches with extreme pain in left lower back back that radiates down left leg X 1 week.

## 2022-02-03 NOTE — ED Provider Notes (Signed)
HPI  SUBJECTIVE:  Sylvia Maxwell is a 36 y.o. female who presents with 2 months of left lower back pain/buttock pain hip pain with walking.  She reports left hip popping when she walks.  She describes that it got worse over the past week after getting a massage.  It is now radiating down the back of her leg.  She reports  leg weakness secondary to pain.  Denies true weakness.  No fevers, saddle anesthesia, recent trauma, heavy lifting, urinary or fecal incontinence, urinary retention, abdominal pain, rash in the area of pain.  States that she cannot sleep at night because of the pain.  She has tried massage, ibuprofen, Aleve, putting pressure on her left lower back, and took a family members tramadol last night.  The pressure and tramadol helped.  Symptoms are worse with massage, weightbearing, standing up.  She has never had symptoms like this before. Patient has a past medical history of alcohol dependence, alcohol withdrawal seizures, hepatitis, polysubstance use, depression.  Denies current illicit drug use.  States that she finished the Librium.  LMP: Now.  Denies the possibility of being pregnant.  No history of osteoporosis, prolonged steroid use, IVDU, HIV.  PCP: None.  Past Medical History:  Diagnosis Date   Alcoholism ([place])    Bronchitis    Depression    Hepatitis    ? carrier  .01- HEP C - was told when she tried to give blood   Pneumonia    walking pneumonia   aspiriation Pneumonia 05/2017   Seizures ([employer])    alcohol withdrawl    Past Surgical History:  Procedure Laterality Date   BREAST ENHANCEMENT SURGERY  2008   DILATION AND EVACUATION N/A 04/15/2018   Procedure: DILATATION AND EVACUATION;  Surgeon: Osborne Oman, MD;  Location: [facility];  Service: Gynecology;  Laterality: N/A;   OPEN REDUCTION, INTERNAL FIXATION (ORIF) CALCANEAL FRACTURE WITH FUSION Left 09/27/2018   Procedure: OPEN REDUCTION, INTERNAL FIXATION (ORIF) CALCANEAL FRACTURE AND EXPLORE PERONEAL TENDONS;  Surgeon:  Erle Crocker, MD;  Location: [facility];  Service: Orthopedics;  Laterality: Left;    Family History  Problem Relation Age of Onset   Alcoholism Other    Diabetes Other     Social History   Tobacco Use   Smoking status: Former    Years: 10.00    Types: Cigarettes    Quit date: 05/2017    Years since quitting: 4.7   Smokeless tobacco: Never  Vaping Use   Vaping Use: Never used  Substance Use Topics   Alcohol use: Yes   Drug use: Not Currently    Comment:  Meth, cocaine - 05/2017    No current facility-administered medications for this encounter.  Current Outpatient Medications:    lidocaine (LIDODERM) 5 %, Place 1 patch onto the skin daily. Remove & Discard patch within 12 hours or as directed by MD, Disp: 6 patch, Rfl: 0   meloxicam (MOBIC) 15 MG tablet, Take 1 tablet (15 mg total) by mouth daily for 10 days., Disp: 10 tablet, Rfl: 0   methylPREDNISolone (MEDROL DOSEPAK) 4 MG TBPK tablet, Take by mouth daily. Follow package instructions, Disp: 21 tablet, Rfl: 0   tiZANidine (ZANAFLEX) 4 MG tablet, Take 1 tablet (4 mg total) by mouth every 8 (eight) hours as needed for muscle spasms., Disp: 30 tablet, Rfl: 0   buPROPion (WELLBUTRIN XL) 300 MG 24 hr tablet, Take 1 tablet (300 mg total) by mouth daily. (Patient not taking: Reported on 01/20/2022),  Disp: 30 tablet, Rfl: 2   chlordiazePOXIDE (LIBRIUM) 25 MG capsule, '50mg'$  PO TID x 1D, then 25-'50mg'$  PO BID X 1D, then 25-'50mg'$  PO QD X 1D, Disp: 10 capsule, Rfl: 0   fluticasone (FLONASE) 50 MCG/ACT nasal spray, Place 1 spray into both nostrils daily., Disp: , Rfl:    lisdexamfetamine (VYVANSE) 40 MG capsule, Take 40 mg by mouth daily., Disp: , Rfl:    metFORMIN (GLUCOPHAGE) 500 MG tablet, Take 500 mg by mouth at bedtime., Disp: , Rfl:    metoCLOPramide (REGLAN) 10 MG tablet, Take 1 tablet (10 mg total) by mouth every 6 (six) hours as needed for nausea (nausea/headache)., Disp: 10 tablet, Rfl: 0   Multiple Vitamin (MULTIVITAMIN) tablet,  Take 1 tablet by mouth daily., Disp: , Rfl:    vitamin B-12 (CYANOCOBALAMIN) 500 MCG tablet, Take 500 mcg by mouth daily., Disp: , Rfl:   Allergies  Allergen Reactions   Lactose Intolerance (Gi) Diarrhea and Nausea And Vomiting    Depends on the type of dairy-based product consumed as to which reaction occurs     ROS  As noted in HPI.   Physical Exam  BP 123/85   Pulse 100   Temp 98.2 F (36.8 C) (Oral)   Resp 17   LMP 01/03/2022   SpO2 96%   Constitutional: Well developed, well nourished, peers to be in moderate painful distress.  Crying. Eyes: PERRL, EOMI, conjunctiva normal bilaterally HENT: Normocephalic, atraumatic,mucus membranes moist Respiratory: Clear to auscultation bilaterally, no rales, no wheezing, no rhonchi Cardiovascular: Normal rate and rhythm, no murmurs, no gallops, no rubs GI: Soft, nondistended, normal bowel sounds, nontender, no rebound, no guarding Back: + Positive left-sided paralumbar tenderness, + muscle spasm. No L spine, sarcal tenderness.  Positive left SI joint tenderness.  Left lateral hip tender.  No rash in the area of pain. intact DP  pulses, pain with external rotation, abduction flexion/extension left hip. SLR painful on the left side but neg bilaterally. Sensation intact to light touch bilaterally over both legs, DTR's symmetric and intact bilaterally KJ, Motor symmetric bilateral 5/5 hip flexion, quadriceps, hamstrings, EHL, foot dorsiflexion, foot plantarflexion, gait somewhat antalgic but without apparent new ataxia.  skin: No rash, skin intact Musculoskeletal: No edema, no tenderness, no deformities Neurologic: Alert & oriented x 3, CN III-XII grossly intact, no motor deficits, sensation grossly intact Psychiatric: Speech and behavior appropriate   ED Course   Medications - No data to display  Orders Placed This Encounter  Procedures   DG Lumbar Spine Complete    Standing Status:   Standing    Number of Occurrences:   1    Order  Specific Question:   Reason for Exam (SYMPTOM  OR DIAGNOSIS REQUIRED)    Answer:   Left low back pain 2 months rule out acute changes    Order Specific Question:   Is patient pregnant?    Answer:   No   DG Hip Unilat With Pelvis 2-3 Views Left    Standing Status:   Standing    Number of Occurrences:   1    Order Specific Question:   Symptom/Reason for Exam    Answer:   Left hip pain [[id number]]   AMB referral to orthopedics    Referral Priority:   Urgent    Referral Type:   Consultation    Number of Visits Requested:   1   Nursing Communication    Standing Status:   Standing    Number of Occurrences:  1   No results found for this or any previous visit (from the past 24 hour(s)). DG Lumbar Spine Complete  Result Date: 02/03/2022 CLINICAL DATA:  Low back pain that radiates down the left leg. EXAM: LUMBAR SPINE - COMPLETE 4+ VIEW COMPARISON:  None Available. FINDINGS: There is no evidence of lumbar spine fracture. Alignment is normal. Mild degenerate disc disease at L4-L5 and L5-S1 with associated facet joint arthropathy. IMPRESSION: Mild degenerative disc disease at L4-L5 and L5-S1 with associated facet joint arthropathy. Electronically Signed   By: Keane Police D.O.   On: 02/03/2022 10:49   DG Hip Unilat With Pelvis 2-3 Views Left  Result Date: 02/03/2022 CLINICAL DATA:  Back pain that radiates to the left leg. EXAM: DG HIP (WITH OR WITHOUT PELVIS) 2-3V LEFT COMPARISON:  None Available. FINDINGS: There is no evidence of hip fracture or dislocation. There is no evidence of arthropathy or other focal bone abnormality. IMPRESSION: Negative. Electronically Signed   By: Keane Police D.O.   On: 02/03/2022 10:48    ED Clinical Impression  1. Acute left-sided low back pain with left-sided sciatica   2. Left hip pain      ED Assessment/Plan     Patient took an NSAID within 6 hours of evaluation.  Discussed with him that unfortunately, I would not be able to give her a shot of Toradol.   Offered Tylenol, but patient declined.   will get left hip x-ray as it has been bothering her in addition to an L-spine given duration of symptoms.  I am hesitant to prescribe opiates because a history of opioid dependence, and Tylenol given history of hepatitis although patient states that her liver has recovered, however, I think that sparing use of Tylenol will be fine.  Labs done earlier this month shows normal AST, ALT.  Offered patient Tylenol here.  She declined.  Discussed with her that unfortunately, I do not have any other pain medications other than NSAIDs and Tylenol.  Reviewed imaging independently.  Degenerative disc disease at L4-L5, L5-S1 with facet arthropathy.  Normal hip films.  See radiology report for full details.  1.  Acute on chronic back pain: X-rays show degenerative disc disease.  will send home with Zanaflex, Medrol Dosepak, Mobic, lidocaine patches and capsaicin patches, heating pad.  She is to take Tylenol sparingly.  Discussed that I advise for her to not take her significant other's tramadol as it can lower the seizure threshold.  will place urgent referral to orthopedics.  Setting up with a PCP prior to discharge.  2.  Hip pain.  X-rays normal.  Mobic, Tylenol, steroids.  Discussed imaging, MDM, treatment plan, and plan for follow-up with patient and family member.  Discussed sn/sx that should prompt return to the ED. they agree with plan.   Meds ordered this encounter  Medications   tiZANidine (ZANAFLEX) 4 MG tablet    Sig: Take 1 tablet (4 mg total) by mouth every 8 (eight) hours as needed for muscle spasms.    Dispense:  30 tablet    Refill:  0   methylPREDNISolone (MEDROL DOSEPAK) 4 MG TBPK tablet    Sig: Take by mouth daily. Follow package instructions    Dispense:  21 tablet    Refill:  0   meloxicam (MOBIC) 15 MG tablet    Sig: Take 1 tablet (15 mg total) by mouth daily for 10 days.    Dispense:  10 tablet    Refill:  0   lidocaine (  LIDODERM) 5 %     Sig: Place 1 patch onto the skin daily. Remove & Discard patch within 12 hours or as directed by MD    Dispense:  6 patch    Refill:  0      *This clinic note was created using Dragon dictation software. Therefore, there may be occasional mistakes despite careful proofreading. ?    Melynda Ripple, MD 02/03/22 1116

## 2022-02-05 ENCOUNTER — Ambulatory Visit (INDEPENDENT_AMBULATORY_CARE_PROVIDER_SITE_OTHER): Payer: 59

## 2022-02-05 ENCOUNTER — Ambulatory Visit (INDEPENDENT_AMBULATORY_CARE_PROVIDER_SITE_OTHER): Payer: 59 | Admitting: Orthopedic Surgery

## 2022-02-05 ENCOUNTER — Encounter: Payer: Self-pay | Admitting: Orthopedic Surgery

## 2022-02-05 VITALS — BP 129/93 | HR 73 | Ht 66.0 in | Wt 169.0 lb

## 2022-02-05 DIAGNOSIS — M545 Low back pain, unspecified: Secondary | ICD-10-CM

## 2022-02-05 DIAGNOSIS — M5416 Radiculopathy, lumbar region: Secondary | ICD-10-CM

## 2022-02-05 NOTE — Progress Notes (Signed)
Orthopedic Spine Surgery Office Note  Assessment: Patient is a 36 y.o. female with chronic low back pain that has progressed in intensity and now includes a more radicular type pain into the left leg. Has some exam findings of left sided SI joint pain too.  Plan: -Explained that initially conservative treatment is tried as a significant number of patients may experience relief with these treatment modalities. Discussed that the conservative treatments include:  -activity modification  -physical therapy  -over the counter pain medications  -medrol dosepak  -lumbar steroid injections -Patient has tried NSAIDs, Medrol Dosepak, activity modification -Recommended MRI of the lumbar spine to evaluate for radiculopathy.  Order was placed today. -Patient should return to office in 3 weeks, repeat x-rays of lumbar spine at next visit: none   Patient expressed understanding of the plan and all questions were answered to the patient's satisfaction.   ___________________________________________________________________________   History:  Patient is a 35 y.o. female who presents today for lumbar spine. Patient has had over 6 months of low back pain. In the last several weeks, the pain has now started to radiate into her left thigh. She feels it go down her buttock into the lateral aspect of her thigh. It does not go below the knee. There is no right sided symptoms. There was no trauma or injury antecedent to the pain. She states that standing for a prolonged period of time and laying flat aggravate the pain. Sitting makes it mildly better but she still feels it.    Weakness: yes, feels her legs are weaker Symptoms of imbalance: yes, feels she has been clumsy for years Paresthesias and numbness: denies Bowel or bladder incontinence: denies Saddle anesthesia: denies  Treatments tried: activity modification, husband's tramadol, medrol dosepak, NSAIDs  Review of systems: Denies fevers and chills,  night sweats, unexplained weight loss, history of cancer. Reports pain does wake her at night.   Past medical history: Migraines  Depression Anxiety Hepatitis Chronic pain  Allergies: lactose  Past surgical history:  Breast augmentation Miscarriage Heel surgery  Social history: Denies use of nicotine product (smoking, vaping, patches, smokeless) Alcohol use: social Denies recreational drug use   Physical Exam:  General: no acute distress, appears stated age Neurologic: alert, answering questions appropriately, following commands Respiratory: unlabored breathing on room air, symmetric chest rise Psychiatric: appropriate affect, normal cadence to speech   MSK (spine):  -Strength exam      Left  Right EHL    5/5  5/5 TA    5/5  5/5 GSC    5/5  5/5 Knee extension  5/5  5/5 Hip flexion   5/5  5/5  -Sensory exam    Sensation intact to light touch in L3-S1 nerve distributions of bilateral lower extremities  -Achilles DTR: 2/4 on the left, 2/4 on the right -Patellar tendon DTR: 2/4 on the left, 2/4 on the right  -Straight leg raise: negative -Contralateral straight leg raise: negative -Clonus: no beats bilaterally -Gait: normal -Negative hoffman bilaterally, negative grip and release test, no interosseous atrophy  -Left hip exam: no pain through range of motion, positive SI compression test, positive gaeslen's, had pain with single leg stance on that side, negative SI distraction test, negative FABER, negative stinchfield -Right hip exam: no pain through range of motion, negative stinchfield, negative FABER  Imaging: XR of the lumbar spine from 02/05/2022 and 02/03/2022 was independently reviewed and interpreted, showing minimal disc height loss at L5/S1 and L4/5. No fracture or dislocation. No evidence of instability  on flexion/extension. Grade 1 spondylolisthesis at L5/S1.    Patient name: Sylvia Maxwell Patient MRN: [id number] Date of visit: 02/05/22

## 2022-02-08 ENCOUNTER — Emergency Department (HOSPITAL_COMMUNITY)
Admission: EM | Admit: 2022-02-08 | Discharge: 2022-02-08 | Disposition: A | Discharge status: Home / Self Care | Payer: 59 | Attending: Emergency Medicine | Admitting: Emergency Medicine

## 2022-02-08 ENCOUNTER — Encounter (HOSPITAL_COMMUNITY): Payer: Self-pay | Admitting: Emergency Medicine

## 2022-02-08 ENCOUNTER — Other Ambulatory Visit: Payer: Self-pay

## 2022-02-08 DIAGNOSIS — R55 Syncope and collapse: Secondary | ICD-10-CM | POA: Diagnosis present

## 2022-02-08 DIAGNOSIS — M5442 Lumbago with sciatica, left side: Secondary | ICD-10-CM

## 2022-02-08 DIAGNOSIS — F10129 Alcohol abuse with intoxication, unspecified: Secondary | ICD-10-CM | POA: Diagnosis not present

## 2022-02-08 DIAGNOSIS — Y908 Blood alcohol level of 240 mg/100 ml or more: Secondary | ICD-10-CM | POA: Diagnosis not present

## 2022-02-08 DIAGNOSIS — Y93E1 Activity, personal bathing and showering: Secondary | ICD-10-CM | POA: Insufficient documentation

## 2022-02-08 DIAGNOSIS — F10929 Alcohol use, unspecified with intoxication, unspecified: Secondary | ICD-10-CM

## 2022-02-08 LAB — CBC WITH DIFFERENTIAL/PLATELET
Abs Immature Granulocytes: 0.01 10*3/uL (ref 0.00–0.07)
Basophils Absolute: 0 10*3/uL (ref 0.0–0.1)
Basophils Relative: 1 %
Eosinophils Absolute: 0 10*3/uL (ref 0.0–0.5)
Eosinophils Relative: 1 %
HCT: 36.4 % (ref 36.0–46.0)
Hemoglobin: 12.3 g/dL (ref 12.0–15.0)
Immature Granulocytes: 0 %
Lymphocytes Relative: 55 %
Lymphs Abs: 2.4 10*3/uL (ref 0.7–4.0)
MCH: 33.2 pg (ref 26.0–34.0)
MCHC: 33.8 g/dL (ref 30.0–36.0)
MCV: 98.1 fL (ref 80.0–100.0)
Monocytes Absolute: 0.3 10*3/uL (ref 0.1–1.0)
Monocytes Relative: 7 %
Neutro Abs: 1.5 10*3/uL — ABNORMAL LOW (ref 1.7–7.7)
Neutrophils Relative %: 36 %
Platelets: 160 10*3/uL (ref 150–400)
RBC: 3.71 MIL/uL — ABNORMAL LOW (ref 3.87–5.11)
RDW: 12.4 % (ref 11.5–15.5)
WBC: 4.2 10*3/uL (ref 4.0–10.5)
nRBC: 0 % (ref 0.0–0.2)

## 2022-02-08 LAB — COMPREHENSIVE METABOLIC PANEL
ALT: 35 U/L (ref 0–44)
AST: 55 U/L — ABNORMAL HIGH (ref 15–41)
Albumin: 3.2 g/dL — ABNORMAL LOW (ref 3.5–5.0)
Alkaline Phosphatase: 52 U/L (ref 38–126)
Anion gap: 8 (ref 5–15)
BUN: 13 mg/dL (ref 6–20)
CO2: 26 mmol/L (ref 22–32)
Calcium: 8 mg/dL — ABNORMAL LOW (ref 8.9–10.3)
Chloride: 107 mmol/L (ref 98–111)
Creatinine, Ser: 0.47 mg/dL (ref 0.44–1.00)
GFR, Estimated: >60 mL/min (ref >60)
Glucose, Bld: 140 mg/dL — ABNORMAL HIGH (ref 70–99)
Potassium: 4.6 mmol/L (ref 3.5–5.1)
Sodium: 141 mmol/L (ref 135–145)
Total Bilirubin: 0.7 mg/dL (ref 0.3–1.2)
Total Protein: 5.7 g/dL — ABNORMAL LOW (ref 6.5–8.1)

## 2022-02-08 LAB — MAGNESIUM: Magnesium: 2 mg/dL (ref 1.7–2.4)

## 2022-02-08 LAB — I-STAT BETA HCG BLOOD, ED (MC, WL, AP ONLY): I-stat hCG, quantitative: <5 m[IU]/mL (ref <5)

## 2022-02-08 LAB — ETHANOL: Alcohol, Ethyl (B): 412 mg/dL (ref <10)

## 2022-02-08 LAB — POTASSIUM: Potassium: 4.1 mmol/L (ref 3.5–5.1)

## 2022-02-08 MED ORDER — LACTATED RINGERS IV BOLUS
1000.0000 mL | Freq: Once | INTRAVENOUS | Status: AC
  Administered 2022-02-08: 1000 mL via INTRAVENOUS

## 2022-02-08 MED ORDER — NAPROXEN 375 MG PO TABS: 375.0000 mg | ORAL_TABLET | Freq: Two times a day (BID) | ORAL | 0 refills | Status: AC

## 2022-02-08 NOTE — Discharge Instructions (Addendum)
I am concerned you had a seizure today. Due to this, do not drive, operate heavy machinery, or do potentially dangerous activities by yourself, such as climbing a ladder, swimming by yourself, etc. Follow up with the neurologist as scheduled.  If you have recurrent episodes of passing out or seizures, severe headache, weakness or numbness in your arms or legs, incontinence of your bowels or bladder, or any other new/concerning symptoms then return to the ER or call 911.  You are being given Naproxen, which can help your pain. This is an NSAID, so do not take other NSAIDs such as Ibuprofen/Advil/Aleve/Motrin/Goody Powders/BC powders/Meloxicam/Diclofenac/Indomethacin and other Nonsteroidal anti-inflammatory medications. You may still take Tylenol.   Avoid alcohol, especially with the medicines that have been previously prescribed to you.

## 2022-02-08 NOTE — ED Provider Notes (Signed)
[facility] Provider Note   CSN: [id number] Arrival date & time: 02/08/22  1459     History  Chief Complaint  Patient presents with   Loss of Consciousness    Sylvia Maxwell is a 36 y.o. female.  HPI 36 year old female presents after syncope.  Patient states she was in the shower and then woke up on the ground.  Husband states that he had left and when he came home found her unconscious in the bathroom.  She was unconscious for quite some time as he called 911.  When she first woke up she was pretty confused.  Took her a while to come out of it.  She passed out on 10/10 and went to [place] long and was having a headache so she got CT angiography and MRI.  She been dealing with severe back pain for quite some time.  She tells me her back is hurting a lot currently though not particularly different.  Has been following up with orthopedics.  She has been put on a muscle relaxer in addition to her other meds and has been intermittently taking some of her husbands tramadol, most recently took 1 this morning.  She denies any chest pain, headache, abdominal pain.  No focal weakness but often has pain running down her left leg.  She has a known history of alcohol abuse but denies any alcohol use today. States her BP typically runs low.  Home Medications Prior to Admission medications   Medication Sig Start Date End Date Taking? Authorizing Provider  naproxen (NAPROSYN) 375 MG tablet Take 1 tablet (375 mg total) by mouth 2 (two) times daily. 02/08/22  Yes Sherwood Gambler, MD  chlordiazePOXIDE (LIBRIUM) 25 MG capsule '50mg'$  PO TID x 1D, then 25-'50mg'$  PO BID X 1D, then 25-'50mg'$  PO QD X 1D 01/20/22   Drenda Freeze, MD  fluticasone [facility]) 50 MCG/ACT nasal spray Place 1 spray into both nostrils daily.    [provider]  lidocaine (LIDODERM) 5 % Place 1 patch onto the skin daily. Remove & Discard patch within 12 hours or as directed by MD 02/03/22    Melynda Ripple, MD  lisdexamfetamine (VYVANSE) 40 MG capsule Take 40 mg by mouth daily. 01/01/22   [provider]  metFORMIN (GLUCOPHAGE) 500 MG tablet Take 500 mg by mouth at bedtime. 05/22/21   [provider]  methylPREDNISolone (MEDROL DOSEPAK) 4 MG TBPK tablet Take by mouth daily. Follow package instructions 02/03/22   Melynda Ripple, MD  metoCLOPramide (REGLAN) 10 MG tablet Take 1 tablet (10 mg total) by mouth every 6 (six) hours as needed for nausea (nausea/headache). 01/20/22   Drenda Freeze, MD  Multiple Vitamin (MULTIVITAMIN) tablet Take 1 tablet by mouth daily.    [provider]  tiZANidine (ZANAFLEX) 4 MG tablet Take 1 tablet (4 mg total) by mouth every 8 (eight) hours as needed for muscle spasms. 02/03/22   Melynda Ripple, MD  vitamin B-12 (CYANOCOBALAMIN) 500 MCG tablet Take 500 mcg by mouth daily.    [provider]      Allergies    Lactose intolerance (gi)    Review of Systems   Review of Systems  Cardiovascular:  Negative for chest pain.  Gastrointestinal:  Negative for abdominal pain.  Musculoskeletal:  Positive for back pain.  Neurological:  Positive for syncope. Negative for headaches.    Physical Exam Updated Vital Signs BP (!) 113/90   Pulse 70   Temp 97.7 F (36.5 C) (  Oral)   Resp 18   Ht '5\' 6"'$  (1.676 m)   Wt 76.7 kg   LMP 01/03/2022   SpO2 97%   BMI 27.29 kg/m  Physical Exam Vitals and nursing note reviewed.  Constitutional:      Appearance: She is well-developed. She is not ill-appearing or diaphoretic.     Comments: Acutely intoxicated  HENT:     Head: Normocephalic and atraumatic.     Mouth/Throat:     Comments: No obvious tongue injury Eyes:     Extraocular Movements: Extraocular movements intact.     Comments: +nystagmus  Cardiovascular:     Rate and Rhythm: Normal rate and regular rhythm.     Heart sounds: Normal heart sounds. No murmur heard. Pulmonary:     Effort: Pulmonary effort is  normal.     Breath sounds: Normal breath sounds.  Abdominal:     General: There is no distension.     Palpations: Abdomen is soft.     Tenderness: There is no abdominal tenderness.  Musculoskeletal:     Cervical back: No rigidity.  Skin:    General: Skin is warm and dry.  Neurological:     Mental Status: She is alert.     Comments: CN 3-12 grossly intact. 5/5 strength in all 4 extremities. Grossly normal sensation. Normal finger to nose.      ED Results / Procedures / Treatments   Labs (all labs ordered are listed, but only abnormal results are displayed) Labs Reviewed  COMPREHENSIVE METABOLIC PANEL - Abnormal; Notable for the following components:      Result Value   Glucose, Bld 140 (*)    Calcium 8.0 (*)    Total Protein 5.7 (*)    Albumin 3.2 (*)    AST 55 (*)    All other components within normal limits  ETHANOL - Abnormal; Notable for the following components:   Alcohol, Ethyl (B) 412 (*)    All other components within normal limits  CBC WITH DIFFERENTIAL/PLATELET - Abnormal; Notable for the following components:   RBC 3.71 (*)    Neutro Abs 1.5 (*)    All other components within normal limits  MAGNESIUM  POTASSIUM  RAPID URINE DRUG SCREEN, HOSP PERFORMED  URINALYSIS, ROUTINE W REFLEX MICROSCOPIC  I-STAT BETA HCG BLOOD, ED ([facility], [facility], [facility] ONLY)    EKG ED ECG REPORT   Date: 02/08/2022  Rate: 66  Rhythm: normal sinus rhythm  QRS Axis: normal  Intervals: normal  ST/T Wave abnormalities: normal  Conduction Disutrbances:none  Narrative Interpretation:   Old EKG Reviewed: unchanged  I have personally reviewed the EKG tracing and agree with the computerized printout as noted.     Radiology No results found.  Procedures Procedures    Medications Ordered in ED Medications  lactated ringers bolus 1,000 mL (0 mLs Intravenous Stopped 02/08/22 1639)  lactated ringers bolus 1,000 mL (0 mLs Intravenous Stopped 02/08/22 1924)    ED Course/ Medical Decision  Making/ A&P                           Medical Decision Making Amount and/or Complexity of Data Reviewed Independent Historian: spouse External Data Reviewed: notes. Labs: ordered.    Details: EtOH very elevated at 400+, consistent with her presentation of acute alcohol intoxication. Potassium 4.1, recheck due to the hemolysis seen.  Otherwise labs are overall unremarkable besides slight AST abnormalities to go along with her alcohol abuse as well  as mild hypocalcemia. ECG/medicine tests: ordered and independent interpretation performed.    Details: No significant arrhythmia or ischemia  Risk Prescription drug management.   Patient likely either had a seizure based on description or was altered/syncopal due to the combination of tramadol and alcohol.  However husband states that she has had seizures in the past and she reports her most recent seizure was about 5 years ago and has been attributed to alcohol.  She is no longer on Wellbutrin despite that being still in her medicine list.  I discussed that the tramadol itself may lead to lower seizure threshold.  I did briefly discussed with Dr. Quinn Axe and given this presentation she recommends no antiepileptics but follow-up with neurology for an outpatient EEG.  Patient just had an MRI with and without contrast of her brain earlier this month that was unremarkable.  She is intoxicated but not altered and so I do not think repeat CNS imaging is warranted.  At the time of discharge she is now clinically sober.  She urinated but did not give [place] a sample and states no one told her she needed a sample.  However she denies UTI symptoms.  For her back pain, she is showing no signs or symptoms of spinal cord emergency on this presentation today.  I do not think an emergent MRI is needed though she does have an outpatient one scheduled.  Otherwise she is asking for something for pain.  We discussed she cannot take the tramadol given the possible seizures.  I  do not think narcotics are warranted given there is a concern in her chart for previous drug abuse as well as her alcohol abuse.  She has run out of the Mobic so we will prescribe NSAIDs and recommend she follow-up with her PCP and Ortho.  She is due to follow-up with neurology on 11/7 and she should keep this appointment.  Otherwise we counseled about not driving given the possible seizure.  Otherwise, soft blood pressures have improved and were likely from dehydration/alcohol.  She appears stable for discharge.        Final Clinical Impression(s) / ED Diagnoses Final diagnoses:  Syncope and collapse  Alcoholic intoxication with complication (HCC)  Acute low back pain with left-sided sciatica, unspecified back pain laterality    Rx / DC Orders ED Discharge Orders          Ordered    naproxen (NAPROSYN) 375 MG tablet  2 times daily        02/08/22 1933              Sherwood Gambler, MD 02/08/22 2049

## 2022-02-08 NOTE — ED Notes (Signed)
Pt very lethargic at this time. Pt placed on 2L of oxygen via nasal cannula for support.  Currently SpO2 98%.

## 2022-02-08 NOTE — ED Notes (Signed)
Critical ETOH 412 mg/dL.  MD Regenia Skeeter notified and aware.  No new orders at this time.

## 2022-02-08 NOTE — ED Triage Notes (Signed)
Pt BIB [facility] from home due to being found unresponsive on bathroom by husband.  Pt has had a previous syncopal episode a week ago with back pain.  Pt has taken tramadol and muscle relaxers.  VS 98/66, NSR and 252m NS.  20g left AC.

## 2022-02-08 NOTE — ED Notes (Signed)
CMP recollected and sent to lab.  No new orders at this time.

## 2022-02-12 ENCOUNTER — Ambulatory Visit: Payer: 59 | Admitting: Family

## 2022-02-14 ENCOUNTER — Inpatient Hospital Stay: Admission: RE | Admit: 2022-02-14 | Payer: 59 | Source: Ambulatory Visit

## 2022-02-14 ENCOUNTER — Encounter: Payer: Self-pay | Admitting: Orthopedic Surgery

## 2022-02-16 ENCOUNTER — Telehealth: Payer: Self-pay | Admitting: Orthopedic Surgery

## 2022-02-16 DIAGNOSIS — M5416 Radiculopathy, lumbar region: Secondary | ICD-10-CM

## 2022-02-16 NOTE — Telephone Encounter (Signed)
Pt called about an update concerning a peer to peer. Please call pt at [phone].

## 2022-02-16 NOTE — Telephone Encounter (Signed)
Pt's husband Hank called stating that his wife was denied for MRI due to not enough info from Dr. Laurance Flatten. Pt's husband states if Dr Laurance Flatten calls the insurance company at [phone] with information needed that the insurance company is asking for and tell them to put a rush on it. The pt's insurance company will approve the MRI. Please call the insurance company ay [phone]. Please call pt's husband Hank and let them know that it Dr Laurance Flatten called so pt can get approved for MRI. Pt phone number is [phone].

## 2022-02-16 NOTE — Progress Notes (Unsigned)
Referring:  Drenda Freeze, MD [facility],  [place] [address]  PCP: Marliss Coots, NP  Neurology was asked to evaluate Sylvia Maxwell, a 36 year old female for a chief complaint of headaches.  Our recommendations of care will be communicated by shared medical record.    CC:  headaches  History provided from self  HPI:  Medical co-morbidities: polysubstance abuse, seizure 2/2 alcohol withdrawal, depression, kidney stones, hypotension and syncope  The patient presents for evaluation of headaches which began several years ago. She has headaches daily with one severe migraine every 1-2 weeks. Migraines are described as throbbing pain with associated photophobia, phonophobia, nausea, and vomiting. She has had one episode where the migraine was associated with black spots in her vision. Migraines can last up to 2 days at a time.  She recently presented to the ED 02/08/22 for unresponsiveness. She lost consciousness in the shower and was found by her husband. She was confused for a while afterwards. Does not remember the ambulance ride but remembers waking up in the hospital. She had a severe headache after waking up. EtOH level was elevated in the ED. She notes that she "mixed some things" at the time and believes this is why she lost consciousness. Notes that she has had 5 seizures in the past which were suspected to be secondary to alcohol withdrawal.  MRI brain/C-spine and CTA head/neck on 01/20/22 were unremarkable.  Headache History: Onset: several years ago Aura: blurry, black spots in her vision Quality: throbbing Associated Symptoms:  Photophobia: yes  Phonophobia: yes  Nausea: yes Vomiting: yes Worse with activity?: yes Duration of headaches: up to 2 days  Headache days per month: 30 Migraine days per month: 2 Headache free days per month: 0  Current Treatment: Abortive Ibuprofen aleve  Preventative none  Prior Therapies                                  Ibuprofen Aleve Excedrin - jittery Prozac Topamax contraindicated due to kidney stones Propranolol contraindicated due to hypotension and syncope   LABS: CBC    Component Value Date/Time   WBC 4.2 02/08/2022 1508   RBC 3.71 (L) 02/08/2022 1508   HGB 12.3 02/08/2022 1508   HGB 14.2 01/02/2022 0950   HCT 36.4 02/08/2022 1508   HCT 42.7 01/02/2022 0950   PLT 160 02/08/2022 1508   PLT 200 01/02/2022 0950   MCV 98.1 02/08/2022 1508   MCV 100 (H) 01/02/2022 0950   MCH 33.2 02/08/2022 1508   MCHC 33.8 02/08/2022 1508   RDW 12.4 02/08/2022 1508   RDW 12.4 01/02/2022 0950   LYMPHSABS 2.4 02/08/2022 1508   MONOABS 0.3 02/08/2022 1508   EOSABS 0.0 02/08/2022 1508   BASOSABS 0.0 02/08/2022 1508      Latest Ref Rng & Units 02/08/2022    4:44 PM 02/08/2022    3:08 PM 01/20/2022    4:15 PM  CMP  Glucose 70 - 99 mg/dL  140  96   BUN 6 - 20 mg/dL  13  8   Creatinine 0.44 - 1.00 mg/dL  0.47  0.41   Sodium 135 - 145 mmol/L  141  140   Potassium 3.5 - 5.1 mmol/L 4.1  4.6  3.9   Chloride 98 - 111 mmol/L  107  100   CO2 22 - 32 mmol/L  26  28   Calcium 8.9 -  10.3 mg/dL  8.0  8.9   Total Protein 6.5 - 8.1 g/dL  5.7  7.6   Total Bilirubin 0.3 - 1.2 mg/dL  0.7  0.6   Alkaline Phos 38 - 126 U/L  52  60   AST 15 - 41 U/L  55  38   ALT 0 - 44 U/L  35  40     IMAGING:  CTA head/neck 01/20/22: unremarkable  MRI brain/C-spine 01/20/22: 1. Negative MRI head with contrast. 2. Negative MRI cervical spine with contrast. Normal cervical spinal cord.  Imaging independently reviewed on February 17, 2022   Current Outpatient Medications on File Prior to Visit  Medication Sig Dispense Refill   fluticasone (FLONASE) 50 MCG/ACT nasal spray Place 1 spray into both nostrils daily.     lisdexamfetamine (VYVANSE) 40 MG capsule Take 40 mg by mouth daily.     metFORMIN (GLUCOPHAGE) 500 MG tablet Take 500 mg by mouth at bedtime.     Multiple Vitamin (MULTIVITAMIN) tablet Take 1 tablet by mouth  daily.     naproxen (NAPROSYN) 375 MG tablet Take 1 tablet (375 mg total) by mouth 2 (two) times daily. 20 tablet 0   tiZANidine (ZANAFLEX) 4 MG tablet Take 1 tablet (4 mg total) by mouth every 8 (eight) hours as needed for muscle spasms. 30 tablet 0   vitamin B-12 (CYANOCOBALAMIN) 500 MCG tablet Take 500 mcg by mouth daily.     No current facility-administered medications on file prior to visit.     Allergies: Allergies  Allergen Reactions   Lactose Intolerance (Gi) Diarrhea and Nausea And Vomiting    Depends on the type of dairy-based product consumed as to which reaction occurs    Family History: Migraine or other headaches in the family:  no Aneurysms in a first degree relative:  no Brain tumors in the family:  no Other neurological illness in the family:   no  Past Medical History: Past Medical History:  Diagnosis Date   Alcoholism ([place])    Bronchitis    Depression    Hepatitis    ? carrier  .01- HEP C - was told when she tried to give blood   Pneumonia    walking pneumonia   aspiriation Pneumonia 05/2017   Seizures ([place])    alcohol withdrawl    Past Surgical History Past Surgical History:  Procedure Laterality Date   BREAST ENHANCEMENT SURGERY  2008   DILATION AND EVACUATION N/A 04/15/2018   Procedure: DILATATION AND EVACUATION;  Surgeon: Osborne Oman, MD;  Location: [facility];  Service: Gynecology;  Laterality: N/A;   OPEN REDUCTION, INTERNAL FIXATION (ORIF) CALCANEAL FRACTURE WITH FUSION Left 09/27/2018   Procedure: OPEN REDUCTION, INTERNAL FIXATION (ORIF) CALCANEAL FRACTURE AND EXPLORE PERONEAL TENDONS;  Surgeon: Erle Crocker, MD;  Location: [facility];  Service: Orthopedics;  Laterality: Left;    Social History: Social History   Tobacco Use   Smoking status: Former    Years: 10.00    Types: Cigarettes    Quit date: 05/2017    Years since quitting: 4.7   Smokeless tobacco: Never  Vaping Use   Vaping Use: Never used  Substance Use Topics   Alcohol  use: Yes   Drug use: Not Currently    Comment:  Meth, cocaine - 05/2017    ROS: Negative for fevers, chills. Positive for headaches, syncope. All other systems reviewed and negative unless stated otherwise in HPI.   Physical Exam:   Vital Signs: BP (!) 128/92  Pulse 82   Ht '5\' 6"'$  (1.676 m)   Wt 168 lb (76.2 kg)   LMP 01/03/2022   BMI 27.12 kg/m  GENERAL: well appearing,in no acute distress,alert SKIN:  Color, texture, turgor normal. No rashes or lesions HEAD:  Normocephalic/atraumatic. CV:  RRR RESP: Normal respiratory effort MSK: no tenderness to palpation over occiput, neck, or shoulders  NEUROLOGICAL: Mental Status: Alert, oriented to person, place and time,Follows commands Cranial Nerves: PERRL, visual fields intact to confrontation, extraocular movements intact, facial sensation intact, no facial droop or ptosis, hearing grossly intact, no dysarthria Motor: muscle strength 5/5 both upper and lower extremities, fine tremor present bilateral upper extremities Reflexes: 2+ throughout Sensation: intact to light touch all 4 extremities Coordination: Finger-to- nose-finger intact bilaterally Gait: normal-based   IMPRESSION: 36 year old female with a history of polysubstance abuse, seizure 2/2 alcohol withdrawal, depression, kidney stones, hypotension and syncope who presents for evaluation of migraines. MRI brain and CTA head/neck last month were unremarkable. Will start Imitrex for migraine rescue and low dose Cymbalta for prevention. Would avoid TCAs for now given concern for possible seizures. Suspect her seizures are secondary to alcohol withdrawal, however given frequency of seizures will order EEG to assess for underlying epileptogenicity. Counseled her on interaction of alcohol with her medications. She reports that she is working on decreasing her alcohol use.  PLAN: -Prevention: Start Cymbalta 20 mg daily -Rescue: Start Imitrex 50 mg PRN -EEG -Next steps: consider  CGRP  I spent a total of 37 minutes chart reviewing and counseling the patient. Headache education was done. Discussed treatment options including preventive and acute medications. Discussed medication side effects, adverse reactions and drug interactions. Written educational materials and patient instructions outlining all of the above were given.  Follow-up: 3 months   Genia Harold, MD 02/17/2022   9:56 AM

## 2022-02-16 NOTE — Telephone Encounter (Signed)
I called and lmom ---please see MyChart message.

## 2022-02-17 ENCOUNTER — Encounter: Payer: Self-pay | Admitting: Psychiatry

## 2022-02-17 ENCOUNTER — Ambulatory Visit (INDEPENDENT_AMBULATORY_CARE_PROVIDER_SITE_OTHER): Payer: 59 | Admitting: Psychiatry

## 2022-02-17 VITALS — BP 128/92 | HR 82 | Ht 66.0 in | Wt 168.0 lb

## 2022-02-17 DIAGNOSIS — R569 Unspecified convulsions: Secondary | ICD-10-CM | POA: Diagnosis not present

## 2022-02-17 DIAGNOSIS — G43109 Migraine with aura, not intractable, without status migrainosus: Secondary | ICD-10-CM

## 2022-02-17 MED ORDER — DULOXETINE HCL 20 MG PO CPEP: 20.0000 mg | ORAL_CAPSULE | Freq: Every day | ORAL | 6 refills | Status: DC

## 2022-02-17 MED ORDER — SUMATRIPTAN SUCCINATE 50 MG PO TABS: 50.0000 mg | ORAL_TABLET | ORAL | 5 refills | Status: AC | PRN

## 2022-02-17 NOTE — Patient Instructions (Signed)
Plan: -EEG to look for seizures -Start sumatriptan (Imitrex) as needed for migraines. Take at the onset of migraine. If headache recurs or does not fully resolve, you may take a second dose after 2 hours. Please avoid taking more than 2 days per week or 10 days per month to avoid rebound headaches -Start Cymbalta daily for headache prevention

## 2022-02-18 ENCOUNTER — Encounter: Payer: Self-pay | Admitting: Orthopedic Surgery

## 2022-02-18 ENCOUNTER — Telehealth: Payer: Self-pay | Admitting: *Deleted

## 2022-02-18 NOTE — Telephone Encounter (Signed)
-----   Message from [facility], [place] sent at 02/18/2022 12:58 PM EST ----- Juluis Rainier--- I called and lmom for pt ro advised her of Dr. Tawanna Sat message.   ----- Message ----- From: Callie Fielding, MD Sent: 02/18/2022  11:40 AM EST To: Doretha Imus, RT  I talked with her insurance company. They require six weeks of physician directed treatment before they will approve an MRI. She should continue her NSAID use and I will refer her to physical therapy. I will need to see her back in 4 weeks and reevaluate her. If she is doing no better or is worse at that time, they would approve the MRI

## 2022-02-18 NOTE — Telephone Encounter (Signed)
Pt is scheduled today at 1130am with Dr. Wilford Corner for P2P.

## 2022-02-18 NOTE — Progress Notes (Signed)
Spoke with patient's health insurance provider about my recommendation for lumbar spine MRI. Per their guidelines, she needs to do six weeks of physician directed treatment before an MRI would be approved. She has now been getting treatment with me for 2 weeks so she will need to do 4 more weeks of conservative treatment before an MRI would be approved. I will refer her to physical therapy to try to get her relief with something besides NSAIDs. I will want to see her back in 4 weeks to see how she is doing. If she has not improved or is doing worse, will recommend MRI at that time.   Callie Fielding, MD Orthopedic Spine Surgeon

## 2022-02-19 ENCOUNTER — Ambulatory Visit (INDEPENDENT_AMBULATORY_CARE_PROVIDER_SITE_OTHER): Payer: 59 | Admitting: Gastroenterology

## 2022-02-19 ENCOUNTER — Encounter: Payer: Self-pay | Admitting: Psychiatry

## 2022-02-19 DIAGNOSIS — Z23 Encounter for immunization: Secondary | ICD-10-CM

## 2022-02-19 NOTE — Progress Notes (Signed)
Per orders of Dr. Allen Norris, injection of Hep A/B 2 of 3 given in Right Deltoid by Lurlean Nanny.  Patient tolerated injection well.

## 2022-02-23 ENCOUNTER — Encounter: Payer: Self-pay | Admitting: Neurology

## 2022-02-23 NOTE — Telephone Encounter (Signed)
I recommend she avoid driving or operating other heavy machinery until we can get the EEG done. She's okay to return to work as long as she avoids these activities.

## 2022-02-25 ENCOUNTER — Ambulatory Visit (INDEPENDENT_AMBULATORY_CARE_PROVIDER_SITE_OTHER): Payer: 59 | Admitting: Neurology

## 2022-02-25 DIAGNOSIS — R569 Unspecified convulsions: Secondary | ICD-10-CM | POA: Diagnosis not present

## 2022-02-25 NOTE — Procedures (Signed)
    History:  36 with alcohol withdrawal seizure  EEG classification: Awake and drowsy  Description of the recording: The background rhythms of this recording consists of a fairly well modulated medium amplitude alpha rhythm of 9 Hz that is reactive to eye opening and closure. Present in the anterior head region is a 15-20 Hz beta activity. Photic stimulation was performed, did not show any abnormalities. Hyperventilation was also performed, did not show any abnormalities. Drowsiness was manifested by background fragmentation. No abnormal epileptiform discharges seen during this recording. There was no focal slowing. There were no electrographic seizure identified.   Abnormality: None   Impression: This is a normal EEG recorded while drowsy and awake. No evidence of interictal epileptiform discharges. Normal EEGs, however, do not rule out epilepsy.    Alric Ran, MD [facility]

## 2022-02-26 ENCOUNTER — Encounter: Payer: Self-pay | Admitting: Neurology

## 2022-02-26 NOTE — Telephone Encounter (Signed)
She can return to work without restrictions. I'm okay with her driving since the EEG was negative

## 2022-03-02 NOTE — Therapy (Signed)
OUTPATIENT PHYSICAL THERAPY THORACOLUMBAR EVALUATION   Patient Name: Sylvia Maxwell MRN: [id number] DOB:March 10, 1986, 36 y.o., female Today's Date: 03/03/2022  END OF SESSION:  PT End of Session - 03/03/22 1104     Visit Number 1    Number of Visits 17    Date for PT Re-Evaluation 05/15/22    PT Start Time 1105    PT Stop Time 1145    PT Time Calculation (min) 40 min    Activity Tolerance Patient tolerated treatment well    Behavior During Therapy WFL for tasks assessed/performed             Past Medical History:  Diagnosis Date   Alcoholism ([place])    Bronchitis    Depression    Hepatitis    ? carrier  .01- HEP C - was told when she tried to give blood   Pneumonia    walking pneumonia   aspiriation Pneumonia 05/2017   Seizures ([place])    alcohol withdrawl   Past Surgical History:  Procedure Laterality Date   BREAST ENHANCEMENT SURGERY  2008   DILATION AND EVACUATION N/A 04/15/2018   Procedure: DILATATION AND EVACUATION;  Surgeon: Osborne Oman, MD;  Location: [facility];  Service: Gynecology;  Laterality: N/A;   OPEN REDUCTION, INTERNAL FIXATION (ORIF) CALCANEAL FRACTURE WITH FUSION Left 09/27/2018   Procedure: OPEN REDUCTION, INTERNAL FIXATION (ORIF) CALCANEAL FRACTURE AND EXPLORE PERONEAL TENDONS;  Surgeon: Erle Crocker, MD;  Location: [facility];  Service: Orthopedics;  Laterality: Left;   Patient Active Problem List   Diagnosis Date Noted   Easy bruising 01/01/2022   High serum ferritin 01/01/2022   Abnormal liver function test 01/01/2022   Chronic active hepatitis ([place]) 04/20/2021   Adult ADHD (attention deficit hyperactivity disorder) 03/17/2021   Neoplasm of uncertain behavior of skin of face 03/09/2021   Body mass index (BMI) of 30.0-30.9 in adult 03/09/2021   Alcohol-induced mood disorder ([place]) 08/24/2018   Tobacco dependence 04/25/2018   Retained products of conception after miscarriage 04/11/2018   Polysubstance (including opioids) dependence with physiol  dependence ([place]) 05/22/2017   Major depressive disorder, single episode, severe ([place]) 05/22/2017   Heroin overdose ([place]) 05/20/2017   Alcohol dependence with withdrawal with complication ([place]) 22/63/3354   Alcohol withdrawal seizure ([place]) 06/13/2016   Seizure ([place])    ETOH abuse 06/12/2016   Alcohol withdrawal seizure without complication ([place]) 56/25/6389    PCP:  Ronnell Freshwater, NP   REFERRING PROVIDER: Callie Fielding, MD   REFERRING DIAG: M54.16 (ICD-10-CM) - Radiculopathy, lumbar region  Rationale for Evaluation and Treatment: Rehabilitation  THERAPY DIAG:  Other low back pain  Muscle weakness (generalized)  Difficulty in walking, not elsewhere classified  ONSET DATE: 3 months ago  SUBJECTIVE:  SUBJECTIVE STATEMENT: Pt arriving this morning reporting 3 month history of low back pain with numbness and tingling down her left LE into low leg.  Pt stating walking is worse than standing or sitting. Pt sitting with Rt lateral lean to decreased pressure down left LE.   PERTINENT HISTORY:  Depression, seizures, h/o pneumonia  PAIN:  NPRS scale: 7/10 Pain location: low back Pain description: sharp, tingling and stabbing Aggravating factors: walking, sitting prolonged Relieving factors: NSAIDS  PRECAUTIONS: None  WEIGHT BEARING RESTRICTIONS: No  FALLS:  Has patient fallen in last 6 months? Yes. Number of falls 1  LIVING ENVIRONMENT: Lives with: lives with their family and lives with their spouse Lives in: House/apartment Stairs: 4-5 from garage with rail on left Has following equipment at home:  None  OCCUPATION: works at [employer]: [place]: Stop hurting, walk without pain  Next MD Visit: 3 weeks   OBJECTIVE:   DIAGNOSTIC FINDINGS:  MRI to be scheduled following PT  PATIENT SURVEYS:  03/03/22: FOTO eval: 42%  SCREENING FOR RED FLAGS: Bowel or bladder incontinence: No Cauda equina syndrome: No  COGNITION: Overall cognitive status: WFL normal      SENSATION: WFL  MUSCLE LENGTH: Hamstrings: Right 100 deg; Left 95 deg   POSTURE: rounded shoulders and forward head  PALPATION: TTP: lower thoracic and lumbar paraspinals, left QL and left piriformis  LUMBAR ROM:   AROM 03/03/22  Flexion 30 deg limited by pain  Extension 18 limited by pain  Right lateral flexion 38 deg  Left lateral flexion 24 limited by pain  Right rotation [facility]   Left rotation WFL pain reported   (Blank rows = not tested)  LOWER EXTREMITY ROM:     Active  Right 03/03/22 Left 03/03/22  Hip flexion    Hip extension    Hip abduction    Hip adduction    Hip internal rotation    Hip external rotation    Knee flexion    Knee extension    Ankle dorsiflexion    Ankle plantarflexion    Ankle inversion    Ankle eversion     (Blank rows = not tested)  LOWER EXTREMITY MMT:    MMT Right 03/03/22 Left 03/03/22  Hip flexion 5/5 5/5  Hip extension    Hip abduction 5/5 5/5  Hip adduction 5/5 5/5  Hip internal rotation    Hip external rotation    Knee flexion 5/5 5/5  Knee extension 5/5 5/5  Ankle dorsiflexion    Ankle plantarflexion    Ankle inversion    Ankle eversion     (Blank rows = not tested)  LUMBAR SPECIAL TESTS:  03/03/22:  Slump test: positive on left   FUNCTIONAL TESTS:  03/03/22: 5 times sit to stand: 18 seconds no UE support  GAIT: 03/03/22:  Distance walked: 30 feet  Assistive device utilized: None Level of assistance: Complete Independence Comments: mild antalgic gait   TODAY'S TREATMENT:  03/03/22:   Therex:  HEP instruction/performance c cues for techniques, handout provided.  Trial set performed of each for comprehension and symptom assessment.  See below for exercise list  PATIENT EDUCATION:  Education details: HEP, POC Person educated: Patient Education method: Explanation, Demonstration, Verbal cues, and Handouts Education comprehension: verbalized understanding, returned demonstration, and verbal cues required  HOME EXERCISE PROGRAM: Access Code: [email or web address] URL: [email or web address] Date: 03/03/2022 Prepared by: Kearney Hard  Exercises - Supine Posterior Pelvic Tilt  - 2 x daily - 7 x weekly - 2 sets - 10 reps - 5 seconds hold - Supine Quadratus Lumborum Stretch  - 2 x daily - 7 x weekly - 3 reps - 20 seconds hold - Supine Figure 4 Piriformis Stretch  - 2 x daily - 7 x weekly - 3 reps - 20 seconds hold - Standing Lumbar Extension at Wall - Forearms  - 2 x daily - 7 x weekly - 10 reps - 5-10 seconds hold  ASSESSMENT:  CLINICAL IMPRESSION: Patient is a 36 y.o. who comes to clinic with complaints of low back pain with radiculopathy. Pt presents  with mobility, strength and movement coordination deficits that impair their ability to perform usual daily and recreational functional activities without increase difficulty/symptoms at this time.  Patient to benefit from skilled PT services to address impairments and limitations to improve to previous level of function without restriction secondary to condition.   OBJECTIVE IMPAIRMENTS: decreased activity tolerance, decreased mobility, difficulty walking, and decreased ROM.   ACTIVITY LIMITATIONS: lifting, bending, sitting, standing, squatting, and sleeping  PARTICIPATION LIMITATIONS: cleaning, laundry, community activity, and occupation  PERSONAL FACTORS:  see pertinent history above  are also affecting patient's  functional outcome.   REHAB POTENTIAL: Good  CLINICAL DECISION MAKING: Stable/uncomplicated  EVALUATION COMPLEXITY: Low   GOALS: Goals reviewed with patient? Yes  SHORT TERM GOALS: (target date for Short term goals are 3 weeks 03/27/22)  1. Patient will demonstrate independent use of home exercise program to maintain progress from in clinic treatments.  Goal status: New  LONG TERM GOALS: (target dates for all long term goals are 8 weeks   )   1. Patient will demonstrate/report pain at worst less than or equal to 2/10 to facilitate minimal limitation in daily activity secondary to pain symptoms.  Goal status: New   2. Patient will demonstrate independent use of home exercise program to facilitate ability to maintain/progress functional gains from skilled physical therapy services.  Goal status: New   3. Patient will demonstrate FOTO outcome > or = 61 % to indicate reduced disability due to condition.  Goal status: New   4. Patient will demonstrate lumbar extension to > 25 degrees with no pain.   Goal status: New   5.  Pt will be able to demonstrate using correct body mechanics picking up 10# weight from floor and placing on overhead shelf with pain </= 2/10 in her low back.    Goal status: New   6.  Pt will improve her 5 time sit to stand to </= 12 seconds with no UE support.  Goal status: New     PLAN:  PT FREQUENCY: 1-2x/week  PT DURATION: 10 weeks  PLANNED INTERVENTIONS: Therapeutic exercises, Therapeutic activity, Neuro Muscular re-education, Balance training, Gait training, Patient/Family education, Joint mobilization, Stair training, DME instructions, Dry Needling, Electrical stimulation, Cryotherapy, vasopneumatic device, Moist heat, Taping, Traction Ultrasound, Ionotophoresis '4mg'$ /ml Dexamethasone, and Manual therapy.  All included unless contraindicated  PLAN FOR NEXT SESSION: Review HEP knowledge/results,  lumbar mobs as needed, core stabilization and  strengthening exercises, consider DN      Oretha Caprice, PT, MPT 03/03/2022, 12:47 PM

## 2022-03-03 ENCOUNTER — Encounter: Payer: Self-pay | Admitting: Physical Therapy

## 2022-03-03 ENCOUNTER — Ambulatory Visit (INDEPENDENT_AMBULATORY_CARE_PROVIDER_SITE_OTHER): Payer: 59 | Admitting: Physical Therapy

## 2022-03-03 ENCOUNTER — Telehealth: Payer: Self-pay | Admitting: Physical Therapy

## 2022-03-03 DIAGNOSIS — M6281 Muscle weakness (generalized): Secondary | ICD-10-CM | POA: Diagnosis not present

## 2022-03-03 DIAGNOSIS — M5459 Other low back pain: Secondary | ICD-10-CM

## 2022-03-03 DIAGNOSIS — R262 Difficulty in walking, not elsewhere classified: Secondary | ICD-10-CM | POA: Diagnosis not present

## 2022-03-03 NOTE — Telephone Encounter (Signed)
I called to follow up with pt  due to her not getting a print out of her HEP. I left the name of our free app and the code for pt get online access to her exercises recommended at our evaluation this morning.   App: [email or web address] Access Code: [email or web address]  Kearney Hard, PT, MPT 03/03/22 3:36 PM

## 2022-03-16 ENCOUNTER — Ambulatory Visit: Payer: 59 | Admitting: Orthopedic Surgery

## 2022-03-16 DIAGNOSIS — M5416 Radiculopathy, lumbar region: Secondary | ICD-10-CM

## 2022-03-16 MED ORDER — TIZANIDINE HCL 4 MG PO TABS: 4.0000 mg | ORAL_TABLET | Freq: Three times a day (TID) | ORAL | 0 refills | Status: AC | PRN

## 2022-03-16 NOTE — Addendum Note (Signed)
Addended by: Ileene Rubens on: 03/16/2022 12:03 PM   Modules accepted: Orders

## 2022-03-16 NOTE — Progress Notes (Signed)
Orthopedic Spine Surgery Office Note  Assessment: Patient is a 36 y.o. female with chronic, progressive low back pain that radiates into the left leg and into the dorsum of the foot. Possible L5 or S1 radiculopathy   Plan: -Patient has tried activity modification, medrol dosepak, NSAIDs, PT, home exercises -She has tried six weeks of home exercises and PT with worsening of her symptoms, so recommended MRI of the lumbar spine to evaluate for radiculopathy -Prescribed zanaflex since she has had relief with this in the past. She can continue with her previously prescribed gabapentin. She can continue to use aleve and tylenol.  -Patient should return to office in 3 weeks, repeat x-rays of lumbar spine at next visit: none   Patient expressed understanding of the plan and all questions were answered to the patient's satisfaction.   ___________________________________________________________________________  History: Patient is a 36 y.o. female who has been previously seen in the office for symptoms consistent with lumbar radiculopathy. Since the last visit, symptom intensity has become more severe. At the last visit, PT and home exercises was tried for treatment. Pain has gotten worse in her low back. It radiates down her left leg. She feels it in the lateral thigh, anterolateral leg, and dorsum of her foot. No symptoms on the right side. Has paresthesias and numbness in the same distribution as the pain on the left side.   Previous treatments: activity modification, medrol dosepak, NSAIDs, PT, home exercises  COPY OF FIRST NOTE Patient is a 36 y.o. female who presents today for lumbar spine. Patient has had over 6 months of low back pain. In the last several weeks, the pain has now started to radiate into her left thigh. She feels it go down her buttock into the lateral aspect of her thigh. It does not go below the knee. There is no right sided symptoms. There was no trauma or injury antecedent to  the pain. She states that standing for a prolonged period of time and laying flat aggravate the pain. Sitting makes it mildly better but she still feels it.      Weakness: yes, feels her legs are weaker Symptoms of imbalance: yes, feels she has been clumsy for years Paresthesias and numbness: denies Bowel or bladder incontinence: denies Saddle anesthesia: denies END OF COPY  Physical Exam:  General: no acute distress, appears stated age Neurologic: alert, answering questions appropriately, following commands Respiratory: unlabored breathing on room air, symmetric chest rise Psychiatric: appropriate affect, normal cadence to speech   MSK (spine):  -Strength exam      Left  Right EHL    4+/5  5/5 TA    5/5  5/5 GSC    5/5  5/5 Knee extension  5/5  5/5 Hip flexion   5/5  5/5  -Sensory exam    Sensation intact to light touch in L3-S1 nerve distributions of bilateral lower extremities  -Straight leg raise: negative -Contralateral straight leg raise: negative -Clonus: no beats bilaterally  Imaging: XR of the lumbar spine from 02/05/2022 and 02/03/2022 was previously independently reviewed and interpreted, showing minimal disc height loss at L5/S1 and L4/5. No fracture or dislocation. No evidence of instability on flexion/extension. Grade 1 spondylolisthesis at L5/S1.     Patient name: Sylvia Maxwell Patient MRN: [id number] Date of visit: 03/16/22

## 2022-03-17 ENCOUNTER — Other Ambulatory Visit: Payer: 59 | Admitting: *Deleted

## 2022-03-18 ENCOUNTER — Encounter: Payer: Self-pay | Admitting: Physical Therapy

## 2022-03-18 ENCOUNTER — Ambulatory Visit (INDEPENDENT_AMBULATORY_CARE_PROVIDER_SITE_OTHER): Payer: 59 | Admitting: Physical Therapy

## 2022-03-18 DIAGNOSIS — M5459 Other low back pain: Secondary | ICD-10-CM | POA: Diagnosis not present

## 2022-03-18 DIAGNOSIS — M6281 Muscle weakness (generalized): Secondary | ICD-10-CM | POA: Diagnosis not present

## 2022-03-18 DIAGNOSIS — R262 Difficulty in walking, not elsewhere classified: Secondary | ICD-10-CM

## 2022-03-18 NOTE — Therapy (Addendum)
OUTPATIENT PHYSICAL THERAPY TREATMENT NOTE [place]   Patient Name: Sylvia Maxwell MRN: [id number] DOB:[birth year], 36 y.o., female Today's Date: 03/18/2022  PCP: Ronnell Freshwater, NP   REFERRING PROVIDER: Callie Fielding, MD    END OF SESSION:   PT End of Session - 03/18/22 1017     Visit Number 2    Number of Visits 17    Date for PT Re-Evaluation 05/15/22    PT Start Time 0930    PT Stop Time 1012    PT Time Calculation (min) 42 min    Activity Tolerance Patient tolerated treatment well    Behavior During Therapy WFL for tasks assessed/performed             Past Medical History:  Diagnosis Date   Alcoholism ([place])    Bronchitis    Depression    Hepatitis    ? carrier  .01- HEP C - was told when she tried to give blood   Pneumonia    walking pneumonia   aspiriation Pneumonia 05/2017   Seizures ([place])    alcohol withdrawl   Past Surgical History:  Procedure Laterality Date   BREAST ENHANCEMENT SURGERY  2008   DILATION AND EVACUATION N/A 04/15/2018   Procedure: DILATATION AND EVACUATION;  Surgeon: Osborne Oman, MD;  Location: [facility];  Service: Gynecology;  Laterality: N/A;   OPEN REDUCTION, INTERNAL FIXATION (ORIF) CALCANEAL FRACTURE WITH FUSION Left 09/27/2018   Procedure: OPEN REDUCTION, INTERNAL FIXATION (ORIF) CALCANEAL FRACTURE AND EXPLORE PERONEAL TENDONS;  Surgeon: Erle Crocker, MD;  Location: [facility];  Service: Orthopedics;  Laterality: Left;   Patient Active Problem List   Diagnosis Date Noted   Easy bruising 01/01/2022   High serum ferritin 01/01/2022   Abnormal liver function test 01/01/2022   Chronic active hepatitis ([place]) 04/20/2021   Adult ADHD (attention deficit hyperactivity disorder) 03/17/2021   Neoplasm of uncertain behavior of skin of face 03/09/2021   Body mass index (BMI) of 30.0-30.9 in adult 03/09/2021   Alcohol-induced mood disorder ([place]) 08/24/2018   Tobacco dependence 04/25/2018   Retained products of conception after  miscarriage 04/11/2018   Polysubstance (including opioids) dependence with physiol dependence ([place]) 05/22/2017   Major depressive disorder, single episode, severe ([place]) 05/22/2017   Heroin overdose ([place]) 05/20/2017   Alcohol dependence with withdrawal with complication ([place]) 69/45/0388   Alcohol withdrawal seizure ([place]) 06/13/2016   Seizure ([place])    ETOH abuse 06/12/2016   Alcohol withdrawal seizure without complication ([place]) 82/80/0349    REFERRING DIAG: M54.16 (ICD-10-CM) - Radiculopathy, lumbar region    THERAPY DIAG:  Other low back pain  Muscle weakness (generalized)  Difficulty in walking, not elsewhere classified  Rationale for Evaluation and Treatment Rehabilitation  PERTINENT HISTORY: Depression, seizures, h/o pneumonia   PRECAUTIONS: none  SUBJECTIVE:  SUBJECTIVE STATEMENT:   Pt stating she saw Dr. Laurance Flatten on Monday and hopefully the MRI will get scheduled with insurance approval.    PAIN:  Are you having pain? 7/10 pain in left low back   OBJECTIVE: (objective measures completed at initial evaluation unless otherwise dated)  DIAGNOSTIC FINDINGS:  MRI to be scheduled following PT   PATIENT SURVEYS:  03/03/22: FOTO eval: 42%   SCREENING FOR RED FLAGS: Bowel or bladder incontinence: No Cauda equina syndrome: No   COGNITION: Overall cognitive status: WFL normal                                        SENSATION: WFL   MUSCLE LENGTH: Hamstrings: Right 100 deg; Left 95 deg     POSTURE: rounded shoulders and forward head   PALPATION: TTP: lower thoracic and lumbar paraspinals, left QL and left piriformis   LUMBAR ROM:    AROM 03/03/22  Flexion 30 deg limited by pain  Extension 18 limited by pain  Right lateral flexion 38 deg  Left lateral flexion 24 limited by pain   Right rotation [facility]   Left rotation WFL pain reported   (Blank rows = not tested)   LOWER EXTREMITY ROM:      Active  Right 03/03/22 Left 03/03/22  Hip flexion      Hip extension      Hip abduction      Hip adduction      Hip internal rotation      Hip external rotation      Knee flexion      Knee extension      Ankle dorsiflexion      Ankle plantarflexion      Ankle inversion      Ankle eversion       (Blank rows = not tested)   LOWER EXTREMITY MMT:     MMT Right 03/03/22 Left 03/03/22  Hip flexion 5/5 5/5  Hip extension      Hip abduction 5/5 5/5  Hip adduction 5/5 5/5  Hip internal rotation      Hip external rotation      Knee flexion 5/5 5/5  Knee extension 5/5 5/5  Ankle dorsiflexion      Ankle plantarflexion      Ankle inversion      Ankle eversion       (Blank rows = not tested)   LUMBAR SPECIAL TESTS:  03/03/22:  Slump test: positive on left     FUNCTIONAL TESTS:  03/03/22: 5 times sit to stand: 18 seconds no UE support   GAIT: 03/03/22:  Distance walked: 30 feet  Assistive device utilized: None Level of assistance: Complete Independence Comments: mild antalgic gait    TODAY'S TREATMENT:  03/18/22:  TherEx: Nustep: Level 5 x 6 minutes Supine bridges  2 x 10 holding 5 sec with PPT  SKTC x 3 each LE Piriformis stretch x 3 each LE holding 30 sec Gluteal stretch x 3 each LE holding 30 sec Prone Press on elbows: x 1 minute It band stretch: x 3 holding 30 sec Rows: Level 3 band x 20 holding 3 sec Manual:  Skilled palpation active trigger points during DN Trigger Point Dry-Needling  Treatment instructions: Expect mild to moderate muscle soreness. S/S of pneumothorax if dry needled over a lung field, and to seek immediate medical attention should they occur. Patient verbalized understanding of these instructions and  education.  Patient Consent Given: Yes Education handout provided: Yes Muscles treated: lumbar multifidi, left piriformis Treatment response/outcome: twitch response noted   03/03/22:   Therex:  HEP instruction/performance c cues for techniques, handout provided.  Trial set performed of each for comprehension and symptom assessment.  See below for exercise list   PATIENT EDUCATION:  Education details: HEP, POC Person educated: Patient Education method: Explanation, Demonstration, Verbal cues, and Handouts Education comprehension: verbalized understanding, returned demonstration, and verbal cues required   HOME EXERCISE PROGRAM: Access Code: [email or web address] URL: [email or web address] Date: 03/03/2022 Prepared by: Kearney Hard   Exercises - Supine Posterior Pelvic Tilt  - 2 x daily - 7 x weekly - 2 sets - 10 reps - 5 seconds hold - Supine Quadratus Lumborum Stretch  - 2 x daily - 7 x weekly - 3 reps - 20 seconds hold - Supine Figure 4 Piriformis Stretch  - 2 x daily - 7 x weekly - 3 reps - 20 seconds hold - Standing Lumbar Extension at Wall - Forearms  - 2 x daily - 7 x weekly - 10 reps - 5-10 seconds hold   ASSESSMENT:   CLINICAL IMPRESSION:    OBJECTIVE IMPAIRMENTS: decreased activity tolerance, decreased mobility, difficulty walking, and decreased ROM.    ACTIVITY LIMITATIONS: lifting, bending, sitting, standing, squatting, and sleeping   PARTICIPATION LIMITATIONS: cleaning, laundry, community activity, and occupation   PERSONAL FACTORS:  see pertinent history above  are also affecting patient's functional outcome.    REHAB POTENTIAL: Good   CLINICAL DECISION MAKING: Stable/uncomplicated   EVALUATION COMPLEXITY: Low     GOALS: Goals reviewed with patient? Yes   SHORT TERM GOALS: (target date for Short term goals are 3 weeks 03/27/22)   1. Patient will demonstrate independent use of home exercise program to maintain progress from in clinic  treatments.   Goal status: New   LONG TERM GOALS: (target dates for all long term goals are 8 weeks   )   1. Patient will demonstrate/report pain at worst less than or equal to 2/10 to facilitate minimal limitation in daily activity secondary to pain symptoms.   Goal status: New   2. Patient will demonstrate independent use of home exercise program to facilitate ability to maintain/progress functional gains from skilled physical therapy services.   Goal status: New   3. Patient will demonstrate FOTO outcome > or = 61 % to indicate reduced disability due to condition.   Goal status: New   4. Patient will demonstrate lumbar extension to > 25 degrees with no pain.    Goal status: New   5.  Pt will be able to demonstrate using correct body mechanics picking up 10# weight from floor and placing on overhead shelf with pain </= 2/10 in her low back.    Goal status: New  6.  Pt will improve her 5 time sit to stand to </= 12 seconds with no UE support.  Goal status: New       PLAN:   PT FREQUENCY: 1-2x/week   PT DURATION: 10 weeks   PLANNED INTERVENTIONS: Therapeutic exercises, Therapeutic activity, Neuro Muscular re-education, Balance training, Gait training, Patient/Family education, Joint mobilization, Stair training, DME instructions, Dry Needling, Electrical stimulation, Cryotherapy, vasopneumatic device, Moist heat, Taping, Traction Ultrasound, Ionotophoresis '4mg'$ /ml Dexamethasone, and Manual therapy.  All included unless contraindicated   PLAN FOR NEXT SESSION: Review HEP knowledge/results, lumbar mobs as needed, core stabilization and strengthening exercises, assess response to DN   Oretha Caprice, PT. MPT 03/18/2022, 10:27 AM   PHYSICAL THERAPY DISCHARGE SUMMARY  Visits from Start of Care: 2  Current functional level related to goals / functional outcomes: See note   Remaining deficits: See note   Education / Equipment: HEP  Patient goals were partially  met. Patient is being discharged due to not returning since the last visit.  Scot Jun, PT, DPT, OCS, ATC 05/07/22  2:08 PM

## 2022-03-27 ENCOUNTER — Ambulatory Visit
Admission: RE | Admit: 2022-03-27 | Discharge: 2022-03-27 | Disposition: A | Discharge status: Home / Self Care | Payer: 59 | Source: Ambulatory Visit | Attending: Orthopedic Surgery | Admitting: Orthopedic Surgery

## 2022-03-27 DIAGNOSIS — M5416 Radiculopathy, lumbar region: Secondary | ICD-10-CM

## 2022-04-01 ENCOUNTER — Ambulatory Visit: Payer: 59 | Admitting: Nurse Practitioner

## 2022-04-01 ENCOUNTER — Encounter: Payer: Self-pay | Admitting: Nurse Practitioner

## 2022-04-01 VITALS — BP 137/95 | HR 104 | Resp 18 | Ht 66.0 in | Wt 167.0 lb

## 2022-04-01 DIAGNOSIS — F4321 Adjustment disorder with depressed mood: Secondary | ICD-10-CM | POA: Diagnosis not present

## 2022-04-01 DIAGNOSIS — F10239 Alcohol dependence with withdrawal, unspecified: Secondary | ICD-10-CM

## 2022-04-01 DIAGNOSIS — F322 Major depressive disorder, single episode, severe without psychotic features: Secondary | ICD-10-CM

## 2022-04-01 DIAGNOSIS — Z6830 Body mass index (BMI) 30.0-30.9, adult: Secondary | ICD-10-CM

## 2022-04-01 DIAGNOSIS — M519 Unspecified thoracic, thoracolumbar and lumbosacral intervertebral disc disorder: Secondary | ICD-10-CM

## 2022-04-01 DIAGNOSIS — F909 Attention-deficit hyperactivity disorder, unspecified type: Secondary | ICD-10-CM

## 2022-04-01 MED ORDER — METFORMIN HCL 850 MG PO TABS: 850.0000 mg | ORAL_TABLET | Freq: Every day | ORAL | 3 refills | Status: DC

## 2022-04-01 NOTE — Progress Notes (Signed)
Established patient visit   Patient: Sylvia Maxwell   DOB: November 23, 1985   36 y.o. Female  MRN: [id number] Visit Date: 04/01/2022   Chief Complaint  Patient presents with   Follow-up   Sore Throat   ADHD   Subjective    HPI  Follow up -currently tearful - lost a good friend yesterday. This was sudden and unexpected.  -has seen attention specialists. --started on Azstarys and Vyvanse - started this just three days ago  -has started with orthopedic due to spinal issue  --MRI of the L-spine done earlier this week showed the following: ---IMPRESSION: 1. L5-S1 severe left and mild right facet arthropathy, with widening of the left facets, which can indicate instability. 2. L5-S1 disc bulge, the left aspect of which likely contacts the exiting left L5 nerve. 3. No spinal canal stenosis or neural foraminal narrowing. -she is scheduled for follow up with orthopedic provider 04/08/2022.    Medications: Outpatient Medications Prior to Visit  Medication Sig   AZSTARYS 39.2-7.8 MG CAPS Take 1 capsule by mouth daily.   DULoxetine (CYMBALTA) 20 MG capsule Take 1 capsule (20 mg total) by mouth daily.   fluticasone (FLONASE) 50 MCG/ACT nasal spray Place 1 spray into both nostrils daily.   lisdexamfetamine (VYVANSE) 40 MG capsule Take 40 mg by mouth daily.   Multiple Vitamin (MULTIVITAMIN) tablet Take 1 tablet by mouth daily.   naproxen (NAPROSYN) 375 MG tablet Take 1 tablet (375 mg total) by mouth 2 (two) times daily.   SUMAtriptan (IMITREX) 50 MG tablet Take 1 tablet (50 mg total) by mouth every 2 (two) hours as needed for migraine. May repeat in 2 hours if headache persists or recurs.   [EXPIRED] tiZANidine (ZANAFLEX) 4 MG tablet Take 1 tablet (4 mg total) by mouth every 8 (eight) hours as needed for up to 21 days for muscle spasms.   vitamin B-12 (CYANOCOBALAMIN) 500 MCG tablet Take 500 mcg by mouth daily.   [DISCONTINUED] metFORMIN (GLUCOPHAGE) 500 MG tablet Take 500 mg by mouth at bedtime.    No facility-administered medications prior to visit.    Review of Systems  Constitutional:  Negative for activity change, appetite change, chills, fatigue and fever.  HENT:  Positive for congestion, postnasal drip and sore throat. Negative for rhinorrhea, sinus pressure, sinus pain and sneezing.   Eyes: Negative.  Negative for redness and itching.  Respiratory:  Negative for cough, chest tightness, shortness of breath and wheezing.   Cardiovascular:  Negative for chest pain and palpitations.  Gastrointestinal:  Negative for abdominal pain, constipation, diarrhea, nausea and vomiting.  Endocrine: Negative for cold intolerance, heat intolerance, polydipsia and polyuria.  Genitourinary:  Negative for dyspareunia, dysuria, flank pain, frequency and urgency.  Musculoskeletal:  Positive for back pain and myalgias. Negative for arthralgias.  Skin:  Negative for rash.  Allergic/Immunologic: Positive for environmental allergies.  Neurological:  Positive for headaches. Negative for dizziness and weakness.  Hematological:  Negative for adenopathy. Bruises/bleeds easily.  Psychiatric/Behavioral:  Positive for decreased concentration and dysphoric mood. The patient is nervous/anxious.        Very sad today after sudden loss off a good friend yesterday     Last CBC Lab Results  Component Value Date   WBC 4.2 02/08/2022   HGB 12.3 02/08/2022   HCT 36.4 02/08/2022   MCV 98.1 02/08/2022   MCH 33.2 02/08/2022   RDW 12.4 02/08/2022   PLT 160 14/97/0263   Last metabolic panel Lab Results  Component Value Date   GLUCOSE  140 (H) 02/08/2022   NA 141 02/08/2022   K 4.1 02/08/2022   CL 107 02/08/2022   CO2 26 02/08/2022   BUN 13 02/08/2022   CREATININE 0.47 02/08/2022   GFRNONAA >60 02/08/2022   CALCIUM 8.0 (L) 02/08/2022   PHOS 2.6 05/21/2017   PROT 5.7 (L) 02/08/2022   ALBUMIN 3.2 (L) 02/08/2022   LABGLOB 2.4 01/02/2022   AGRATIO 1.9 01/02/2022   BILITOT 0.7 02/08/2022   ALKPHOS 52  02/08/2022   AST 55 (H) 02/08/2022   ALT 35 02/08/2022   ANIONGAP 8 02/08/2022   Last lipids Lab Results  Component Value Date   CHOL 147 03/24/2021   HDL 94 03/24/2021   LDLCALC 27 03/24/2021   TRIG 170 (H) 03/24/2021   CHOLHDL 1.6 03/24/2021   Last hemoglobin A1c Lab Results  Component Value Date   HGBA1C 5.3 03/24/2021   Last thyroid functions Lab Results  Component Value Date   TSH 1.110 03/24/2021   Last vitamin D Lab Results  Component Value Date   VD25OH 95.2 03/24/2021       Objective     Today's Vitals   04/01/22 0853  BP: (Abnormal) 137/95  Pulse: (Abnormal) 104  Resp: 18  SpO2: 97%  Weight: 167 lb (75.8 kg)  Height: '5\' 6"'$  (1.676 m)   Body mass index is 26.95 kg/m.  BP Readings from Last 3 Encounters:  04/13/22 121/81  04/01/22 (Abnormal) 137/95  02/17/22 (Abnormal) 128/92    Wt Readings from Last 3 Encounters:  04/01/22 167 lb (75.8 kg)  02/17/22 168 lb (76.2 kg)  02/08/22 169 lb 1.5 oz (76.7 kg)    Physical Exam Vitals and nursing note reviewed.  Constitutional:      Appearance: Normal appearance. She is well-developed.  HENT:     Head: Normocephalic and atraumatic.     Nose: Nose normal.     Mouth/Throat:     Mouth: Mucous membranes are moist.     Pharynx: Oropharynx is clear.  Eyes:     Extraocular Movements: Extraocular movements intact.     Conjunctiva/sclera: Conjunctivae normal.     Pupils: Pupils are equal, round, and reactive to light.  Cardiovascular:     Rate and Rhythm: Normal rate and regular rhythm.     Pulses: Normal pulses.     Heart sounds: Normal heart sounds.  Pulmonary:     Effort: Pulmonary effort is normal.     Breath sounds: Normal breath sounds.  Abdominal:     Palpations: Abdomen is soft.  Musculoskeletal:        General: Normal range of motion.     Cervical back: Normal range of motion and neck supple.  Lymphadenopathy:     Cervical: No cervical adenopathy.  Skin:    General: Skin is warm and  dry.     Capillary Refill: Capillary refill takes less than 2 seconds.  Neurological:     General: No focal deficit present.     Mental Status: She is alert and oriented to person, place, and time.  Psychiatric:        Attention and Perception: Attention and perception normal.        Mood and Affect: Mood is depressed. Affect is tearful.        Speech: Speech normal.        Behavior: Behavior normal. Behavior is cooperative.        Thought Content: Thought content normal.        Cognition and Memory: Cognition  and memory normal.        Judgment: Judgment normal.       Assessment & Plan    1. Grief Patient experiencing normal grief after the loss of a good friend yesterday.   2. Body mass index (BMI) of 30.0-30.9 in adult Increase metformin dose to 8520 mg daily. Continue with low calorie, high-protein diet. Incorporate exercise into daily activities.   - metFORMIN (GLUCOPHAGE) 850 MG tablet; Take 1 tablet (850 mg total) by mouth at bedtime.  Dispense: 30 tablet; Refill: 3  3. Major depressive disorder, single episode, severe ([place]) Conitnue regular visits with psychiatry and counseling services for management   4. Adult ADHD (attention deficit hyperactivity disorder) Now seeing attention specialist. Started on Vyvanse. Doing some better.   5. Alcohol dependence with withdrawal with complication ([place]) Continue regular visits with psychiatry and counseling services  6. Lumbar disc disease Patient should see orthopedics as scheduled.     Problem List Items Addressed This Visit       Musculoskeletal and Integument   Lumbar disc disease     Other   Alcohol dependence with withdrawal with complication ([facility])   Major depressive disorder, single episode, severe ([facility])   Body mass index (BMI) of 30.0-30.9 in adult   Relevant Medications   metFORMIN (GLUCOPHAGE) 850 MG tablet   Adult ADHD (attention deficit hyperactivity disorder)   Grief - Primary     Return in about 4 weeks  (around 04/29/2022) for increased metformin - degenerative disc disease .         Ronnell Freshwater, NP  [facility] [phone] (phone) [phone] (fax)  [place]

## 2022-04-02 ENCOUNTER — Telehealth: Payer: Self-pay | Admitting: *Deleted

## 2022-04-07 NOTE — Telephone Encounter (Signed)
Opened in error.Tabias Swayze Zimmerman Rumple, CMA  

## 2022-04-08 ENCOUNTER — Ambulatory Visit: Payer: 59 | Admitting: Orthopedic Surgery

## 2022-04-08 DIAGNOSIS — M5116 Intervertebral disc disorders with radiculopathy, lumbar region: Secondary | ICD-10-CM | POA: Diagnosis not present

## 2022-04-08 MED ORDER — PREGABALIN 75 MG PO CAPS: 75.0000 mg | ORAL_CAPSULE | Freq: Two times a day (BID) | ORAL | 0 refills | Status: DC

## 2022-04-08 NOTE — Progress Notes (Addendum)
Orthopedic Spine Surgery Office Note  Assessment: Patient is a 36 y.o. female with chronic, progressive low back pain that radiates into the left leg and into the dorsum of the foot.  MRI shows DDD at L5-S1 with a disc bulge that is greater on the left side.  There is mild foraminal stenosis at L5-S1 on the left side.   Plan: -Explained that initially conservative treatment is tried as a significant number of patients may experience relief with these treatment modalities. Discussed that the conservative treatments include:  -activity modification  -physical therapy  -over the counter pain medications  -medrol dosepak  -lumbar steroid injections -Patient has tried activity modification, medrol dosepak, NSAIDs, PT, home exercises, gabapentin, zanaflex -Recommended L5/S1 transforaminal injection on the left side for diagnostic and therapeutic purposes -Feels gabapentin is not helping, so offered her Lyrica.  She was interested in trying it.  A prescription was provided to her today. -She is seeing a neurologist and is going to ask them for their thoughts -Patient should return to office in 8 weeks, x-rays at next visit: none   Patient expressed understanding of the plan and all questions were answered to the patient's satisfaction.   ___________________________________________________________________________  History: Patient is a 36 y.o. female who has been previously seen in the office for symptoms consistent with lumbar radiculopathy. Since the last visit, symptom intensity has remained the same.  Still having a lot of low back pain that radiates into the left leg.  Feels it radiate down the lateral thigh, anterior lateral leg into the dorsum of the foot.  It is worse if she is active or standing for a long time.  Generally gets better but not completely go away if she rest.  Pain has been limiting her ability to do her job.  Still no symptoms on the right side.  Is seeing neurology for  possible seizure workup.  Previous treatments: activity modification, medrol dosepak, NSAIDs, PT, home exercises, gabapentin, zanaflex  COPY OF FIRST NOTE Patient is a 36 y.o. female who presents today for lumbar spine. Patient has had over 6 months of low back pain. In the last several weeks, the pain has now started to radiate into her left thigh. She feels it go down her buttock into the lateral aspect of her thigh. It does not go below the knee. There is no right sided symptoms. There was no trauma or injury antecedent to the pain. She states that standing for a prolonged period of time and laying flat aggravate the pain. Sitting makes it mildly better but she still feels it.      Weakness: yes, feels her legs are weaker Symptoms of imbalance: yes, feels she has been clumsy for years Paresthesias and numbness: denies Bowel or bladder incontinence: denies Saddle anesthesia: denies END OF COPY  Physical Exam:  General: no acute distress, appears stated age Neurologic: alert, answering questions appropriately, following commands Respiratory: unlabored breathing on room air, symmetric chest rise Psychiatric: appropriate affect, normal cadence to speech   MSK (spine):  -Strength exam      Left  Right EHL    4+/5  5/5 TA    5/5  5/5 GSC    5/5  5/5 Knee extension  5/5  5/5 Hip flexion   5/5  5/5  -Sensory exam    Sensation intact to light touch in L3-S1 nerve distributions of bilateral lower extremities  -Achilles DTR: 2/4 on the left, 2/4 on the right -Patellar tendon DTR: 2/4 on the  left, 2/4 on the right  -Straight leg raise: negative -Contralateral straight leg raise: negative -Clonus: no beats bilaterally  Imaging: XR of the lumbar spine from 02/05/2022 and 02/03/2022 was previously independently reviewed and interpreted, showing minimal disc height loss at L5/S1 and L4/5. No fracture or dislocation. No evidence of instability on flexion/extension. Grade 1  spondylolisthesis at L5/S1.     MRI of the lumbar spine from 03/27/2022 was independently reviewed and interpreted, showing L5/S1 disc bulge which is asymmetric and larger on the left side and extends extraforaminally.  There is mild foraminal stenosis at L5/S1.  No other significant stenosis seen.  DDD at L5-S1.  T2 signal within the left L5/S1 facet joint.   Patient name: Sylvia Maxwell Patient MRN: [id number] Date of visit: 04/08/22

## 2022-04-13 ENCOUNTER — Ambulatory Visit
Admission: EM | Admit: 2022-04-13 | Discharge: 2022-04-13 | Disposition: A | Discharge status: Home / Self Care | Payer: 59 | Attending: Physician Assistant | Admitting: Physician Assistant

## 2022-04-13 DIAGNOSIS — J209 Acute bronchitis, unspecified: Secondary | ICD-10-CM | POA: Diagnosis not present

## 2022-04-13 MED ORDER — PREDNISONE 20 MG PO TABS: 40.0000 mg | ORAL_TABLET | Freq: Every day | ORAL | 0 refills | Status: AC

## 2022-04-13 NOTE — ED Triage Notes (Signed)
Pt presents with congestion, hoarseness, productive cough, and generalized body aches for over a week.

## 2022-04-13 NOTE — ED Provider Notes (Signed)
[facility]    CSN: [id number] Arrival date & time: 04/13/22  1455      History   Chief Complaint Chief Complaint  Patient presents with   URI    HPI Sylvia Maxwell is a 37 y.o. female.   Patient here today for evaluation of cough, congestion, body aches that started about a week ago. She reports some improvement today compared to yesterday. She has not had fever. She reports that her significant other has recently been sick with same but was seen in ED and tested negative for covid, flu, etc.   The history is provided by the patient.  URI Presenting symptoms: congestion, cough and sore throat   Presenting symptoms: no ear pain and no fever   Associated symptoms: no wheezing     Past Medical History:  Diagnosis Date   Alcoholism ([place])    Bronchitis    Depression    Hepatitis    ? carrier  .01- HEP C - was told when she tried to give blood   Pneumonia    walking pneumonia   aspiriation Pneumonia 05/2017   Seizures ([place])    alcohol withdrawl    Patient Active Problem List   Diagnosis Date Noted   Easy bruising 01/01/2022   High serum ferritin 01/01/2022   Abnormal liver function test 01/01/2022   Chronic active hepatitis ([place]) 04/20/2021   Adult ADHD (attention deficit hyperactivity disorder) 03/17/2021   Neoplasm of uncertain behavior of skin of face 03/09/2021   Body mass index (BMI) of 30.0-30.9 in adult 03/09/2021   Alcohol-induced mood disorder ([place]) 08/24/2018   Tobacco dependence 04/25/2018   Retained products of conception after miscarriage 04/11/2018   Major depressive disorder, single episode, severe ([place]) 05/22/2017   Alcohol dependence with withdrawal with complication ([place]) 11/55/2080   Alcohol withdrawal seizure ([place]) 06/13/2016   Seizure ([place])    ETOH abuse 06/12/2016   Alcohol withdrawal seizure without complication ([place]) 22/33/6122    Past Surgical History:  Procedure Laterality Date   BREAST ENHANCEMENT SURGERY  2008   DILATION  AND EVACUATION N/A 04/15/2018   Procedure: DILATATION AND EVACUATION;  Surgeon: Osborne Oman, MD;  Location: [facility];  Service: Gynecology;  Laterality: N/A;   OPEN REDUCTION, INTERNAL FIXATION (ORIF) CALCANEAL FRACTURE WITH FUSION Left 09/27/2018   Procedure: OPEN REDUCTION, INTERNAL FIXATION (ORIF) CALCANEAL FRACTURE AND EXPLORE PERONEAL TENDONS;  Surgeon: Erle Crocker, MD;  Location: [facility];  Service: Orthopedics;  Laterality: Left;    OB History     Gravida  1   Para      Term      Preterm      AB      Living         SAB      IAB      Ectopic      Multiple      Live Births               Home Medications    Prior to Admission medications   Medication Sig Start Date End Date Taking? Authorizing Provider  predniSONE (DELTASONE) 20 MG tablet Take 2 tablets (40 mg total) by mouth daily with breakfast for 5 days. 04/13/22 04/18/22 Yes Francene Finders, PA-C  AZSTARYS 39.2-7.8 MG CAPS Take 1 capsule by mouth daily. 03/23/22   [provider]  DULoxetine (CYMBALTA) 20 MG capsule Take 1 capsule (20 mg total) by mouth daily. 02/17/22   Genia Harold, MD  fluticasone Asencion Islam)  50 MCG/ACT nasal spray Place 1 spray into both nostrils daily.    [provider]  lisdexamfetamine (VYVANSE) 40 MG capsule Take 40 mg by mouth daily. 01/01/22   [provider]  metFORMIN (GLUCOPHAGE) 850 MG tablet Take 1 tablet (850 mg total) by mouth at bedtime. 04/01/22   Ronnell Freshwater, NP  Multiple Vitamin (MULTIVITAMIN) tablet Take 1 tablet by mouth daily.    [provider]  naproxen (NAPROSYN) 375 MG tablet Take 1 tablet (375 mg total) by mouth 2 (two) times daily. 02/08/22   Sherwood Gambler, MD  pregabalin (LYRICA) 75 MG capsule Take 1 capsule (75 mg total) by mouth 2 (two) times daily. 04/08/22 05/08/22  Callie Fielding, MD  SUMAtriptan (IMITREX) 50 MG tablet Take 1 tablet (50 mg total) by mouth every 2 (two) hours as needed for migraine. May  repeat in 2 hours if headache persists or recurs. 02/17/22   Genia Harold, MD  vitamin B-12 (CYANOCOBALAMIN) 500 MCG tablet Take 500 mcg by mouth daily.    [provider]    Family History Family History  Problem Relation Age of Onset   Alcoholism Other    Diabetes Other     Social History Social History   Tobacco Use   Smoking status: Former    Years: 10.00    Types: Cigarettes    Quit date: 05/2017    Years since quitting: 4.9   Smokeless tobacco: Never  Vaping Use   Vaping Use: Never used  Substance Use Topics   Alcohol use: Yes   Drug use: Not Currently    Comment:  Meth, cocaine - 05/2017     Allergies   Lactose intolerance (gi)   Review of Systems Review of Systems  Constitutional:  Negative for chills and fever.  HENT:  Positive for congestion and sore throat. Negative for ear pain.   Eyes:  Negative for discharge and redness.  Respiratory:  Positive for cough. Negative for shortness of breath and wheezing.   Gastrointestinal:  Positive for diarrhea, nausea and vomiting.     Physical Exam Triage Vital Signs ED Triage Vitals  Enc Vitals Group     BP 04/13/22 1529 121/81     Pulse Rate 04/13/22 1529 92     Resp 04/13/22 1529 18     Temp 04/13/22 1529 98.3 F (36.8 C)     Temp Source 04/13/22 1529 Oral     SpO2 04/13/22 1529 96 %     Weight --      Height --      Head Circumference --      Peak Flow --      Pain Score 04/13/22 1528 2     Pain Loc --      Pain Edu? --      Excl. in [place]? --    No data found.  Updated Vital Signs BP 121/81 (BP Location: Right Arm)   Pulse 92   Temp 98.3 F (36.8 C) (Oral)   Resp 18   SpO2 96%      Physical Exam Vitals and nursing note reviewed.  Constitutional:      General: She is not in acute distress.    Appearance: Normal appearance. She is not ill-appearing.  HENT:     Head: Normocephalic and atraumatic.     Nose: Congestion present.     Mouth/Throat:     Mouth: Mucous membranes are  moist.     Pharynx: No oropharyngeal exudate or posterior  oropharyngeal erythema.     Comments: PND noted Eyes:     Conjunctiva/sclera: Conjunctivae normal.  Cardiovascular:     Rate and Rhythm: Normal rate and regular rhythm.     Heart sounds: Normal heart sounds. No murmur heard. Pulmonary:     Effort: Pulmonary effort is normal. No respiratory distress.     Breath sounds: Normal breath sounds. No wheezing, rhonchi or rales.  Skin:    General: Skin is warm and dry.  Neurological:     Mental Status: She is alert.  Psychiatric:        Mood and Affect: Mood normal.        Thought Content: Thought content normal.      UC Treatments / Results  Labs (all labs ordered are listed, but only abnormal results are displayed) Labs Reviewed - No data to display  EKG   Radiology No results found.  Procedures Procedures (including critical care time)  Medications Ordered in UC Medications - No data to display  Initial Impression / Assessment and Plan / UC Course  I have reviewed the triage vital signs and the nursing notes.  Pertinent labs & imaging results that were available during my care of the patient were reviewed by me and considered in my medical decision making (see chart for details).    Suspect viral etiology of upper respiratory symptoms, will treat to cover bronchitis. Encouraged follow up if no improvement with steroid burst or sooner with any further concerns.   Final Clinical Impressions(s) / UC Diagnoses   Final diagnoses:  Acute bronchitis, unspecified organism   Discharge Instructions   None    ED Prescriptions     Medication Sig Dispense Auth. Provider   predniSONE (DELTASONE) 20 MG tablet Take 2 tablets (40 mg total) by mouth daily with breakfast for 5 days. 10 tablet Francene Finders, PA-C      PDMP not reviewed this encounter.   Francene Finders, PA-C 04/13/22 [phone]

## 2022-04-19 DIAGNOSIS — M519 Unspecified thoracic, thoracolumbar and lumbosacral intervertebral disc disorder: Secondary | ICD-10-CM | POA: Insufficient documentation

## 2022-04-19 DIAGNOSIS — F4321 Adjustment disorder with depressed mood: Secondary | ICD-10-CM | POA: Insufficient documentation

## 2022-04-20 ENCOUNTER — Telehealth: Payer: Self-pay | Admitting: Physical Medicine and Rehabilitation

## 2022-04-20 NOTE — Telephone Encounter (Signed)
Pt returned call to set an appt. Please call pt at [phone].

## 2022-04-20 NOTE — Telephone Encounter (Signed)
Spoke with patient and scheduled injection for 04/22/22

## 2022-04-22 ENCOUNTER — Ambulatory Visit (INDEPENDENT_AMBULATORY_CARE_PROVIDER_SITE_OTHER): Payer: 59 | Admitting: Physical Medicine and Rehabilitation

## 2022-04-22 ENCOUNTER — Telehealth: Payer: Self-pay | Admitting: Gastroenterology

## 2022-04-22 ENCOUNTER — Ambulatory Visit: Payer: Self-pay

## 2022-04-22 VITALS — BP 105/73 | HR 104

## 2022-04-22 DIAGNOSIS — M5416 Radiculopathy, lumbar region: Secondary | ICD-10-CM | POA: Diagnosis not present

## 2022-04-22 MED ORDER — METHYLPREDNISOLONE ACETATE 80 MG/ML IJ SUSP
80.0000 mg | Freq: Once | INTRAMUSCULAR | Status: AC
  Administered 2022-04-22: 80 mg

## 2022-04-22 NOTE — Telephone Encounter (Signed)
Pt medical records were sent from 05/03/2023to 11/009/2023 to [facility] mailed to  [address] [place] [address]

## 2022-04-22 NOTE — Patient Instructions (Signed)

## 2022-04-22 NOTE — Progress Notes (Signed)
Functional Pain Scale - descriptive words and definitions  Intense (8)    Cannot complete any ADLs without much assistance/cannot concentrate/conversation is difficult/unable to sleep and unable to use distraction. Severe range order  Average Pain 8   +Driver, -BT, -Dye Allergies.  Lower back pain on left side that radiates down the leg and up the left side of the back. Hurts to sit or turn

## 2022-05-03 NOTE — Progress Notes (Signed)
Sylvia Maxwell - 37 y.o. female MRN [id number]  Date of birth: 11/28/1985  Office Visit Note: Visit Date: 04/22/2022 PCP: Ronnell Freshwater, NP Referred by: Callie Fielding, MD  Subjective: Chief Complaint  Patient presents with   Lower Back - Pain   HPI:  Sylvia Maxwell is a 37 y.o. female who comes in today at the request of Dr. Ileene Rubens for planned Left L5-S1 Lumbar Transforaminal epidural steroid injection with fluoroscopic guidance.  The patient has failed conservative care including home exercise, medications, time and activity modification.  This injection will be diagnostic and hopefully therapeutic.  Please see requesting physician notes for further details and justification.   ROS Otherwise per HPI.  Assessment & Plan: Visit Diagnoses:    ICD-10-CM   1. Lumbar radiculopathy  M54.16 XR C-ARM NO REPORT    Epidural Steroid injection    methylPREDNISolone acetate (DEPO-MEDROL) injection 80 mg      Plan: No additional findings.   Meds & Orders:  Meds ordered this encounter  Medications   methylPREDNISolone acetate (DEPO-MEDROL) injection 80 mg    Orders Placed This Encounter  Procedures   XR C-ARM NO REPORT   Epidural Steroid injection    Follow-up: Return for visit to requesting provider as needed.   Procedures: No procedures performed  Lumbosacral Transforaminal Epidural Steroid Injection - Sub-Pedicular Approach with Fluoroscopic Guidance  Patient: Sylvia Maxwell      Date of Birth: 1985-06-17 MRN: [id number] PCP: Ronnell Freshwater, NP      Visit Date: 04/22/2022   Universal Protocol:    Date/Time: 04/22/2022  Consent Given By: the patient  Position: PRONE  Additional Comments: Vital signs were monitored before and after the procedure. Patient was prepped and draped in the usual sterile fashion. The correct patient, procedure, and site was verified.   Injection Procedure Details:   Procedure diagnoses: Lumbar radiculopathy [M54.16]    Meds  Administered:  Meds ordered this encounter  Medications   methylPREDNISolone acetate (DEPO-MEDROL) injection 80 mg    Laterality: Left  Location/Site: L5  Needle:5.0 in., 22 ga.  Short bevel or Quincke spinal needle  Needle Placement: Transforaminal  Findings:    -Comments: Excellent flow of contrast along the nerve, nerve root and into the epidural space.  Procedure Details: After squaring off the end-plates to get a true AP view, the C-arm was positioned so that an oblique view of the foramen as noted above was visualized. The target area is just inferior to the "nose of the scotty dog" or sub pedicular. The soft tissues overlying this structure were infiltrated with 2-3 ml. of 1% Lidocaine without Epinephrine.  The spinal needle was inserted toward the target using a "trajectory" view along the fluoroscope beam.  Under AP and lateral visualization, the needle was advanced so it did not puncture dura and was located close the 6 O'Clock position of the pedical in AP tracterory. Biplanar projections were used to confirm position. Aspiration was confirmed to be negative for CSF and/or blood. A 1-2 ml. volume of Isovue-250 was injected and flow of contrast was noted at each level. Radiographs were obtained for documentation purposes.   After attaining the desired flow of contrast documented above, a 0.5 to 1.0 ml test dose of 0.25% Marcaine was injected into each respective transforaminal space.  The patient was observed for 90 seconds post injection.  After no sensory deficits were reported, and normal lower extremity motor function was noted,   the above injectate was administered so  that equal amounts of the injectate were placed at each foramen (level) into the transforaminal epidural space.   Additional Comments:  No complications occurred Dressing: 2 x 2 sterile gauze and Band-Aid    Post-procedure details: Patient was observed during the procedure. Post-procedure instructions  were reviewed.  Patient left the clinic in stable condition.    Clinical History: MRI LUMBAR SPINE WITHOUT CONTRAST   TECHNIQUE: Multiplanar, multisequence MR imaging of the lumbar spine was performed. No intravenous contrast was administered.   COMPARISON:  None Available.   FINDINGS: Segmentation:  Standard.   Alignment:  Physiologic.   Vertebrae:  No fracture, evidence of discitis, or bone lesion.   Conus medullaris and cauda equina: Conus extends to the L1-L2 level. Conus and cauda equina appear normal.   Paraspinal and other soft tissues: Negative.   Disc levels:   T12-L1: No significant disc bulge. No spinal canal stenosis or neural foraminal narrowing.   L1-L2: No significant disc bulge. No spinal canal stenosis or neural foraminal narrowing.   L2-L3: No significant disc bulge. No spinal canal stenosis or neural foraminal narrowing.   L3-L4: No significant disc bulge. Small right foraminal protrusion. No spinal canal stenosis or neural foraminal narrowing.   L4-L5: No significant disc bulge. No spinal canal stenosis or neural foraminal narrowing.   L5-S1: Disc desiccation and minimal disc bulge, the left aspect of which likely contacts the exiting left L5 nerve (series 14, image 38). Severe left and mild right facet arthropathy, with widening of the left facets, which can indicate instability. No spinal canal stenosis or neural foraminal narrowing.   IMPRESSION: 1. L5-S1 severe left and mild right facet arthropathy, with widening of the left facets, which can indicate instability. 2. L5-S1 disc bulge, the left aspect of which likely contacts the exiting left L5 nerve. 3. No spinal canal stenosis or neural foraminal narrowing.     Electronically Signed   By: Merilyn Baba M.D.   On: 03/30/2022 01:43     Objective:  VS:  HT:    WT:   BMI:     BP:105/73  HR:(!) 104bpm  TEMP: ( )  RESP:  Physical Exam Vitals and nursing note reviewed.   Constitutional:      General: She is not in acute distress.    Appearance: Normal appearance. She is not ill-appearing.  HENT:     Head: Normocephalic and atraumatic.     Right Ear: External ear normal.     Left Ear: External ear normal.  Eyes:     Extraocular Movements: Extraocular movements intact.  Cardiovascular:     Rate and Rhythm: Normal rate.     Pulses: Normal pulses.  Pulmonary:     Effort: Pulmonary effort is normal. No respiratory distress.  Abdominal:     General: There is no distension.     Palpations: Abdomen is soft.  Musculoskeletal:        General: Tenderness present.     Cervical back: Neck supple.     Right lower leg: No edema.     Left lower leg: No edema.     Comments: Patient has good distal strength with no pain over the greater trochanters.  No clonus or focal weakness.  Skin:    Findings: No erythema, lesion or rash.  Neurological:     General: No focal deficit present.     Mental Status: She is alert and oriented to person, place, and time.     Sensory: No sensory deficit.  Motor: No weakness or abnormal muscle tone.     Coordination: Coordination normal.  Psychiatric:        Mood and Affect: Mood normal.        Behavior: Behavior normal.      Imaging: No results found.

## 2022-05-03 NOTE — Procedures (Signed)
Lumbosacral Transforaminal Epidural Steroid Injection - Sub-Pedicular Approach with Fluoroscopic Guidance  Patient: Sylvia Maxwell      Date of Birth: 26-Jul-1985 MRN: [id number] PCP: Ronnell Freshwater, NP      Visit Date: 04/22/2022   Universal Protocol:    Date/Time: 04/22/2022  Consent Given By: the patient  Position: PRONE  Additional Comments: Vital signs were monitored before and after the procedure. Patient was prepped and draped in the usual sterile fashion. The correct patient, procedure, and site was verified.   Injection Procedure Details:   Procedure diagnoses: Lumbar radiculopathy [M54.16]    Meds Administered:  Meds ordered this encounter  Medications   methylPREDNISolone acetate (DEPO-MEDROL) injection 80 mg    Laterality: Left  Location/Site: L5  Needle:5.0 in., 22 ga.  Short bevel or Quincke spinal needle  Needle Placement: Transforaminal  Findings:    -Comments: Excellent flow of contrast along the nerve, nerve root and into the epidural space.  Procedure Details: After squaring off the end-plates to get a true AP view, the C-arm was positioned so that an oblique view of the foramen as noted above was visualized. The target area is just inferior to the "nose of the scotty dog" or sub pedicular. The soft tissues overlying this structure were infiltrated with 2-3 ml. of 1% Lidocaine without Epinephrine.  The spinal needle was inserted toward the target using a "trajectory" view along the fluoroscope beam.  Under AP and lateral visualization, the needle was advanced so it did not puncture dura and was located close the 6 O'Clock position of the pedical in AP tracterory. Biplanar projections were used to confirm position. Aspiration was confirmed to be negative for CSF and/or blood. A 1-2 ml. volume of Isovue-250 was injected and flow of contrast was noted at each level. Radiographs were obtained for documentation purposes.   After attaining the desired flow of  contrast documented above, a 0.5 to 1.0 ml test dose of 0.25% Marcaine was injected into each respective transforaminal space.  The patient was observed for 90 seconds post injection.  After no sensory deficits were reported, and normal lower extremity motor function was noted,   the above injectate was administered so that equal amounts of the injectate were placed at each foramen (level) into the transforaminal epidural space.   Additional Comments:  No complications occurred Dressing: 2 x 2 sterile gauze and Band-Aid    Post-procedure details: Patient was observed during the procedure. Post-procedure instructions were reviewed.  Patient left the clinic in stable condition.

## 2022-05-11 ENCOUNTER — Ambulatory Visit: Payer: 59 | Admitting: Nurse Practitioner

## 2022-05-12 ENCOUNTER — Telehealth: Payer: Self-pay | Admitting: Physical Medicine and Rehabilitation

## 2022-05-12 NOTE — Telephone Encounter (Signed)
Patient called the PT line and LM to cxl her appointment 1/31 with [facility].

## 2022-05-12 NOTE — Telephone Encounter (Signed)
Appointment cancelled

## 2022-05-13 ENCOUNTER — Ambulatory Visit: Payer: 59 | Admitting: Physical Medicine and Rehabilitation

## 2022-05-14 NOTE — Progress Notes (Deleted)
   CC:  headaches  Follow-up Visit  Last visit: 02/17/22  Brief HPI: 37 year old female with a history of  polysubstance abuse, seizure 2/2 alcohol withdrawal, depression, kidney stones, hypotension and syncope who follows in clinic for migraines. MRI brain/C-spine and CTA head/neck on 01/20/22 were unremarkable.   At her last visit, EEG was ordered for an episode of unresponsiveness. She was started on Cymbalta for headache prevention and Imitrex for rescue.  Interval History: Headaches***Cymbalta***Imitrex***  EEG 02/25/22 was normal.  Headache days per month: *** Migraine days per month*** Headache free days per month: ***  Current Headache Regimen: Preventative: *** Abortive: ***   Prior Therapies                                  Rescue: Ibuprofen Aleve Excedrin - jittery  Prevention: Prozac Cymbalta 20 mg daily Topamax contraindicated due to kidney stones Propranolol contraindicated due to hypotension and syncope  Physical Exam:   Vital Signs: There were no vitals taken for this visit. GENERAL:  well appearing, in no acute distress, alert  SKIN:  Color, texture, turgor normal. No rashes or lesions HEAD:  Normocephalic/atraumatic. RESP: normal respiratory effort MSK:  No gross joint deformities.   NEUROLOGICAL: Mental Status: Alert, oriented to person, place and time, Follows commands, and Speech fluent and appropriate. Cranial Nerves: PERRL, face symmetric, no dysarthria, hearing grossly intact Motor: moves all extremities equally Gait: normal-based.  IMPRESSION: ***  PLAN: ***   Follow-up: ***  I spent a total of *** minutes on the date of the service. Headache education was done. Discussed lifestyle modification including increased oral hydration, decreased caffeine, exercise and stress management. Discussed treatment options including preventive and acute medications, natural supplements, and infusion therapy. Discussed medication overuse headache  and to limit use of acute treatments to no more than 2 days/week or 10 days/month. Discussed medication side effects, adverse reactions and drug interactions. Written educational materials and patient instructions outlining all of the above were given.  Genia Harold, MD

## 2022-05-18 ENCOUNTER — Ambulatory Visit: Payer: 59 | Admitting: Psychiatry

## 2022-05-18 ENCOUNTER — Encounter: Payer: Self-pay | Admitting: Psychiatry

## 2022-05-27 ENCOUNTER — Ambulatory Visit: Payer: 59 | Admitting: Nurse Practitioner

## 2022-06-08 ENCOUNTER — Encounter: Payer: Self-pay | Admitting: Psychiatry

## 2022-06-09 ENCOUNTER — Other Ambulatory Visit: Payer: Self-pay

## 2022-06-09 MED ORDER — DULOXETINE HCL 20 MG PO CPEP: 20.0000 mg | ORAL_CAPSULE | Freq: Every day | ORAL | 0 refills | Status: DC

## 2022-06-15 NOTE — Progress Notes (Unsigned)
Established patient visit   Patient: Sylvia Maxwell   DOB: 1985/06/03   37 y.o. Female  MRN: [id number] Visit Date: 06/16/2022   No chief complaint on file.  Subjective    HPI  Follow up  -seeing attention specialist for management of ADHD -sees psychiatry for GAD and depression  -seeing orthopedics provider for lumbar spine DDD  -weight management --increased metformin to 850 mg at bedtime.  --gradually losing weight  --no negative side effects    Medications: Outpatient Medications Prior to Visit  Medication Sig   AZSTARYS 39.2-7.8 MG CAPS Take 1 capsule by mouth daily.   DULoxetine (CYMBALTA) 20 MG capsule Take 1 capsule (20 mg total) by mouth daily.   DULoxetine (CYMBALTA) 20 MG capsule Take 1 capsule (20 mg total) by mouth daily.   fluticasone (FLONASE) 50 MCG/ACT nasal spray Place 1 spray into both nostrils daily.   lisdexamfetamine (VYVANSE) 40 MG capsule Take 40 mg by mouth daily.   metFORMIN (GLUCOPHAGE) 850 MG tablet Take 1 tablet (850 mg total) by mouth at bedtime.   Multiple Vitamin (MULTIVITAMIN) tablet Take 1 tablet by mouth daily.   naproxen (NAPROSYN) 375 MG tablet Take 1 tablet (375 mg total) by mouth 2 (two) times daily.   pregabalin (LYRICA) 75 MG capsule Take 1 capsule (75 mg total) by mouth 2 (two) times daily.   SUMAtriptan (IMITREX) 50 MG tablet Take 1 tablet (50 mg total) by mouth every 2 (two) hours as needed for migraine. May repeat in 2 hours if headache persists or recurs.   vitamin B-12 (CYANOCOBALAMIN) 500 MCG tablet Take 500 mcg by mouth daily.   No facility-administered medications prior to visit.    Review of Systems  {Labs (Optional):23779}   Objective    There were no vitals filed for this visit. There is no height or weight on file to calculate BMI.  BP Readings from Last 3 Encounters:  04/22/22 105/73  04/13/22 121/81  04/01/22 (Abnormal) 137/95    Wt Readings from Last 3 Encounters:  04/01/22 167 lb (75.8 kg)  02/17/22 168 lb  (76.2 kg)  02/08/22 169 lb 1.5 oz (76.7 kg)    Physical Exam  ***  No results found for any visits on 06/16/22.  Assessment & Plan     Problem List Items Addressed This Visit   None    No follow-ups on file.         Ronnell Freshwater, NP  [facility] at [facility] [phone] (phone) [phone] (fax)  [place]

## 2022-06-16 ENCOUNTER — Ambulatory Visit (INDEPENDENT_AMBULATORY_CARE_PROVIDER_SITE_OTHER): Payer: Medicaid Other | Admitting: Nurse Practitioner

## 2022-06-16 ENCOUNTER — Encounter: Payer: Self-pay | Admitting: Nurse Practitioner

## 2022-06-16 VITALS — BP 103/69 | HR 76 | Ht 66.0 in | Wt 159.1 lb

## 2022-06-16 DIAGNOSIS — D485 Neoplasm of uncertain behavior of skin: Secondary | ICD-10-CM

## 2022-06-16 DIAGNOSIS — Z6830 Body mass index (BMI) 30.0-30.9, adult: Secondary | ICD-10-CM | POA: Diagnosis not present

## 2022-06-16 DIAGNOSIS — F322 Major depressive disorder, single episode, severe without psychotic features: Secondary | ICD-10-CM

## 2022-06-16 DIAGNOSIS — M519 Unspecified thoracic, thoracolumbar and lumbosacral intervertebral disc disorder: Secondary | ICD-10-CM | POA: Diagnosis not present

## 2022-06-16 DIAGNOSIS — F439 Reaction to severe stress, unspecified: Secondary | ICD-10-CM

## 2022-06-16 MED ORDER — METFORMIN HCL 850 MG PO TABS: 850.0000 mg | ORAL_TABLET | Freq: Every day | ORAL | 1 refills | Status: DC

## 2022-07-13 ENCOUNTER — Encounter: Payer: Self-pay | Admitting: Psychiatry

## 2022-07-13 MED ORDER — DULOXETINE HCL 20 MG PO CPEP: 20.0000 mg | ORAL_CAPSULE | Freq: Every day | ORAL | 0 refills | Status: DC

## 2022-07-23 ENCOUNTER — Ambulatory Visit: Payer: Medicaid Other

## 2022-07-27 ENCOUNTER — Encounter: Payer: Self-pay | Admitting: Orthopedic Surgery

## 2022-07-27 ENCOUNTER — Encounter: Payer: Self-pay | Admitting: Physical Medicine and Rehabilitation

## 2022-07-28 ENCOUNTER — Other Ambulatory Visit: Payer: Self-pay | Admitting: Physical Medicine and Rehabilitation

## 2022-07-28 DIAGNOSIS — M5416 Radiculopathy, lumbar region: Secondary | ICD-10-CM

## 2022-07-30 ENCOUNTER — Ambulatory Visit: Payer: Medicaid Other

## 2022-08-10 ENCOUNTER — Other Ambulatory Visit: Payer: Self-pay

## 2022-08-10 ENCOUNTER — Ambulatory Visit (INDEPENDENT_AMBULATORY_CARE_PROVIDER_SITE_OTHER): Payer: Medicaid Other | Admitting: Physical Medicine and Rehabilitation

## 2022-08-10 VITALS — BP 125/86 | HR 103

## 2022-08-10 DIAGNOSIS — M5416 Radiculopathy, lumbar region: Secondary | ICD-10-CM

## 2022-08-10 MED ORDER — METHYLPREDNISOLONE ACETATE 80 MG/ML IJ SUSP
80.0000 mg | Freq: Once | INTRAMUSCULAR | Status: AC
Start: 2022-08-10 — End: 2022-08-10
  Administered 2022-08-10: 80 mg

## 2022-08-10 NOTE — Patient Instructions (Signed)

## 2022-08-10 NOTE — Progress Notes (Unsigned)
Functional Pain Scale - descriptive words and definitions  Distracting (5)    Aware of pain/able to complete some ADL's but limited by pain/sleep is affected and active distractions are only slightly useful. Moderate range order  Average Pain  varies   +Driver, -BT, -Dye Allergies.  Lower back pain on left side that radiates into the left leg and now up the back to the left shoulder blade

## 2022-08-11 NOTE — Procedures (Signed)
Lumbosacral Transforaminal Epidural Steroid Injection - Sub-Pedicular Approach with Fluoroscopic Guidance  Patient: Sylvia Maxwell      Date of Birth: 1985-09-26 MRN: [id number] PCP: Carlean Jews, NP      Visit Date: 08/10/2022   Universal Protocol:    Date/Time: 08/10/2022  Consent Given By: the patient  Position: PRONE  Additional Comments: Vital signs were monitored before and after the procedure. Patient was prepped and draped in the usual sterile fashion. The correct patient, procedure, and site was verified.   Injection Procedure Details:   Procedure diagnoses: Lumbar radiculopathy [M54.16]    Meds Administered:  Meds ordered this encounter  Medications   methylPREDNISolone acetate (DEPO-MEDROL) injection 80 mg    Laterality: Left  Location/Site: L5  Needle:5.0 in., 22 ga.  Short bevel or Quincke spinal needle  Needle Placement: Transforaminal  Findings:    -Comments: Excellent flow of contrast along the nerve, nerve root and into the epidural space.  Procedure Details: After squaring off the end-plates to get a true AP view, the C-arm was positioned so that an oblique view of the foramen as noted above was visualized. The target area is just inferior to the "nose of the scotty dog" or sub pedicular. The soft tissues overlying this structure were infiltrated with 2-3 ml. of 1% Lidocaine without Epinephrine.  The spinal needle was inserted toward the target using a "trajectory" view along the fluoroscope beam.  Under AP and lateral visualization, the needle was advanced so it did not puncture dura and was located close the 6 O'Clock position of the pedical in AP tracterory. Biplanar projections were used to confirm position. Aspiration was confirmed to be negative for CSF and/or blood. A 1-2 ml. volume of Isovue-250 was injected and flow of contrast was noted at each level. Radiographs were obtained for documentation purposes.   After attaining the desired flow of  contrast documented above, a 0.5 to 1.0 ml test dose of 0.25% Marcaine was injected into each respective transforaminal space.  The patient was observed for 90 seconds post injection.  After no sensory deficits were reported, and normal lower extremity motor function was noted,   the above injectate was administered so that equal amounts of the injectate were placed at each foramen (level) into the transforaminal epidural space.   Additional Comments:  No complications occurred Dressing: 2 x 2 sterile gauze and Band-Aid    Post-procedure details: Patient was observed during the procedure. Post-procedure instructions were reviewed.  Patient left the clinic in stable condition.

## 2022-08-11 NOTE — Progress Notes (Signed)
Sylvia Maxwell - 37 y.o. female MRN [id number]  Date of birth: 1985-11-08  Office Visit Note: Visit Date: 08/10/2022 PCP: Carlean Jews, NP Referred by: London Sheer, MD  Subjective: Chief Complaint  Patient presents with   Lower Back - Pain   HPI:  Sylvia Maxwell is a 37 y.o. female who comes in today for planned repeat Left L5-S1  Lumbar Transforaminal epidural steroid injection with fluoroscopic guidance.  The patient has failed conservative care including home exercise, medications, time and activity modification.  This injection will be diagnostic and hopefully therapeutic.  Please see requesting physician notes for further details and justification. Patient received more than 50% pain relief from prior injection.  She reported excellent relief diagnostically but unfortunately short-lived.  Depending on results would contemplate left L5-S1 facet joint block.  While she does get leg pain she has mostly back pain.  Today she is also endorsing pain in the upper back as well and was wondering if this is related.  We discussed this with her that clearly there is no direct relationship with that lower lumbar region and her upper back.  Referring: Dr. Willia Craze   ROS Otherwise per HPI.  Assessment & Plan: Visit Diagnoses:    ICD-10-CM   1. Lumbar radiculopathy  M54.16 XR C-ARM NO REPORT    Epidural Steroid injection    methylPREDNISolone acetate (DEPO-MEDROL) injection 80 mg      Plan: No additional findings.   Meds & Orders:  Meds ordered this encounter  Medications   methylPREDNISolone acetate (DEPO-MEDROL) injection 80 mg    Orders Placed This Encounter  Procedures   XR C-ARM NO REPORT   Epidural Steroid injection    Follow-up: Return for visit to requesting provider as needed.   Procedures: No procedures performed  Lumbosacral Transforaminal Epidural Steroid Injection - Sub-Pedicular Approach with Fluoroscopic Guidance  Patient: Sylvia Maxwell      Date of  Birth: Nov 09, 1985 MRN: [id number] PCP: Carlean Jews, NP      Visit Date: 08/10/2022   Universal Protocol:    Date/Time: 08/10/2022  Consent Given By: the patient  Position: PRONE  Additional Comments: Vital signs were monitored before and after the procedure. Patient was prepped and draped in the usual sterile fashion. The correct patient, procedure, and site was verified.   Injection Procedure Details:   Procedure diagnoses: Lumbar radiculopathy [M54.16]    Meds Administered:  Meds ordered this encounter  Medications   methylPREDNISolone acetate (DEPO-MEDROL) injection 80 mg    Laterality: Left  Location/Site: L5  Needle:5.0 in., 22 ga.  Short bevel or Quincke spinal needle  Needle Placement: Transforaminal  Findings:    -Comments: Excellent flow of contrast along the nerve, nerve root and into the epidural space.  Procedure Details: After squaring off the end-plates to get a true AP view, the C-arm was positioned so that an oblique view of the foramen as noted above was visualized. The target area is just inferior to the "nose of the scotty dog" or sub pedicular. The soft tissues overlying this structure were infiltrated with 2-3 ml. of 1% Lidocaine without Epinephrine.  The spinal needle was inserted toward the target using a "trajectory" view along the fluoroscope beam.  Under AP and lateral visualization, the needle was advanced so it did not puncture dura and was located close the 6 O'Clock position of the pedical in AP tracterory. Biplanar projections were used to confirm position. Aspiration was confirmed to be negative for CSF and/or blood.  A 1-2 ml. volume of Isovue-250 was injected and flow of contrast was noted at each level. Radiographs were obtained for documentation purposes.   After attaining the desired flow of contrast documented above, a 0.5 to 1.0 ml test dose of 0.25% Marcaine was injected into each respective transforaminal space.  The patient was  observed for 90 seconds post injection.  After no sensory deficits were reported, and normal lower extremity motor function was noted,   the above injectate was administered so that equal amounts of the injectate were placed at each foramen (level) into the transforaminal epidural space.   Additional Comments:  No complications occurred Dressing: 2 x 2 sterile gauze and Band-Aid    Post-procedure details: Patient was observed during the procedure. Post-procedure instructions were reviewed.  Patient left the clinic in stable condition.    Clinical History: MRI LUMBAR SPINE WITHOUT CONTRAST   TECHNIQUE: Multiplanar, multisequence MR imaging of the lumbar spine was performed. No intravenous contrast was administered.   COMPARISON:  None Available.   FINDINGS: Segmentation:  Standard.   Alignment:  Physiologic.   Vertebrae:  No fracture, evidence of discitis, or bone lesion.   Conus medullaris and cauda equina: Conus extends to the L1-L2 level. Conus and cauda equina appear normal.   Paraspinal and other soft tissues: Negative.   Disc levels:   T12-L1: No significant disc bulge. No spinal canal stenosis or neural foraminal narrowing.   L1-L2: No significant disc bulge. No spinal canal stenosis or neural foraminal narrowing.   L2-L3: No significant disc bulge. No spinal canal stenosis or neural foraminal narrowing.   L3-L4: No significant disc bulge. Small right foraminal protrusion. No spinal canal stenosis or neural foraminal narrowing.   L4-L5: No significant disc bulge. No spinal canal stenosis or neural foraminal narrowing.   L5-S1: Disc desiccation and minimal disc bulge, the left aspect of which likely contacts the exiting left L5 nerve (series 14, image 38). Severe left and mild right facet arthropathy, with widening of the left facets, which can indicate instability. No spinal canal stenosis or neural foraminal narrowing.   IMPRESSION: 1. L5-S1 severe  left and mild right facet arthropathy, with widening of the left facets, which can indicate instability. 2. L5-S1 disc bulge, the left aspect of which likely contacts the exiting left L5 nerve. 3. No spinal canal stenosis or neural foraminal narrowing.     Electronically Signed   By: Wiliam Ke M.D.   On: 03/30/2022 01:43     Objective:  VS:  HT:    WT:   BMI:     BP:125/86  HR:(!) 103bpm  TEMP: ( )  RESP:  Physical Exam Vitals and nursing note reviewed.  Constitutional:      General: She is not in acute distress.    Appearance: Normal appearance. She is not ill-appearing.  HENT:     Head: Normocephalic and atraumatic.     Right Ear: External ear normal.     Left Ear: External ear normal.  Eyes:     Extraocular Movements: Extraocular movements intact.  Cardiovascular:     Rate and Rhythm: Normal rate.     Pulses: Normal pulses.  Pulmonary:     Effort: Pulmonary effort is normal. No respiratory distress.  Abdominal:     General: There is no distension.     Palpations: Abdomen is soft.  Musculoskeletal:        General: Tenderness present.     Cervical back: Neck supple.  Right lower leg: No edema.     Left lower leg: No edema.     Comments: Patient has good distal strength with no pain over the greater trochanters.  No clonus or focal weakness.  Skin:    Findings: No erythema, lesion or rash.  Neurological:     General: No focal deficit present.     Mental Status: She is alert and oriented to person, place, and time.     Sensory: No sensory deficit.     Motor: No weakness or abnormal muscle tone.     Coordination: Coordination normal.  Psychiatric:        Mood and Affect: Mood normal.        Behavior: Behavior normal.      Imaging: XR C-ARM NO REPORT  Result Date: 08/10/2022 Please see Notes tab for imaging impression.

## 2022-08-17 ENCOUNTER — Encounter: Payer: Self-pay | Admitting: Physical Medicine and Rehabilitation

## 2022-08-18 ENCOUNTER — Other Ambulatory Visit: Payer: Self-pay | Admitting: Physical Medicine and Rehabilitation

## 2022-08-18 DIAGNOSIS — G8929 Other chronic pain: Secondary | ICD-10-CM

## 2022-08-18 DIAGNOSIS — M47816 Spondylosis without myelopathy or radiculopathy, lumbar region: Secondary | ICD-10-CM

## 2022-08-21 ENCOUNTER — Telehealth: Payer: Self-pay | Admitting: Physical Medicine and Rehabilitation

## 2022-08-21 NOTE — Telephone Encounter (Signed)
Spoke with patient and scheduled injection for 08/31/22. Patient aware driver needed

## 2022-08-21 NOTE — Telephone Encounter (Signed)
Patient asking for another injection. States Dr. Alvester Morin told her he would give injection in another area. Please advise

## 2022-08-31 ENCOUNTER — Encounter: Payer: Medicaid Other | Admitting: Physical Medicine and Rehabilitation

## 2023-02-23 ENCOUNTER — Encounter: Payer: Self-pay | Admitting: Nurse Practitioner

## 2023-02-23 NOTE — Telephone Encounter (Signed)
Hey April. I am unable to fill these for the patient, but I don't see where she has had a follow up since 06/2022 or that she has upcoming appointment. Will you schedule one for her? Please and thank you.

## 2023-02-26 ENCOUNTER — Telehealth (INDEPENDENT_AMBULATORY_CARE_PROVIDER_SITE_OTHER): Payer: Medicaid Other | Admitting: Family Medicine

## 2023-02-26 ENCOUNTER — Telehealth: Payer: Self-pay

## 2023-02-26 DIAGNOSIS — F322 Major depressive disorder, single episode, severe without psychotic features: Secondary | ICD-10-CM

## 2023-02-26 DIAGNOSIS — Z6830 Body mass index (BMI) 30.0-30.9, adult: Secondary | ICD-10-CM | POA: Diagnosis not present

## 2023-02-26 DIAGNOSIS — R634 Abnormal weight loss: Secondary | ICD-10-CM | POA: Diagnosis not present

## 2023-02-26 MED ORDER — FLUOXETINE HCL 20 MG PO TABS: 30.0000 mg | ORAL_TABLET | Freq: Every day | ORAL | 0 refills | Status: DC

## 2023-02-26 MED ORDER — METFORMIN HCL 850 MG PO TABS: 850.0000 mg | ORAL_TABLET | Freq: Every day | ORAL | 1 refills | Status: DC

## 2023-02-26 NOTE — Progress Notes (Signed)
   Established Patient Office Visit  Subjective   Patient ID: Sylvia Maxwell, female    DOB: 1986/02/04  Age: 37 y.o. MRN: [id number]  No chief complaint on file. Patient location: [facility] Provider location: [facility] This appointment was carried out via video teleconferencing over a period of 20 minutes.  HPI  Depression-patient has been on Prozac and Wellbutrin in the past as well as Abilify.  Most recently patient tried Wellbutrin and did not feel it was as effective as her Prozac.  Patient had been on Prozac up to 40 mg dosing.  For the past 6 weeks she has been taking 20 mg capsules that she has leftover from previous prescriptions.  Feels like this has been helping somewhat with her symptoms.  Patient is more stressed out and depressed lately due to her separation from her husband and other stressors.  We discussed adjusting the dose.  Patient had concerns about feeling like a zombie if she were to increase the dose to 40 mg.  Agreed to try 30 mg.  We discussed therapists.  Patient is trying to find a therapist in [place] where she currently is at the moment.  Has been having difficulty because she is unsure what her insurance status is.  Previous insurance was through her husband and in [place].  Metformin-patient has been taking metformin but not for the past month since she has been out.  She recently started this after gaining weight while being on Abilify.  She also tries to exercise regularly and eat healthy.  Tolerates the medicine well.  No questions or concerns regarding it.   The ASCVD Risk score (Arnett DK, et al., 2019) failed to calculate for the following reasons:   The 2019 ASCVD risk score is only valid for ages 53 to 43  Health Maintenance Due  Topic Date Due   COVID-19 Vaccine (3 - Pfizer risk series) 10/15/2019   INFLUENZA VACCINE  Never done      Objective:     There were no vitals taken for this visit.   Physical Exam General: Alert  and oriented.  Appointment conducted over video Psych: Pleasant.  Becomes slightly emotional at times talking about her current situation.   No results found for any visits on 02/26/23.      Assessment & Plan:   Major depressive disorder, single episode, severe (HCC) Assessment & Plan: Patient has been taking 20 mg Prozac for the past 7 weeks.  Needs new prescription.  Agreeable to taking 30 mg.  Will send in as tablets so she can break it in half.  Recommended patient seek out therapy options locally as well.  Recommended www.[email or web address], directory.  Follow-up in approximately 5 weeks   Body mass index (BMI) of 30.0-30.9 in adult -     metFORMIN HCl; Take 1 tablet (850 mg total) by mouth at bedtime.  Dispense: 90 tablet; Refill: 1  Weight loss Assessment & Plan: Patient continues to take metformin daily to help with weight gain that originally occurred when starting Abilify.  Exercises regularly and tries to eat a healthy diet.  Refill for metformin sent in.   Other orders -     FLUoxetine HCl; Take 1.5 tablets (30 mg total) by mouth daily.  Dispense: 90 tablet; Refill: 0     Return in about 5 weeks (around 04/02/2023) for mood.    Sandre Kitty, MD

## 2023-02-26 NOTE — Assessment & Plan Note (Signed)
Patient has been taking 20 mg Prozac for the past 7 weeks.  Needs new prescription.  Agreeable to taking 30 mg.  Will send in as tablets so she can break it in half.  Recommended patient seek out therapy options locally as well.  Recommended www.[email or web address], directory.  Follow-up in approximately 5 weeks

## 2023-02-26 NOTE — Assessment & Plan Note (Signed)
Patient continues to take metformin daily to help with weight gain that originally occurred when starting Abilify.  Exercises regularly and tries to eat a healthy diet.  Refill for metformin sent in.

## 2023-02-26 NOTE — Telephone Encounter (Signed)
Called pt twice for a telehealth video no response LVM to contact the office

## 2023-04-01 NOTE — Progress Notes (Unsigned)
   Established Patient Office Visit  Subjective   Patient ID: Sylvia Maxwell, female    DOB: Jun 18, 1985  Age: 37 y.o. MRN: [id number]  No chief complaint on file.  This appointment was conducted via [employer], applications. Patient location: [place], [employer] [place] Provider location: [facility] primary care Duration of encounter: 25 minutes.  HPI Patient still in [place].  Still having significant stress regarding her recent separation from her husband.  We discussed her medications.  She has been on the 30 mg Prozac and feels like this has helped some but still feels depressed, sad especially when she is at home alone.  Has found a therapy option through a client at her job.  We discussed medication options (increasing to 40 mg versus starting a second medication) patient has taken bupropion in the past, did not tolerate it well.  Took 10 mg of Abilify in the past and had significant weight gain.  Patient does endorse some general anxiety surrounding work.  We discussed BuSpar.  Patient agreeable to starting BuSpar at 5 mg twice daily and increasing the Prozac to 40 mg.  The ASCVD Risk score (Arnett DK, et al., 2019) failed to calculate for the following reasons:   The 2019 ASCVD risk score is only valid for ages 61 to 53  Health Maintenance Due  Topic Date Due   COVID-19 Vaccine (3 - Pfizer risk series) 10/15/2019   INFLUENZA VACCINE  Never done      Objective:     Physical Exam General: Alert, oriented Psych: Pleasant.  Appears somewhat anxious.   No results found for any visits on 04/02/23.      Assessment & Plan:   Major depressive disorder, single episode, severe (HCC) Assessment & Plan: Patient with some improvement since increasing to 30 mg Prozac.  Not fully treated yet.  Will increase to 40 mg and add BuSpar 5 mg twice a day.  Follow-up in approximately 5 weeks.   Other orders -     busPIRone HCl; Take 1 tablet (5 mg total) by mouth 2 (two) times daily.  Dispense:  60 tablet; Refill: 1 -     FLUoxetine HCl; Take 1 capsule (40 mg total) by mouth daily.  Dispense: 30 capsule; Refill: 1     No follow-ups on file.    Sandre Kitty, MD

## 2023-04-02 ENCOUNTER — Telehealth (INDEPENDENT_AMBULATORY_CARE_PROVIDER_SITE_OTHER): Payer: Medicaid Other | Admitting: Family Medicine

## 2023-04-02 ENCOUNTER — Telehealth: Payer: Self-pay | Admitting: *Deleted

## 2023-04-02 DIAGNOSIS — F322 Major depressive disorder, single episode, severe without psychotic features: Secondary | ICD-10-CM

## 2023-04-02 MED ORDER — FLUOXETINE HCL 40 MG PO CAPS: 40.0000 mg | ORAL_CAPSULE | Freq: Every day | ORAL | 1 refills | Status: DC

## 2023-04-02 MED ORDER — BUSPIRONE HCL 5 MG PO TABS: 5.0000 mg | ORAL_TABLET | Freq: Two times a day (BID) | ORAL | 1 refills | Status: DC

## 2023-04-02 NOTE — Assessment & Plan Note (Signed)
Patient with some improvement since increasing to 30 mg Prozac.  Not fully treated yet.  Will increase to 40 mg and add BuSpar 5 mg twice a day.  Follow-up in approximately 5 weeks.

## 2023-04-02 NOTE — Telephone Encounter (Signed)
Contacted pt and changed to virtual.     Copied from CRM [id number]. Topic: Appointments - Scheduling Inquiry for Clinic >> Apr 02, 2023  7:49 AM Gaetano Hawthorne wrote: Reason for CRM: Patient is out of the town in [place] and was wondering if their appointment today could be converted into a virtual one or if needed, they can reschedule. Appointment is at 10:10 am with Dr. Constance Goltz today.

## 2023-05-30 ENCOUNTER — Other Ambulatory Visit: Payer: Self-pay | Admitting: Family Medicine

## 2023-05-31 ENCOUNTER — Ambulatory Visit: Payer: Medicaid Other | Admitting: Dermatology

## 2023-06-28 ENCOUNTER — Other Ambulatory Visit: Payer: Self-pay | Admitting: Family Medicine

## 2023-07-27 ENCOUNTER — Other Ambulatory Visit: Payer: Self-pay | Admitting: Family Medicine

## 2023-08-07 ENCOUNTER — Other Ambulatory Visit: Payer: Self-pay | Admitting: Family Medicine

## 2023-08-07 DIAGNOSIS — Z6830 Body mass index (BMI) 30.0-30.9, adult: Secondary | ICD-10-CM

## 2023-08-25 ENCOUNTER — Other Ambulatory Visit: Payer: Self-pay | Admitting: Family Medicine

## 2023-09-22 ENCOUNTER — Other Ambulatory Visit: Payer: Self-pay | Admitting: Family Medicine

## 2023-10-21 ENCOUNTER — Other Ambulatory Visit: Payer: Self-pay | Admitting: Family Medicine

## 2024-02-10 ENCOUNTER — Other Ambulatory Visit: Payer: Self-pay | Admitting: Family Medicine

## 2024-02-10 DIAGNOSIS — Z6830 Body mass index (BMI) 30.0-30.9, adult: Secondary | ICD-10-CM

## 2024-04-11 ENCOUNTER — Other Ambulatory Visit: Payer: Self-pay | Admitting: Family Medicine

## 2024-04-11 NOTE — Telephone Encounter (Signed)
 LVM for her to return call to schedule appt last appt was 03/2023.

## 2024-04-13 ENCOUNTER — Other Ambulatory Visit: Payer: Self-pay | Admitting: Family Medicine

## 2024-04-14 NOTE — Telephone Encounter (Signed)
 LVM for her to return call to schedule appt last appt was 03/2023.
